# Patient Record
Sex: Female | Born: 1990 | Race: Black or African American | Hispanic: No | Marital: Single | Smoking: Never smoker
Health system: Southern US, Community
[De-identification: ages and names within clinical notes are randomized; demographics above are authoritative.]

## PROBLEM LIST (undated history)

## (undated) DIAGNOSIS — F419 Anxiety disorder, unspecified: Secondary | ICD-10-CM

## (undated) DIAGNOSIS — I890 Lymphedema, not elsewhere classified: Secondary | ICD-10-CM

## (undated) DIAGNOSIS — N83209 Unspecified ovarian cyst, unspecified side: Secondary | ICD-10-CM

## (undated) DIAGNOSIS — Z9889 Other specified postprocedural states: Secondary | ICD-10-CM

## (undated) DIAGNOSIS — Z9089 Acquired absence of other organs: Secondary | ICD-10-CM

## (undated) DIAGNOSIS — F32A Depression, unspecified: Secondary | ICD-10-CM

## (undated) HISTORY — DX: Anxiety disorder, unspecified: F41.9

## (undated) HISTORY — DX: Depression, unspecified: F32.A

## (undated) HISTORY — PX: TONSILLECTOMY: SUR1361

## (undated) HISTORY — PX: OVARIAN CYST REMOVAL: SHX89

## (undated) HISTORY — PX: REDUCTION MAMMAPLASTY: SUR839

## (undated) HISTORY — PX: BREAST REDUCTION SURGERY: SHX8

---
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
## 2011-12-31 ENCOUNTER — Ambulatory Visit: Payer: Self-pay

## 2012-09-15 ENCOUNTER — Ambulatory Visit: Payer: Self-pay | Admitting: Family Medicine

## 2012-09-15 LAB — URINALYSIS, COMPLETE
Bilirubin,UR: NEGATIVE
Glucose,UR: NEGATIVE mg/dL (ref 0–75)
Leukocyte Esterase: NEGATIVE
Nitrite: NEGATIVE
Protein: NEGATIVE
Specific Gravity: 1.01 (ref 1.003–1.030)

## 2012-09-15 LAB — WET PREP, GENITAL

## 2013-02-28 ENCOUNTER — Emergency Department: Payer: Self-pay | Admitting: Emergency Medicine

## 2013-02-28 LAB — COMPREHENSIVE METABOLIC PANEL
Alkaline Phosphatase: 90 U/L (ref 50–136)
BUN: 10 mg/dL (ref 7–18)
Bilirubin,Total: 0.3 mg/dL (ref 0.2–1.0)
Calcium, Total: 8.8 mg/dL (ref 8.5–10.1)
EGFR (African American): >60
Glucose: 92 mg/dL (ref 65–99)
Osmolality: 276 (ref 275–301)
SGOT(AST): 20 U/L (ref 15–37)
SGPT (ALT): 28 U/L (ref 12–78)

## 2013-02-28 LAB — URINALYSIS, COMPLETE
Blood: NEGATIVE
Glucose,UR: NEGATIVE mg/dL (ref 0–75)
Leukocyte Esterase: NEGATIVE
Nitrite: NEGATIVE
Ph: 8 (ref 4.5–8.0)
RBC,UR: <1 /HPF (ref 0–5)
Specific Gravity: 1.018 (ref 1.003–1.030)
Squamous Epithelial: 5
WBC UR: 6 /HPF (ref 0–5)

## 2013-02-28 LAB — CBC
HCT: 39.5 % (ref 35.0–47.0)
MCH: 30.1 pg (ref 26.0–34.0)
MCV: 92 fL (ref 80–100)
Platelet: 250 10*3/uL (ref 150–440)
RBC: 4.31 10*6/uL (ref 3.80–5.20)
RDW: 13.3 % (ref 11.5–14.5)
WBC: 10.9 10*3/uL (ref 3.6–11.0)

## 2013-02-28 LAB — LIPASE, BLOOD: Lipase: 135 U/L (ref 73–393)

## 2013-03-04 ENCOUNTER — Emergency Department: Payer: Self-pay | Admitting: Emergency Medicine

## 2013-03-04 LAB — URINALYSIS, COMPLETE
Bacteria: NONE SEEN
Glucose,UR: NEGATIVE mg/dL (ref 0–75)
RBC,UR: 277 /HPF (ref 0–5)
Specific Gravity: 1.008 (ref 1.003–1.030)
Squamous Epithelial: 2
WBC UR: 1 /HPF (ref 0–5)

## 2013-03-04 LAB — COMPREHENSIVE METABOLIC PANEL
Albumin: 3.9 g/dL (ref 3.4–5.0)
Alkaline Phosphatase: 107 U/L (ref 50–136)
Anion Gap: 4 — ABNORMAL LOW (ref 7–16)
BUN: 10 mg/dL (ref 7–18)
Bilirubin,Total: 0.3 mg/dL (ref 0.2–1.0)
Calcium, Total: 8.3 mg/dL — ABNORMAL LOW (ref 8.5–10.1)
Chloride: 105 mmol/L (ref 98–107)
Co2: 26 mmol/L (ref 21–32)
Creatinine: 0.71 mg/dL (ref 0.60–1.30)
EGFR (Non-African Amer.): >60
Glucose: 98 mg/dL (ref 65–99)
SGPT (ALT): 97 U/L — ABNORMAL HIGH (ref 12–78)
Total Protein: 8.8 g/dL — ABNORMAL HIGH (ref 6.4–8.2)

## 2013-03-04 LAB — LIPASE, BLOOD: Lipase: 184 U/L (ref 73–393)

## 2013-03-04 LAB — CBC
HCT: 41.6 % (ref 35.0–47.0)
MCH: 29.9 pg (ref 26.0–34.0)
MCV: 92 fL (ref 80–100)
Platelet: 292 10*3/uL (ref 150–440)
RBC: 4.53 10*6/uL (ref 3.80–5.20)
WBC: 13.9 10*3/uL — ABNORMAL HIGH (ref 3.6–11.0)

## 2013-03-05 LAB — PREGNANCY, URINE: Pregnancy Test, Urine: NEGATIVE m[IU]/mL

## 2014-05-07 ENCOUNTER — Ambulatory Visit: Payer: Self-pay | Admitting: Physician Assistant

## 2014-05-07 LAB — CBC WITH DIFFERENTIAL/PLATELET
Basophil #: 0.1 10*3/uL (ref 0.0–0.1)
Basophil %: 0.8 %
EOS ABS: 0.5 10*3/uL (ref 0.0–0.7)
EOS PCT: 4.4 %
HCT: 39.2 % (ref 35.0–47.0)
HGB: 12.8 g/dL (ref 12.0–16.0)
Lymphocyte #: 2.3 10*3/uL (ref 1.0–3.6)
Lymphocyte %: 21.9 %
MCH: 29.7 pg (ref 26.0–34.0)
MCHC: 32.7 g/dL (ref 32.0–36.0)
MCV: 91 fL (ref 80–100)
MONOS PCT: 7.1 %
Monocyte #: 0.8 x10 3/mm (ref 0.2–0.9)
Neutrophil #: 6.9 10*3/uL — ABNORMAL HIGH (ref 1.4–6.5)
Neutrophil %: 65.8 %
Platelet: 250 10*3/uL (ref 150–440)
RBC: 4.32 10*6/uL (ref 3.80–5.20)
RDW: 13.8 % (ref 11.5–14.5)
WBC: 10.5 10*3/uL (ref 3.6–11.0)

## 2014-05-17 ENCOUNTER — Ambulatory Visit: Payer: Self-pay | Admitting: Podiatry

## 2014-05-19 ENCOUNTER — Emergency Department: Payer: Self-pay | Admitting: Emergency Medicine

## 2014-05-19 LAB — CBC
HCT: 41.7 % (ref 35.0–47.0)
HGB: 13.3 g/dL (ref 12.0–16.0)
MCH: 29.3 pg (ref 26.0–34.0)
MCHC: 31.9 g/dL — ABNORMAL LOW (ref 32.0–36.0)
MCV: 92 fL (ref 80–100)
Platelet: 277 10*3/uL (ref 150–440)
RBC: 4.53 10*6/uL (ref 3.80–5.20)
RDW: 13.7 % (ref 11.5–14.5)
WBC: 13 10*3/uL — ABNORMAL HIGH (ref 3.6–11.0)

## 2014-05-19 LAB — BASIC METABOLIC PANEL
Anion Gap: 6 — ABNORMAL LOW (ref 7–16)
BUN: 10 mg/dL (ref 7–18)
Calcium, Total: 9.2 mg/dL (ref 8.5–10.1)
Chloride: 103 mmol/L (ref 98–107)
Co2: 28 mmol/L (ref 21–32)
Creatinine: 0.88 mg/dL (ref 0.60–1.30)
Glucose: 93 mg/dL (ref 65–99)
OSMOLALITY: 273 (ref 275–301)
POTASSIUM: 4 mmol/L (ref 3.5–5.1)
Sodium: 137 mmol/L (ref 136–145)

## 2014-05-19 LAB — D-DIMER(ARMC): D-Dimer: 185 ng/ml

## 2014-05-19 LAB — TROPONIN I: Troponin-I: <0.02 ng/mL

## 2015-02-01 IMAGING — CT CT ANGIO CHEST
2 of 6 series · 18 of 36 positions shown · IV contrast (APPLIED)
Comparison: DG CHEST 2V dated 05/19/2014

CLINICAL DATA: Chest pain, worst on the left side

EXAM:
CT ANGIOGRAPHY CHEST WITH CONTRAST
TECHNIQUE: Multidetector CT imaging of the chest was performed using the
standard protocol during bolus administration of intravenous
contrast. Multiplanar CT image reconstructions and MIPs were
obtained to evaluate the vascular anatomy.
CONTRAST:  100 cc Isovue 370 IV contrast

[Series 5: pe 1.0 thins · axial · 0.72mm/px · z∈[+796,+1020]mm · 17 of 254 slices shown]
[im 15/254  lung]
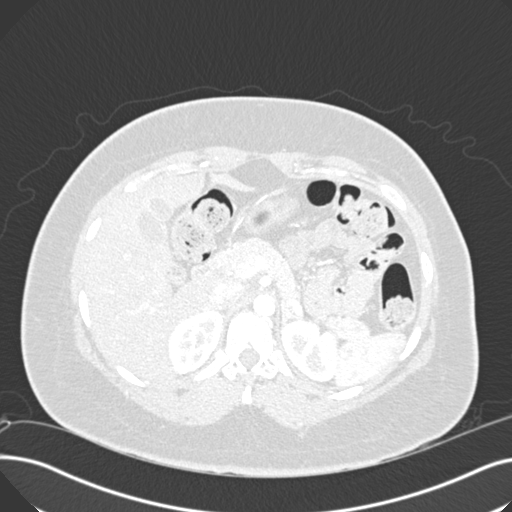
[im 29/254  mediastinal]
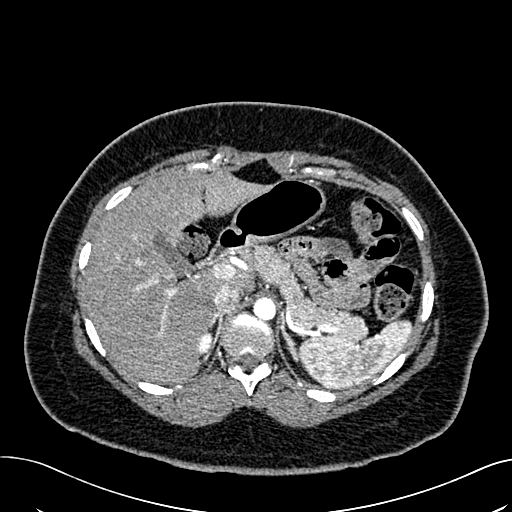
[im 43/254  lung]
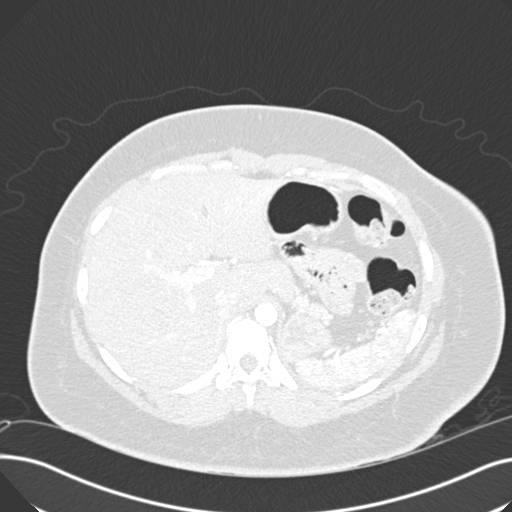
[im 57/254  mediastinal]
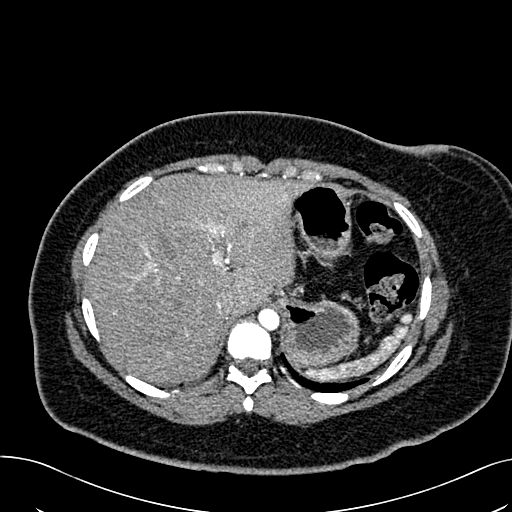
[im 71/254  lung]
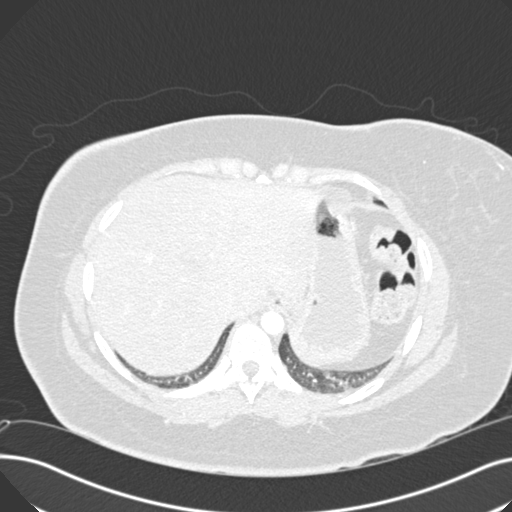
[im 85/254  mediastinal]
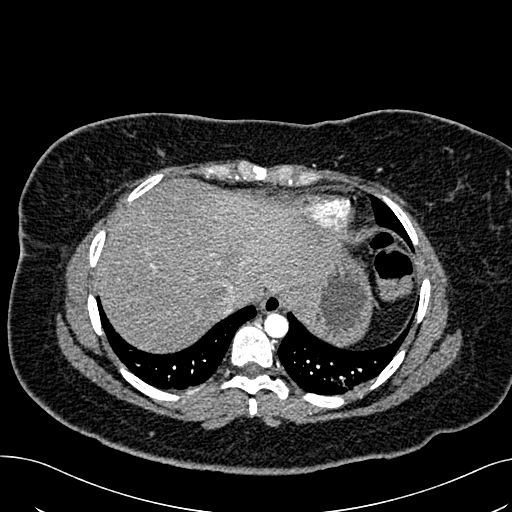
[im 99/254  lung]
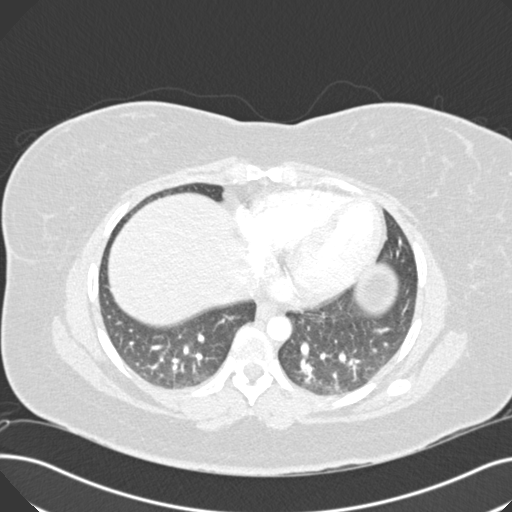
[im 113/254  mediastinal]
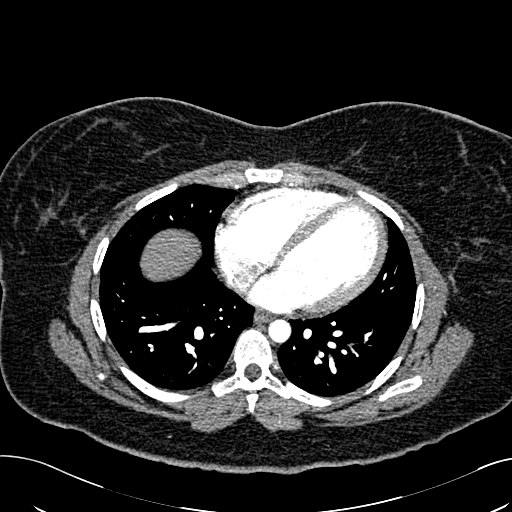
[im 127/254  lung]
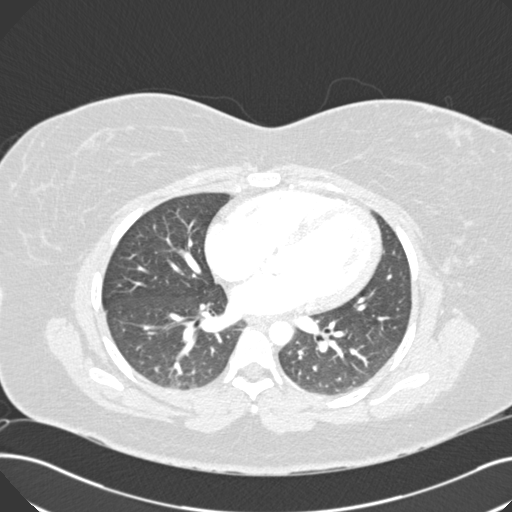
[im 141/254  mediastinal]
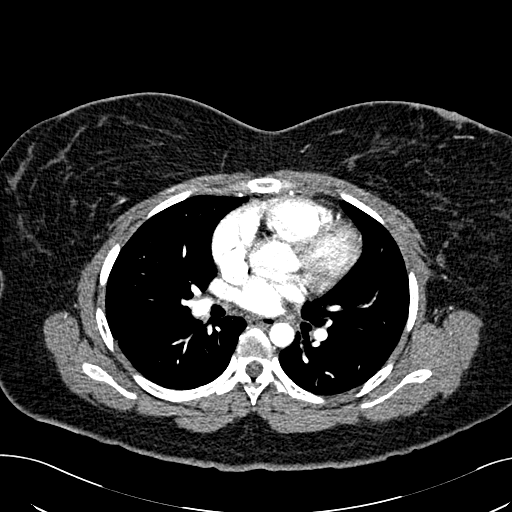
[im 155/254  lung]
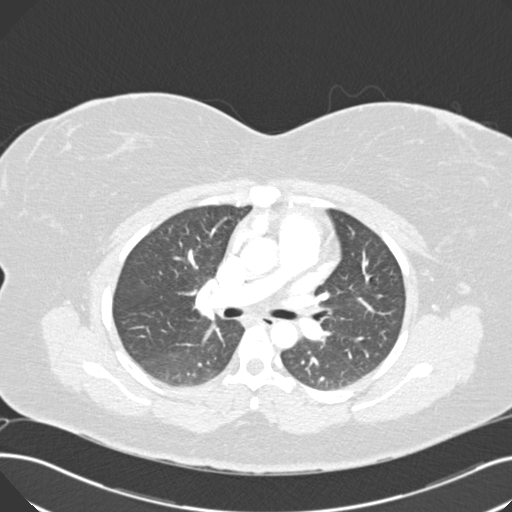
[im 169/254  mediastinal]
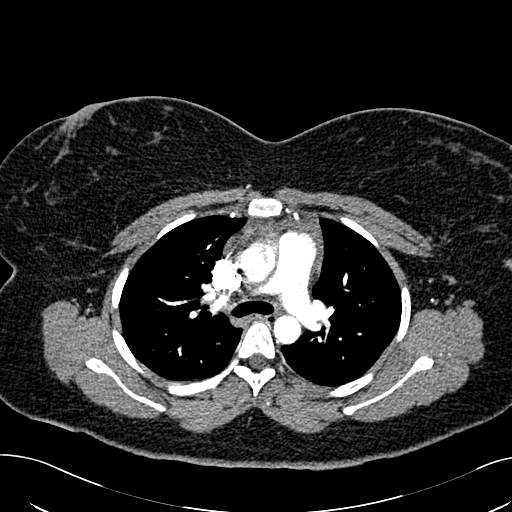
[im 183/254  lung]
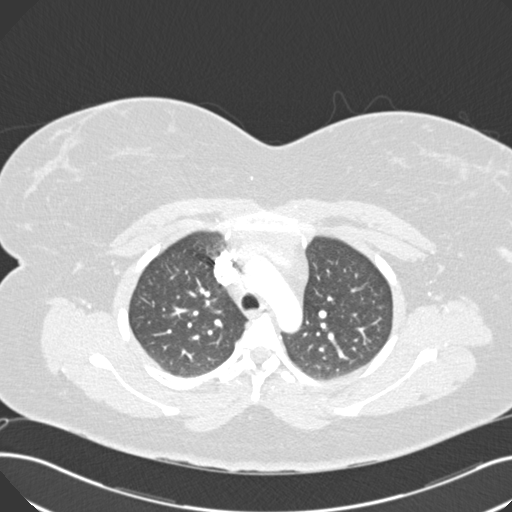
[im 197/254  mediastinal]
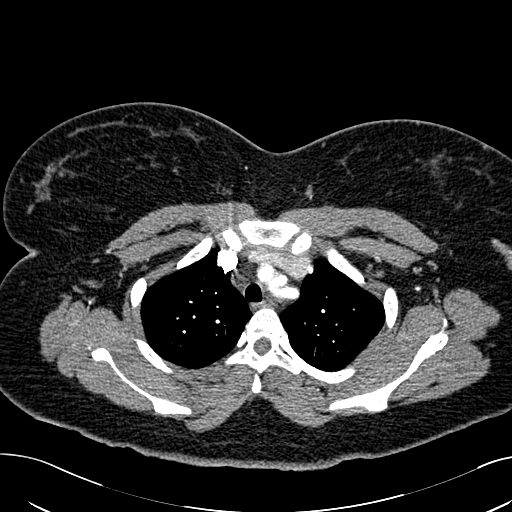
[im 211/254  lung]
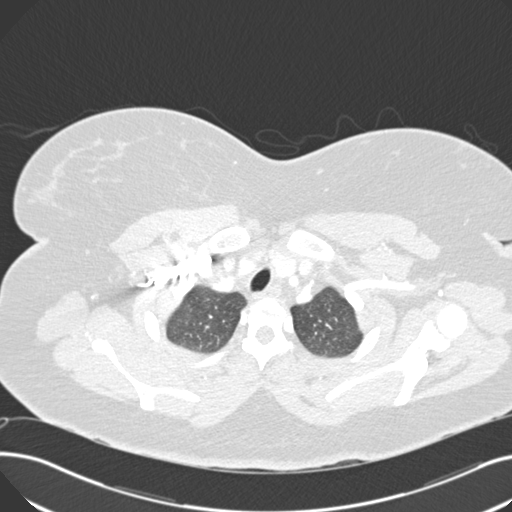
[im 225/254  mediastinal]
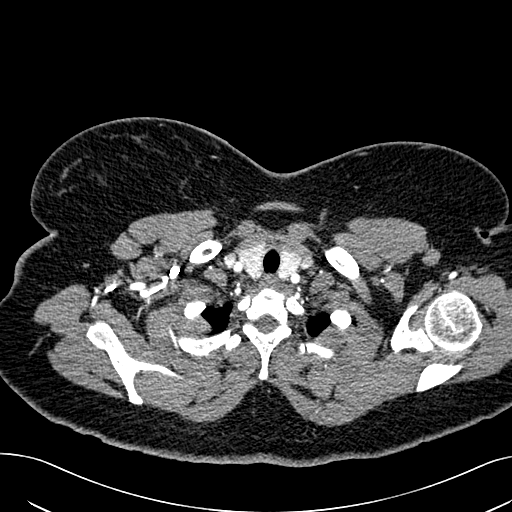
[im 239/254  lung]
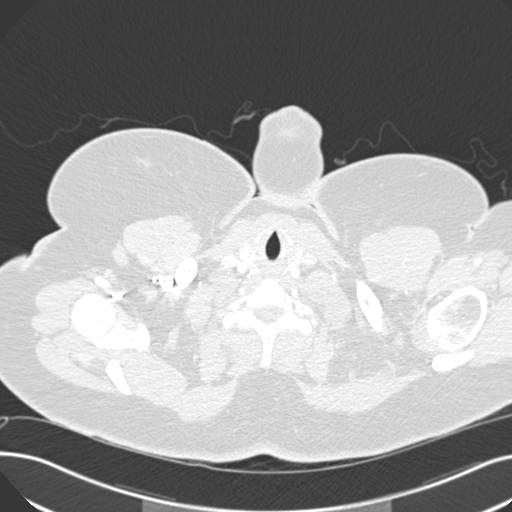

[Series 7: cor pe 2.0 mpr · coronal · 0.48mm/px · 1 of 151 slices shown]
[im 76/151  mediastinal]
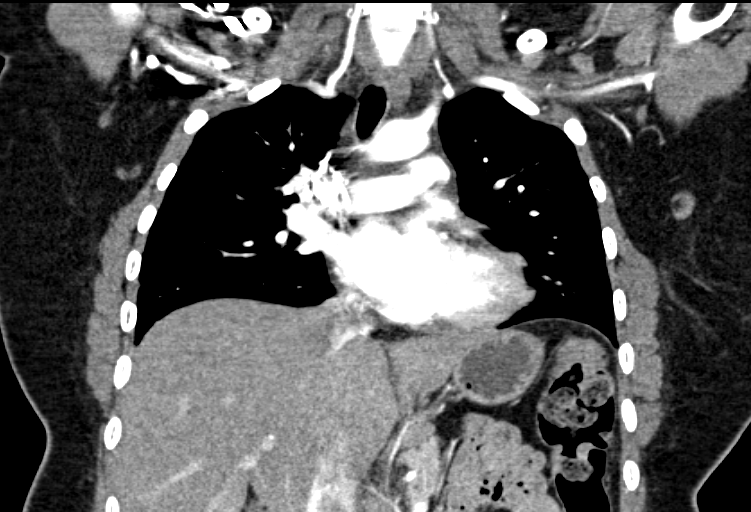

[18 of 36 positions shown; findings below may reference images not displayed]

FINDINGS: Trace fluid in the superior pericardial recess is identified.
Triangular soft tissue density within the anterior mediastinum is
compatible with residual thymus in this young patient. Heart size is
normal. No pleural effusion. No lymphadenopathy. Study is suboptimal
for evaluation for pulmonary embolism due to early bolus timing.
Allowing for this, there is no central focal filling defect
identified in either main pulmonary artery to suggest arterial
embolism.

Minimal dependent bibasilar atelectasis is noted. No consolidation,
mass, or nodule is identified. No acute osseous abnormality.

Review of the MIP images confirms the above findings.
IMPRESSION: No acute cardiopulmonary process. No CT evidence for central
pulmonary arterial filling defect to suggest pulmonary embolism.

## 2015-09-20 ENCOUNTER — Encounter: Payer: Self-pay | Admitting: Emergency Medicine

## 2015-09-20 ENCOUNTER — Ambulatory Visit
Admission: EM | Admit: 2015-09-20 | Discharge: 2015-09-20 | Disposition: A | Discharge status: Home / Self Care | Payer: Self-pay | Attending: Family Medicine | Admitting: Family Medicine

## 2015-09-20 ENCOUNTER — Ambulatory Visit
Admission: RE | Admit: 2015-09-20 | Discharge: 2015-09-20 | Disposition: A | Discharge status: Home / Self Care | Payer: Self-pay | Source: Ambulatory Visit | Attending: Family Medicine | Admitting: Family Medicine

## 2015-09-20 DIAGNOSIS — R1031 Right lower quadrant pain: Secondary | ICD-10-CM | POA: Insufficient documentation

## 2015-09-20 DIAGNOSIS — N83201 Unspecified ovarian cyst, right side: Secondary | ICD-10-CM

## 2015-09-20 DIAGNOSIS — N8320 Unspecified ovarian cysts: Secondary | ICD-10-CM

## 2015-09-20 DIAGNOSIS — E282 Polycystic ovarian syndrome: Secondary | ICD-10-CM | POA: Diagnosis present

## 2015-09-20 HISTORY — DX: Acquired absence of other organs: Z90.89

## 2015-09-20 HISTORY — DX: Unspecified ovarian cyst, unspecified side: N83.209

## 2015-09-20 HISTORY — DX: Lymphedema, not elsewhere classified: I89.0

## 2015-09-20 HISTORY — DX: Other specified postprocedural states: Z98.890

## 2015-09-20 MED ORDER — ACETAMINOPHEN 500 MG PO TABS: 1000.0000 mg | ORAL_TABLET | Freq: Four times a day (QID) | ORAL | Status: DC | PRN

## 2015-09-20 NOTE — ED Notes (Signed)
Pt with right side abd pain x 5  days

## 2015-09-20 NOTE — Discharge Instructions (Signed)
Abdominal Pain, Women  Abdominal (stomach, pelvic, or belly) pain can be caused by many things. It is important to tell your doctor:   The location of the pain.   Does it come and go or is it present all the time?   Are there things that start the pain (eating certain foods, exercise)?   Are there other symptoms associated with the pain (fever, nausea, vomiting, diarrhea)?  All of this is helpful to know when trying to find the cause of the pain.  CAUSES    Stomach: virus or bacteria infection, or ulcer.   Intestine: appendicitis (inflamed appendix), regional ileitis (Crohn's disease), ulcerative colitis (inflamed colon), irritable bowel syndrome, diverticulitis (inflamed diverticulum of the colon), or cancer of the stomach or intestine.   Gallbladder disease or stones in the gallbladder.   Kidney disease, kidney stones, or infection.   Pancreas infection or cancer.   Fibromyalgia (pain disorder).   Diseases of the female organs:   Uterus: fibroid (non-cancerous) tumors or infection.   Fallopian tubes: infection or tubal pregnancy.   Ovary: cysts or tumors.   Pelvic adhesions (scar tissue).   Endometriosis (uterus lining tissue growing in the pelvis and on the pelvic organs).   Pelvic congestion syndrome (female organs filling up with blood just before the menstrual period).   Pain with the menstrual period.   Pain with ovulation (producing an egg).   Pain with an IUD (intrauterine device, birth control) in the uterus.   Cancer of the female organs.   Functional pain (pain not caused by a disease, may improve without treatment).   Psychological pain.   Depression.  DIAGNOSIS   Your doctor will decide the seriousness of your pain by doing an examination.   Blood tests.   X-rays.   Ultrasound.   CT scan (computed tomography, special type of X-ray).   MRI (magnetic resonance imaging).   Cultures, for infection.   Barium enema (dye inserted in the large intestine, to better view it with  X-rays).   Colonoscopy (looking in intestine with a lighted tube).   Laparoscopy (minor surgery, looking in abdomen with a lighted tube).   Major abdominal exploratory surgery (looking in abdomen with a large incision).  TREATMENT   The treatment will depend on the cause of the pain.    Many cases can be observed and treated at home.   Over-the-counter medicines recommended by your caregiver.   Prescription medicine.   Antibiotics, for infection.   Birth control pills, for painful periods or for ovulation pain.   Hormone treatment, for endometriosis.   Nerve blocking injections.   Physical therapy.   Antidepressants.   Counseling with a psychologist or psychiatrist.   Minor or major surgery.  HOME CARE INSTRUCTIONS    Do not take laxatives, unless directed by your caregiver.   Take over-the-counter pain medicine only if ordered by your caregiver. Do not take aspirin because it can cause an upset stomach or bleeding.   Try a clear liquid diet (broth or water) as ordered by your caregiver. Slowly move to a bland diet, as tolerated, if the pain is related to the stomach or intestine.   Have a thermometer and take your temperature several times a day, and record it.   Bed rest and sleep, if it helps the pain.   Avoid sexual intercourse, if it causes pain.   Avoid stressful situations.   Keep your follow-up appointments and tests, as your caregiver orders.   If the pain does   not go away with medicine or surgery, you may try:   Acupuncture.   Relaxation exercises (yoga, meditation).   Group therapy.   Counseling.  SEEK MEDICAL CARE IF:    You notice certain foods cause stomach pain.   Your home care treatment is not helping your pain.   You need stronger pain medicine.   You want your IUD removed.   You feel faint or lightheaded.   You develop nausea and vomiting.   You develop a rash.   You are having side effects or an allergy to your medicine.  SEEK IMMEDIATE MEDICAL CARE IF:    Your  pain does not go away or gets worse.   You have a fever.   Your pain is felt only in portions of the abdomen. The right side could possibly be appendicitis. The left lower portion of the abdomen could be colitis or diverticulitis.   You are passing blood in your stools (bright red or black tarry stools, with or without vomiting).   You have blood in your urine.   You develop chills, with or without a fever.   You pass out.  MAKE SURE YOU:    Understand these instructions.   Will watch your condition.   Will get help right away if you are not doing well or get worse.  Document Released: 10/12/2007 Document Revised: 05/01/2014 Document Reviewed: 11/01/2009  [employer] Patient Information 2015 [employer]. This information is not intended to replace advice given to you by your health care provider. Make sure you discuss any questions you have with your health care provider.          Ovarian Cyst  An ovarian cyst is a fluid-filled sac that forms on an ovary. The ovaries are small organs that produce eggs in women. Various types of cysts can form on the ovaries. Most are not cancerous. Many do not cause problems, and they often go away on their own. Some may cause symptoms and require treatment. Common types of ovarian cysts include:   Functional cysts--These cysts may occur every month during the menstrual cycle. This is normal. The cysts usually go away with the next menstrual cycle if the woman does not get pregnant. Usually, there are no symptoms with a functional cyst.   Endometrioma cysts--These cysts form from the tissue that lines the uterus. They are also called "chocolate cysts" because they become filled with blood that turns brown. This type of cyst can cause pain in the lower abdomen during intercourse and with your menstrual period.   Cystadenoma cysts--This type develops from the cells on the outside of the ovary. These cysts can get very big and cause lower abdomen pain and pain with  intercourse. This type of cyst can twist on itself, cut off its blood supply, and cause severe pain. It can also easily rupture and cause a lot of pain.   Dermoid cysts--This type of cyst is sometimes found in both ovaries. These cysts may contain different kinds of body tissue, such as skin, teeth, hair, or cartilage. They usually do not cause symptoms unless they get very big.   Theca lutein cysts--These cysts occur when too much of a certain hormone (human chorionic gonadotropin) is produced and overstimulates the ovaries to produce an egg. This is most common after procedures used to assist with the conception of a baby (in vitro fertilization).  CAUSES    Fertility drugs can cause a condition in which multiple large cysts are formed on the   ovaries. This is called ovarian hyperstimulation syndrome.   A condition called polycystic ovary syndrome can cause hormonal imbalances that can lead to nonfunctional ovarian cysts.  SIGNS AND SYMPTOMS   Many ovarian cysts do not cause symptoms. If symptoms are present, they may include:   Pelvic pain or pressure.   Pain in the lower abdomen.   Pain during sexual intercourse.   Increasing girth (swelling) of the abdomen.   Abnormal menstrual periods.   Increasing pain with menstrual periods.   Stopping having menstrual periods without being pregnant.  DIAGNOSIS   These cysts are commonly found during a routine or annual pelvic exam. Tests may be ordered to find out more about the cyst. These tests may include:   Ultrasound.   X-ray of the pelvis.   CT scan.   MRI.   Blood tests.  TREATMENT   Many ovarian cysts go away on their own without treatment. Your health care provider may want to check your cyst regularly for 2-3 months to see if it changes. For women in menopause, it is particularly important to monitor a cyst closely because of the higher rate of ovarian cancer in menopausal women. When treatment is needed, it may include any of the following:   A  procedure to drain the cyst (aspiration). This may be done using a long needle and ultrasound. It can also be done through a laparoscopic procedure. This involves using a thin, lighted tube with a tiny camera on the end (laparoscope) inserted through a small incision.   Surgery to remove the whole cyst. This may be done using laparoscopic surgery or an open surgery involving a larger incision in the lower abdomen.   Hormone treatment or birth control pills. These methods are sometimes used to help dissolve a cyst.  HOME CARE INSTRUCTIONS    Only take over-the-counter or prescription medicines as directed by your health care provider.   Follow up with your health care provider as directed.   Get regular pelvic exams and Pap tests.  SEEK MEDICAL CARE IF:    Your periods are late, irregular, or painful, or they stop.   Your pelvic pain or abdominal pain does not go away.   Your abdomen becomes larger or swollen.   You have pressure on your bladder or trouble emptying your bladder completely.   You have pain during sexual intercourse.   You have feelings of fullness, pressure, or discomfort in your stomach.   You lose weight for no apparent reason.   You feel generally ill.   You become constipated.   You lose your appetite.   You develop acne.   You have an increase in body and facial hair.   You are gaining weight, without changing your exercise and eating habits.   You think you are pregnant.  SEEK IMMEDIATE MEDICAL CARE IF:    You have increasing abdominal pain.   You feel sick to your stomach (nauseous), and you throw up (vomit).   You develop a fever that comes on suddenly.   You have abdominal pain during a bowel movement.   Your menstrual periods become heavier than usual.  MAKE SURE YOU:   Understand these instructions.   Will watch your condition.   Will get help right away if you are not doing well or get worse.  Document Released: 12/15/2005 Document Revised: 12/20/2013 Document  Reviewed: 08/22/2013  [employer] Patient Information 2015 [employer]. This information is not intended to replace advice given to you by   your health care provider. Make sure you discuss any questions you have with your health care provider.

## 2015-09-20 NOTE — ED Provider Notes (Signed)
CSN: [id number]     Arrival date & time 09/20/15  1002 History   First MD Initiated Contact with Patient 09/20/15 1250     Chief Complaint  Patient presents with  . Abdominal Pain   (Consider location/radiation/quality/duration/timing/severity/associated sxs/prior Treatment) HPI Comments: African [place] female single here for evaluation of right lower quadrant pain started 5 days ago.  Denied changes with solid intake.  Has noticed with soda intake slight worsening pain that worsens and then relieves.  Tried ice pack, tylenol and motrin 2 doses without relief.  First episode occurred a couple months ago resolved.  Was told had ovarian cyst size of golf ball.  This weekend had diarrhea x 2 occurrences Sunday and Monday (18/19 Sep) took immodium now having constipation.  Vaginal discharge clear light Sunday 16 Sep 2015.  Left leg lymphedema since removal of left ovarian cyst age 64.  Patient reported history of PCOS.    Patient is a 24 y.o. female presenting with abdominal pain. The history is provided by the patient.  Abdominal Pain Pain location:  RLQ Pain quality: aching   Pain quality: not bloating, not burning, not cramping, not dull, no fullness, not gnawing, not heavy, no pressure, not sharp, not shooting, not squeezing, no stiffness, not tearing, not throbbing and not tugging   Pain radiates to:  Does not radiate Pain severity:  Moderate Onset quality:  Sudden Duration:  5 days Timing:  Constant Progression:  Unchanged Chronicity:  Recurrent Context: previous surgery   Context: not alcohol use, not awakening from sleep, not diet changes, not eating, not laxative use, not medication withdrawal, not recent illness, not recent sexual activity, not recent travel, not retching, not sick contacts, not suspicious food intake and not trauma   Relieved by:  Nothing Worsened by:  Palpation and movement Ineffective treatments:  Bowel activity, acetaminophen, NSAIDs, not moving, position  changes, urination, palpation, movement, lying down, eating and liquids Associated symptoms: constipation, diarrhea and vaginal discharge   Associated symptoms: no anorexia, no belching, no chest pain, no chills, no cough, no dysuria, no fatigue, no fever, no flatus, no hematemesis, no hematochezia, no hematuria, no melena, no nausea, no shortness of breath, no sore throat, no vaginal bleeding and no vomiting   Diarrhea:    Quality:  Watery   Number of occurrences:  2   Severity:  Moderate   Duration:  2 days   Timing:  Intermittent   Progression:  Resolved Vaginal discharge:    Quality:  Watery and clear   Severity:  Mild   Onset quality:  Sudden   Duration:  2 days   Timing:  Intermittent   Progression:  Resolved   Chronicity:  New Risk factors: NSAID use and obesity   Risk factors: no alcohol abuse, no aspirin use, not elderly, has not had multiple surgeries, not pregnant and no recent hospitalization     Past Medical History  Diagnosis Date  . S/P tonsillectomy   . Lymphedema of left leg   . H/O bilateral breast reduction surgery   . Ovarian cyst    Past Surgical History  Procedure Laterality Date  . Tonsillectomy    . Breast surgery    . Ovarian cyst removal     History reviewed. No pertinent family history. Social History  Substance Use Topics  . Smoking status: Never Smoker   . Smokeless tobacco: None  . Alcohol Use: No   OB History    No data available     Review of  Systems  Constitutional: Negative for fever, chills, diaphoresis, activity change, appetite change, fatigue and unexpected weight change.  HENT: Negative for congestion, dental problem, drooling, ear discharge, ear pain, facial swelling, hearing loss, sore throat, trouble swallowing and voice change.   Eyes: Negative for photophobia, pain, discharge, redness, itching and visual disturbance.  Respiratory: Negative for apnea, cough, choking, chest tightness, shortness of breath, wheezing and  stridor.   Cardiovascular: Positive for leg swelling. Negative for chest pain and palpitations.  Gastrointestinal: Positive for abdominal pain, diarrhea and constipation. Negative for nausea, vomiting, blood in stool, melena, hematochezia, abdominal distention, anal bleeding, rectal pain, anorexia, flatus and hematemesis.  Endocrine: Negative for cold intolerance and heat intolerance.  Genitourinary: Positive for vaginal discharge and pelvic pain. Negative for dysuria, urgency, frequency, hematuria, flank pain, decreased urine volume, vaginal bleeding, enuresis, difficulty urinating, vaginal pain and menstrual problem.  Musculoskeletal: Negative for myalgias, back pain, joint swelling, arthralgias, gait problem, neck pain and neck stiffness.  Skin: Negative for color change, pallor, rash and wound.  Allergic/Immunologic: Negative for environmental allergies and food allergies.  Neurological: Negative for dizziness, tremors, seizures, syncope, facial asymmetry, speech difficulty, weakness, light-headedness and headaches.  Hematological: Negative for adenopathy. Does not bruise/bleed easily.  Psychiatric/Behavioral: Negative for behavioral problems, confusion, sleep disturbance and agitation.    Allergies  Amoxicillin and Percocet  Home Medications   Prior to Admission medications   Medication Sig Start Date End Date Taking? Authorizing Provider  acetaminophen (TYLENOL) 500 MG tablet Take 2 tablets (1,000 mg total) by mouth every 6 (six) hours as needed for mild pain or moderate pain. 09/20/15   Barbaraann Barthel, NP   Meds Ordered and Administered this Visit  Medications - No data to display  BP 116/80 mmHg  Pulse 70  Temp(Src) 98.6 F (37 C) (Tympanic)  Resp 18  Ht  (1.6 m)  Wt 179 lb (81.194 kg)  BMI 31.72 kg/m2  SpO2 99%  LMP 09/06/2015 No data found.   Physical Exam  Constitutional: She is oriented to person, place, and time. Vital signs are normal. She appears  well-developed and well-nourished. No distress.  HENT:  Head: Normocephalic and atraumatic.  Right Ear: External ear normal.  Left Ear: External ear normal.  Nose: Nose normal.  Mouth/Throat: Oropharynx is clear and moist. No oropharyngeal exudate.  Eyes: Conjunctivae, EOM and lids are normal. Pupils are equal, round, and reactive to light. Right eye exhibits no discharge. Left eye exhibits no discharge. No scleral icterus.  Neck: Trachea normal and normal range of motion. Neck supple. No tracheal deviation present.  Cardiovascular: Normal rate, regular rhythm, normal heart sounds and intact distal pulses.  Exam reveals no gallop and no friction rub.   No murmur heard. Pulmonary/Chest: Effort normal and breath sounds normal. No stridor. No respiratory distress. She has no wheezes. She has no rales.  Abdominal: Soft. Bowel sounds are normal. She exhibits no shifting dullness, no distension, no pulsatile liver, no fluid wave, no abdominal bruit, no ascites, no pulsatile midline mass and no mass. There is no hepatosplenomegaly. There is tenderness in the right upper quadrant and right lower quadrant. There is no rigidity, no rebound, no guarding, no CVA tenderness, no tenderness at McBurney's point and negative Murphy's sign. Hernia confirmed negative in the ventral area, confirmed negative in the right inguinal area and confirmed negative in the left inguinal area.  Dull to percussion x 4 quads; greater tenderness RLQ than RUQ not exquisite  Musculoskeletal: Normal range of motion. She  exhibits edema. She exhibits no tenderness.       Right ankle: Normal.       Left ankle: Normal.       Right lower leg: She exhibits no tenderness, no bony tenderness, no swelling, no edema, no deformity and no laceration.       Left lower leg: She exhibits swelling. She exhibits no tenderness, no bony tenderness, no edema, no deformity and no laceration.  0-1 nonpitting edema LLE  Lymphadenopathy:    She has no  cervical adenopathy.       Right: No inguinal adenopathy present.       Left: No inguinal adenopathy present.  Neurological: She is alert and oriented to person, place, and time. She exhibits normal muscle tone. Coordination normal.  Skin: Skin is warm, dry and intact. No rash noted. She is not diaphoretic. No erythema. No pallor.  Psychiatric: She has a normal mood and affect. Her speech is normal and behavior is normal. Judgment and thought content normal. Cognition and memory are normal.  Nursing note and vitals reviewed. Patient sitting on exam table reading phone.  Position changes easily sitting, standing to lying on back for exams.   ED Course  Procedures (including critical care time)  Labs Review Labs Reviewed - No data to display  Imaging Review US Transvaginal Non-ob  09/20/2015   CLINICAL DATA:  Patient with right lower quadrant pain for 6 days. Prior oophorectomy.  EXAM: TRANSABDOMINAL AND TRANSVAGINAL ULTRASOUND OF PELVIS  TECHNIQUE: Both transabdominal and transvaginal ultrasound examinations of the pelvis were performed. Transabdominal technique was performed for global imaging of the pelvis including uterus, ovaries, adnexal regions, and pelvic cul-de-sac. It was necessary to proceed with endovaginal exam following the transabdominal exam to visualize the adnexal structures.  COMPARISON:  CT abdomen pelvis 03/05/2013  FINDINGS: Uterus  Measurements: 9.2 x 4.2 x 5.3 cm. No fibroids or other mass visualized.  Endometrium  Thickness: 14 mm.  No focal abnormality visualized.  Right ovary  Measurements: 4.8 x 2.1 x 3.0 cm. Probable corpus luteum measuring 1.4 cm within the right ovary.  Left ovary  Surgically absent.  Other findings  Trace fluid within the right adnexa.  IMPRESSION: Probable corpus luteum within the right ovary. Small amount of right adnexal free fluid.   Electronically Signed   By: Annia Belt M.D.   On: 09/20/2015 15:48   US Pelvis Complete  09/20/2015   CLINICAL  DATA:  Patient with right lower quadrant pain for 6 days. Prior oophorectomy.  EXAM: TRANSABDOMINAL AND TRANSVAGINAL ULTRASOUND OF PELVIS  TECHNIQUE: Both transabdominal and transvaginal ultrasound examinations of the pelvis were performed. Transabdominal technique was performed for global imaging of the pelvis including uterus, ovaries, adnexal regions, and pelvic cul-de-sac. It was necessary to proceed with endovaginal exam following the transabdominal exam to visualize the adnexal structures.  COMPARISON:  CT abdomen pelvis 03/05/2013  FINDINGS: Uterus  Measurements: 9.2 x 4.2 x 5.3 cm. No fibroids or other mass visualized.  Endometrium  Thickness: 14 mm.  No focal abnormality visualized.  Right ovary  Measurements: 4.8 x 2.1 x 3.0 cm. Probable corpus luteum measuring 1.4 cm within the right ovary.  Left ovary  Surgically absent.  Other findings  Trace fluid within the right adnexa.  IMPRESSION: Probable corpus luteum within the right ovary. Small amount of right adnexal free fluid.   Electronically Signed   By: Annia Belt M.D.   On: 09/20/2015 15:48   1610 Contacted by radiology [place] tech with  results for patient scan.  Discussed results with patient via telephone.  Continue plan of care as previously discussed.  Tylenol  po QID prn pain.  Heat/ice pack prn.  Patient to follow up with [facility] or Gyn for routine care.  Consider birth control pills/hormonal birth control to help with PCOS as recurrent ovarian cysts per patient verbal medical history starting age 89.  ER if vaginal bleeding, hematemesis, worsening abdomen pain, hematochezia, hematuria, fever greater than 100.5  Discussed ovulation typically two weeks prior to menses.  Patient verbalized understanding of information/instructions, agreed with plan of care and had no further questions at this time.   MDM   1. Right lower quadrant abdominal pain   2. RLQ abdominal pain   3. Cyst of right ovary    Patient refused pregnancy test, CBC today.   Requested [place].  Scheduled for 1430 today at [facility] as no [place] tech available [place] facility today/within 24 hours.  Denied sexual activity >30 days.  LMP 06 Sep 2015.  Negative for fever.  Work excuse note for yesterday, today and tomorrow given to patient.  Avoid heavy lifting or strenuous activity.  Discussed with patient differential diagnosis infection, ectopic pregnancy, cancer, ovarian cyst, appendicitis, cystitis.  Exitcare handout on abdomen pain and ovarian cyst given to patient.  Tylenol  po QID prn pain.  Ice/hot packs prn comfort.  Stop immodium use. Increase fluid/fiber intake to help with constipation.  ER if vaginal bleeding, worsening abdomen pain, hematemesis, hematochezia black or red, hematuria, fever greater than 100.5.  Patient verbalized understanding of information/instructions, agreed with plan of care and had no further questions at this time.    Barbaraann Barthel, NP 09/20/15 1949

## 2015-11-10 ENCOUNTER — Encounter: Payer: Self-pay | Admitting: Internal Medicine

## 2015-11-10 DIAGNOSIS — D6800 Von Willebrand disease, unspecified: Secondary | ICD-10-CM | POA: Insufficient documentation

## 2016-01-10 ENCOUNTER — Encounter: Payer: Self-pay | Admitting: Obstetrics and Gynecology

## 2016-10-17 ENCOUNTER — Encounter: Payer: Self-pay | Admitting: Internal Medicine

## 2016-10-17 DIAGNOSIS — K769 Liver disease, unspecified: Secondary | ICD-10-CM | POA: Insufficient documentation

## 2016-10-20 ENCOUNTER — Encounter: Payer: Self-pay | Admitting: Internal Medicine

## 2016-10-20 ENCOUNTER — Ambulatory Visit (INDEPENDENT_AMBULATORY_CARE_PROVIDER_SITE_OTHER): Payer: Self-pay | Admitting: Internal Medicine

## 2016-10-20 VITALS — BP 122/82 | HR 85 | Resp 16 | Ht 63.0 in | Wt 182.0 lb

## 2016-10-20 DIAGNOSIS — R102 Pelvic and perineal pain: Secondary | ICD-10-CM

## 2016-10-20 DIAGNOSIS — K769 Liver disease, unspecified: Secondary | ICD-10-CM

## 2016-10-20 DIAGNOSIS — K7689 Other specified diseases of liver: Secondary | ICD-10-CM

## 2016-10-20 MED ORDER — MELOXICAM 15 MG PO TABS: 15.0000 mg | ORAL_TABLET | Freq: Every day | ORAL | 0 refills | Status: DC

## 2016-10-20 NOTE — Progress Notes (Signed)
Date:  10/20/2016   Name:  Mallory Johnston   DOB:  1991/12/14   MRN:  [id number]   Chief Complaint: Abdominal Pain (Morphine amd Zofran and Dilaudid in ER now Using moms essential oils instead of meds ER gave. Can not eat causes nausea.Follow up ER 10/15/2016 lesion on liver found on CT while looking for cyst on ovary. )  Abdominal Pain  This is a new problem. The current episode started in the past 7 days. The onset quality is sudden. The problem occurs constantly. The problem has been gradually improving. The pain is located in the suprapubic region. The pain is at a severity of 4/10. The pain is mild. The quality of the pain is colicky and aching. Associated symptoms include nausea. Pertinent negatives include no diarrhea, fever, frequency, headaches, hematuria or vomiting. The pain is aggravated by bowel movement and certain positions. PCOS  She is using essential oils rather than reglan and bendryl ordered by ER. She is not taking any nsaids due to being told years ago that she had Von Willibrand's disease and could not take these.  She denies any nose bleeds or other abnormal bleeding. ER CT showed a right lobe liver lesion 1.2 cm which needs follow up.  Review of Systems  Constitutional: Negative for chills, fatigue and fever.  HENT: Negative for nosebleeds.   Respiratory: Negative for cough, chest tightness, shortness of breath and wheezing.   Cardiovascular: Negative for chest pain, palpitations and leg swelling.  Gastrointestinal: Positive for abdominal pain and nausea. Negative for diarrhea and vomiting.  Genitourinary: Negative for frequency and hematuria.  Skin: Negative for rash.  Neurological: Negative for headaches.    Patient Active Problem List   Diagnosis Date Noted  . Liver lesion, right lobe 10/17/2016  . Von Willebrand disease (HCC) 11/10/2015  . PCOS (polycystic ovarian syndrome) 09/20/2015    Prior to Admission medications   Not on File    Allergies    Allergen Reactions  . Amoxicillin Rash  . Percocet [Oxycodone-Acetaminophen] Rash    Past Surgical History:  Procedure Laterality Date  . BREAST REDUCTION SURGERY    . OVARIAN CYST REMOVAL    . TONSILLECTOMY      Social History  Substance Use Topics  . Smoking status: Never Smoker  . Smokeless tobacco: Never Used  . Alcohol use No     Medication list has been reviewed and updated.   Physical Exam  Constitutional: She is oriented to person, place, and time. She appears well-developed. No distress.  HENT:  Head: Normocephalic and atraumatic.  Cardiovascular: Normal rate, regular rhythm and normal heart sounds.   Pulmonary/Chest: Effort normal and breath sounds normal. No respiratory distress.  Abdominal: Soft. Normal appearance. Bowel sounds are decreased. There is tenderness in the suprapubic area. There is no rigidity, no guarding and no CVA tenderness.  Musculoskeletal: Normal range of motion.  Neurological: She is alert and oriented to person, place, and time.  Skin: Skin is warm and dry. No rash noted.  Psychiatric: She has a normal mood and affect. Her behavior is normal. Thought content normal.  Nursing note and vitals reviewed.   BP 122/82   Pulse 85   Resp 16   Ht 5\' 3"  (1.6 m)   Wt 182 lb (82.6 kg)   LMP 10/06/2016 (Exact Date)   SpO2 99%   BMI 32.24 kg/m   Assessment and Plan: 1. Pelvic pain Probably due to ruptured ovarian cyst (free fluid seen on CT but  US negative) Begin nsaids briefly with food (5-7 days) Follow up with GYN of choice - meloxicam (MOBIC) 15 MG tablet; Take 1 tablet (15 mg total) by mouth daily. For pelvic pain  Dispense: 30 tablet; Refill: 0  2. Liver lesion, right lobe - MR Abdomen W Wo Contrast; Future   Bari EdwardLaura Cheralyn Oliver, MD [facility]  10/20/2016

## 2016-10-22 ENCOUNTER — Ambulatory Visit: Payer: Self-pay

## 2016-11-03 ENCOUNTER — Ambulatory Visit
Admission: RE | Admit: 2016-11-03 | Discharge: 2016-11-03 | Disposition: A | Discharge status: Home / Self Care | Payer: Self-pay | Source: Ambulatory Visit | Attending: Internal Medicine | Admitting: Internal Medicine

## 2016-11-03 DIAGNOSIS — K7689 Other specified diseases of liver: Secondary | ICD-10-CM | POA: Insufficient documentation

## 2016-11-03 DIAGNOSIS — K769 Liver disease, unspecified: Secondary | ICD-10-CM

## 2016-11-03 MED ORDER — GADOBENATE DIMEGLUMINE 529 MG/ML IV SOLN
18.0000 mL | Freq: Once | INTRAVENOUS | Status: AC | PRN
  Administered 2016-11-03: 18 mL via INTRAVENOUS

## 2017-02-11 ENCOUNTER — Encounter: Payer: Self-pay | Admitting: Obstetrics and Gynecology

## 2017-02-11 ENCOUNTER — Ambulatory Visit (INDEPENDENT_AMBULATORY_CARE_PROVIDER_SITE_OTHER): Payer: BLUE CROSS/BLUE SHIELD | Admitting: Obstetrics and Gynecology

## 2017-02-11 VITALS — BP 113/81 | HR 77 | Ht 63.0 in | Wt 181.3 lb

## 2017-02-11 DIAGNOSIS — Z6830 Body mass index (BMI) 30.0-30.9, adult: Secondary | ICD-10-CM | POA: Insufficient documentation

## 2017-02-11 DIAGNOSIS — D6800 Von Willebrand disease, unspecified: Secondary | ICD-10-CM

## 2017-02-11 DIAGNOSIS — Z3041 Encounter for surveillance of contraceptive pills: Secondary | ICD-10-CM | POA: Diagnosis not present

## 2017-02-11 DIAGNOSIS — Z202 Contact with and (suspected) exposure to infections with a predominantly sexual mode of transmission: Secondary | ICD-10-CM | POA: Diagnosis not present

## 2017-02-11 DIAGNOSIS — N83201 Unspecified ovarian cyst, right side: Secondary | ICD-10-CM | POA: Diagnosis not present

## 2017-02-11 DIAGNOSIS — R102 Pelvic and perineal pain: Secondary | ICD-10-CM | POA: Diagnosis not present

## 2017-02-11 DIAGNOSIS — N949 Unspecified condition associated with female genital organs and menstrual cycle: Secondary | ICD-10-CM

## 2017-02-11 DIAGNOSIS — E6609 Other obesity due to excess calories: Secondary | ICD-10-CM

## 2017-02-11 DIAGNOSIS — Z87410 Personal history of cervical dysplasia: Secondary | ICD-10-CM | POA: Diagnosis not present

## 2017-02-11 MED ORDER — NORETHIN ACE-ETH ESTRAD-FE 1.5-30 MG-MCG PO TABS: 1.0000 | ORAL_TABLET | Freq: Every day | ORAL | 11 refills | Status: DC

## 2017-02-11 NOTE — Progress Notes (Signed)
GYN ENCOUNTER NOTE  Subjective:       Mallory Johnston is a 26 y.o. G25P0010 female is here for gynecologic evaluation of the following issues:  1. Right ovarian cyst  26 year old African-American female, using nothing at this time for contraception, previously on Junel 1/20 0.5/30 for contraception, cycle regulation to control menorrhagia, and suppression of ovarian cysts, presents for evaluation of recurrent right lower quadrant pain. Workup in October 2017 at [facility] demonstrated an MRI which showed a 3.1 cm ovarian cyst on the right area; initial CT scan in the emergency room did not reveal the cyst, as it was thought to have been ruptured. The later MRI was performed in order to evaluate a liver lesion, and the incidental finding of the cyst was noted   Over the past week the patient has noted increased right lower quadrant discomfort with an achiness as well as sharp stabbing pains. She has not experienced any significant abnormal uterine bleeding. She has not been sexually active within the past 6 months. The patient denies vaginal discharge.  Significant health issues include history of von Willebrand's disease. Prior to 2016 she has been worked up for complex ovarian cysts for which she was placed on oral contraceptives to help control the recurrent lesions. . Gynecologic History No LMP recorded. 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 H/O bilateral breast reduction surgery   . Lymphedema of left leg   . Ovarian  cyst   . S/P tonsillectomy     Past Surgical History:  Procedure Laterality Date  . BREAST REDUCTION SURGERY    . OVARIAN CYST REMOVAL    . TONSILLECTOMY      Current Outpatient Prescriptions on File Prior to Visit  Medication Sig Dispense Refill  . meloxicam (MOBIC) 15 MG tablet Take 1 tablet (15 mg total) by mouth daily. For pelvic pain 30 tablet 0   No current facility-administered medications on file prior to visit.     Allergies  Allergen Reactions  . Amoxicillin Rash  . Percocet [Oxycodone-Acetaminophen] Rash    Social History   Social History  . Marital status: Single    Spouse name: N/A  . Number of children: N/A  . Years of education: N/A   Occupational History  . Not on file.   Social History Main Topics  . Smoking status: Never Smoker  . Smokeless tobacco: Never Used  . Alcohol use No  . Drug use: No  . Sexual activity: Not Currently   Other Topics Concern  . Not on file   Social History Narrative  . No narrative on file    Family History  Problem Relation Age of Onset  . Hypertension Father   . Diabetes Mother   . Breast cancer Neg Hx   . Ovarian cancer Neg Hx   . Colon cancer Neg Hx     The following portions of  the patient's history were reviewed and updated as appropriate: allergies, current medications, past family history, past medical history, past social history, past surgical history and problem list.  Review of Systems Review of Systems - General ROS: negative for - chills, fatigue, fever, hot flashes, malaise or night sweats Hematological and Lymphatic ROS: negative for - bleeding problems or swollen lymph nodes Gastrointestinal ROS: negative for - abdominal pain, blood in stools, change in bowel habits and nausea/vomiting Musculoskeletal ROS: negative for - joint pain, muscle pain or muscular weakness Genito-Urinary ROS: negative for - change in menstrual cycle; POSITIVE-right lower quadrant and central cramping pelvic pain; no  UTI symptoms; no vaginal discharge  Objective:   BP 113/81   Pulse 77   Ht 5\' 3"  (1.6 m)   Wt 181 lb 4.8 oz (82.2 kg)   BMI 32.12 kg/m  CONSTITUTIONAL: Well-developed, well-nourished female in no acute distress.  HENT:  Normocephalic, atraumatic.  NECK: Normal range of motion, supple, no masses.  Normal thyroid.  SKIN: Skin is warm and dry. No rash noted. Not diaphoretic. No erythema. No pallor. NEUROLGIC: Alert and oriented to person, place, and time. PSYCHIATRIC: Normal mood and affect. Normal behavior. Normal judgment and thought content. CARDIOVASCULAR:Not Examined RESPIRATORY: Not Examined BREASTS: Not Examined BACK: No CVA tenderness ABDOMEN: Soft, non distended; Non tender.  No Organomegaly. PELVIC:  External Genitalia: Normal  BUS: Normal  Vagina: Normal  Cervix: Normal; minimal cervical motion tenderness  Uterus: Mid plane, normal size and shape, mobile, tender 1/4  Adnexa: Nonpalpable bilaterally; 1/4 tenderness right greater than left  RV: Normal external exam  Bladder: Nontender MUSCULOSKELETAL: Normal range of motion. No tenderness.  No cyanosis, clubbing, or edema.     Assessment:   1. Pelvic pain - US Pelvis Complete; Future - US Transvaginal Non-OB; Future  2. Encounter for surveillance of contraceptive pills-Discontinue medications because of loss of insurance; desires to go back on oral contraceptives  3. Ovarian cyst, right-recently found on MRI; history of complex right ovarian cyst, previously controlled with oral contraceptive cycling - US Pelvis Complete; Future - US Transvaginal Non-OB; Future  4. Uterine tenderness-cannot rule out STD versus endometriosis/adenomyosis  5. History of cervical dysplasia-no Pap smear screening since 2016  6. Possible exposure to STD-recommend GC/chlamydia NAAT  7. Class 1 obesity due to excess calories without serious comorbidity with body mass index (BMI) of 30.0 to 30.9 in adult  8. Von Willebrand's disease  (HCC)     Plan:   1. GC/CT NAAT 2. Continue taking Tylenol for pain 3. Pelvic ultrasound 4. Restart Junel 1.05/27/29 oral contraceptives at the beginning of next cycle 5. Return in 3 months for annual exam and follow-up 6. Results from ultrasound will be made available  A total of 25 minutes were spent face-to-face with the patient during this encounter and over half of that time involved counseling and coordination of care.  Herold HarmsMartin A Keilany Burnette, MD  Note: This dictation was prepared with Dragon dictation along with smaller phrase technology. Any transcriptional errors that result from this process are unintentional.

## 2017-02-11 NOTE — Patient Instructions (Signed)
1. Take Tylenol for pelvic pain 2. Ultrasound is scheduled to assess pelvic pain 3. Begin Junel 1/20 0.5/30 oral contraceptives for birth control, cycle regulation, and contraception 4. Return in 3 months for follow-up and annual exam

## 2017-02-12 ENCOUNTER — Telehealth: Payer: Self-pay | Admitting: Obstetrics and Gynecology

## 2017-02-12 NOTE — Telephone Encounter (Signed)
Pt called for Urine results .

## 2017-02-12 NOTE — Telephone Encounter (Signed)
Pt aware results take 48-72 hours to come back. Will contact when results are in.

## 2017-02-13 LAB — GC/CHLAMYDIA PROBE AMP
Chlamydia trachomatis, NAA: NEGATIVE
NEISSERIA GONORRHOEAE BY PCR: NEGATIVE

## 2017-02-18 ENCOUNTER — Ambulatory Visit (INDEPENDENT_AMBULATORY_CARE_PROVIDER_SITE_OTHER): Payer: BLUE CROSS/BLUE SHIELD

## 2017-02-18 DIAGNOSIS — N83201 Unspecified ovarian cyst, right side: Secondary | ICD-10-CM | POA: Diagnosis not present

## 2017-02-18 DIAGNOSIS — R102 Pelvic and perineal pain: Secondary | ICD-10-CM | POA: Diagnosis not present

## 2017-02-20 ENCOUNTER — Telehealth: Payer: Self-pay | Admitting: Obstetrics and Gynecology

## 2017-02-20 NOTE — Telephone Encounter (Signed)
PT CALLED AND IS REQ A CALL BACK FROM YOU. °

## 2017-02-20 NOTE — Telephone Encounter (Signed)
lmtrc

## 2017-02-20 NOTE — Telephone Encounter (Signed)
Pt states since starting her periods 2 days ago she has been nauseous and an increase in pelvic pain. Pos bm. No uti sx. Mad reviewed u/s - wnl. Tried tylenol which helps some. Advised ibup 800 tid. May add tyelnol q 4h. If pt develops fever, severe abn pain or heavy vaginal bleeding. She is to contact office or go to the er. Pt will contact me for an appt if no better soon. Also reminded pt to take ocp on Sunday which may help pp.

## 2017-02-23 DIAGNOSIS — N739 Female pelvic inflammatory disease, unspecified: Secondary | ICD-10-CM | POA: Diagnosis not present

## 2017-02-23 DIAGNOSIS — R1030 Lower abdominal pain, unspecified: Secondary | ICD-10-CM | POA: Diagnosis not present

## 2017-02-23 DIAGNOSIS — Z885 Allergy status to narcotic agent status: Secondary | ICD-10-CM | POA: Diagnosis not present

## 2017-02-23 DIAGNOSIS — R1031 Right lower quadrant pain: Secondary | ICD-10-CM | POA: Diagnosis not present

## 2017-02-23 DIAGNOSIS — R1032 Left lower quadrant pain: Secondary | ICD-10-CM | POA: Diagnosis not present

## 2017-02-23 DIAGNOSIS — R6883 Chills (without fever): Secondary | ICD-10-CM | POA: Diagnosis not present

## 2017-02-23 DIAGNOSIS — R112 Nausea with vomiting, unspecified: Secondary | ICD-10-CM | POA: Diagnosis not present

## 2017-05-13 ENCOUNTER — Encounter: Payer: BLUE CROSS/BLUE SHIELD | Admitting: Obstetrics and Gynecology

## 2017-05-21 ENCOUNTER — Encounter: Payer: Self-pay | Admitting: Obstetrics and Gynecology

## 2017-05-21 ENCOUNTER — Ambulatory Visit (INDEPENDENT_AMBULATORY_CARE_PROVIDER_SITE_OTHER): Payer: BLUE CROSS/BLUE SHIELD | Admitting: Obstetrics and Gynecology

## 2017-05-21 VITALS — BP 115/73 | HR 90 | Ht 63.0 in | Wt 189.3 lb

## 2017-05-21 DIAGNOSIS — Z87410 Personal history of cervical dysplasia: Secondary | ICD-10-CM

## 2017-05-21 DIAGNOSIS — D6800 Von Willebrand disease, unspecified: Secondary | ICD-10-CM

## 2017-05-21 DIAGNOSIS — E6609 Other obesity due to excess calories: Secondary | ICD-10-CM | POA: Diagnosis not present

## 2017-05-21 DIAGNOSIS — Z01419 Encounter for gynecological examination (general) (routine) without abnormal findings: Secondary | ICD-10-CM

## 2017-05-21 DIAGNOSIS — Z6830 Body mass index (BMI) 30.0-30.9, adult: Secondary | ICD-10-CM | POA: Diagnosis not present

## 2017-05-21 DIAGNOSIS — E66811 Obesity, class 1: Secondary | ICD-10-CM

## 2017-05-21 MED ORDER — NORETHIN ACE-ETH ESTRAD-FE 1.5-30 MG-MCG PO TABS: 1.0000 | ORAL_TABLET | Freq: Every day | ORAL | 3 refills | Status: DC

## 2017-05-21 NOTE — Patient Instructions (Signed)
1. Pap smear is done 2. Self breast awareness is encouraged 3. Screening labs are to be obtained through primary care 4. Safe sex practices encouraged 5. Birth control pills are refilled for 1 year 6. Return in 1 year for annual exam  Health Maintenance, Female Adopting a healthy lifestyle and getting preventive care can go a long way to promote health and wellness. Talk with your health care provider about what schedule of regular examinations is right for you. This is a good chance for you to check in with your provider about disease prevention and staying healthy. In between checkups, there are plenty of things you can do on your own. Experts have done a lot of research about which lifestyle changes and preventive measures are most likely to keep you healthy. Ask your health care provider for more information. Weight and diet Eat a healthy diet  Be sure to include plenty of vegetables, fruits, low-fat dairy products, and lean protein.  Do not eat a lot of foods high in solid fats, added sugars, or salt.  Get regular exercise. This is one of the most important things you can do for your health.  Most adults should exercise for at least 150 minutes each week. The exercise should increase your heart rate and make you sweat (moderate-intensity exercise).  Most adults should also do strengthening exercises at least twice a week. This is in addition to the moderate-intensity exercise. Maintain a healthy weight  Body mass index (BMI) is a measurement that can be used to identify possible weight problems. It estimates body fat based on height and weight. Your health care provider can help determine your BMI and help you achieve or maintain a healthy weight.  For females 42 years of age and older:  A BMI below 18.5 is considered underweight.  A BMI of 18.5 to 24.9 is normal.  A BMI of 25 to 29.9 is considered overweight.  A BMI of 30 and above is considered obese. Watch levels of  cholesterol and blood lipids  You should start having your blood tested for lipids and cholesterol at 28 years of age, then have this test every 5 years.  You may need to have your cholesterol levels checked more often if:  Your lipid or cholesterol levels are high.  You are older than 28 years of age.  You are at high risk for heart disease. Cancer screening Lung Cancer  Lung cancer screening is recommended for adults 12-32 years old who are at high risk for lung cancer because of a history of smoking.  A yearly low-dose CT scan of the lungs is recommended for people who:  Currently smoke.  Have quit within the past 15 years.  Have at least a 30-pack-year history of smoking. A pack year is smoking an average of one pack of cigarettes a day for 1 year.  Yearly screening should continue until it has been 15 years since you quit.  Yearly screening should stop if you develop a health problem that would prevent you from having lung cancer treatment. Breast Cancer  Practice breast self-awareness. This means understanding how your breasts normally appear and feel.  It also means doing regular breast self-exams. Let your health care provider know about any changes, no matter how small.  If you are in your 20s or 30s, you should have a clinical breast exam (CBE) by a health care provider every 1-3 years as part of a regular health exam.  If you are 40 or older,  have a CBE every year. Also consider having a breast X-ray (mammogram) every year.  If you have a family history of breast cancer, talk to your health care provider about genetic screening.  If you are at high risk for breast cancer, talk to your health care provider about having an MRI and a mammogram every year.  Breast cancer gene (BRCA) assessment is recommended for women who have family members with BRCA-related cancers. BRCA-related cancers include:  Breast.  Ovarian.  Tubal.  Peritoneal cancers.  Results of  the assessment will determine the need for genetic counseling and BRCA1 and BRCA2 testing. Cervical Cancer  Your health care provider may recommend that you be screened regularly for cancer of the pelvic organs (ovaries, uterus, and vagina). This screening involves a pelvic examination, including checking for microscopic changes to the surface of your cervix (Pap test). You may be encouraged to have this screening done every 3 years, beginning at age 74.  For women ages 83-65, health care providers may recommend pelvic exams and Pap testing every 3 years, or they may recommend the Pap and pelvic exam, combined with testing for human papilloma virus (HPV), every 5 years. Some types of HPV increase your risk of cervical cancer. Testing for HPV may also be done on women of any age with unclear Pap test results.  Other health care providers may not recommend any screening for nonpregnant women who are considered low risk for pelvic cancer and who do not have symptoms. Ask your health care provider if a screening pelvic exam is right for you.  If you have had past treatment for cervical cancer or a condition that could lead to cancer, you need Pap tests and screening for cancer for at least 20 years after your treatment. If Pap tests have been discontinued, your risk factors (such as having a new sexual partner) need to be reassessed to determine if screening should resume. Some women have medical problems that increase the chance of getting cervical cancer. In these cases, your health care provider may recommend more frequent screening and Pap tests. Colorectal Cancer  This type of cancer can be detected and often prevented.  Routine colorectal cancer screening usually begins at 28 years of age and continues through 28 years of age.  Your health care provider may recommend screening at an earlier age if you have risk factors for colon cancer.  Your health care provider may also recommend using home test  kits to check for hidden blood in the stool.  A small camera at the end of a tube can be used to examine your colon directly (sigmoidoscopy or colonoscopy). This is done to check for the earliest forms of colorectal cancer.  Routine screening usually begins at age 61.  Direct examination of the colon should be repeated every 5-10 years through 28 years of age. However, you may need to be screened more often if early forms of precancerous polyps or small growths are found. Skin Cancer  Check your skin from head to toe regularly.  Tell your health care provider about any new moles or changes in moles, especially if there is a change in a mole's shape or color.  Also tell your health care provider if you have a mole that is larger than the size of a pencil eraser.  Always use sunscreen. Apply sunscreen liberally and repeatedly throughout the day.  Protect yourself by wearing long sleeves, pants, a wide-brimmed hat, and sunglasses whenever you are outside. Heart disease, diabetes,  and high blood pressure  High blood pressure causes heart disease and increases the risk of stroke. High blood pressure is more likely to develop in:  People who have blood pressure in the high end of the normal range (130-139/85-89 mm Hg).  People who are overweight or obese.  People who are African American.  If you are 64-61 years of age, have your blood pressure checked every 3-5 years. If you are 16 years of age or older, have your blood pressure checked every year. You should have your blood pressure measured twice-once when you are at a hospital or clinic, and once when you are not at a hospital or clinic. Record the average of the two measurements. To check your blood pressure when you are not at a hospital or clinic, you can use:  An automated blood pressure machine at a pharmacy.  A home blood pressure monitor.  If you are between 52 years and 68 years old, ask your health care provider if you should  take aspirin to prevent strokes.  Have regular diabetes screenings. This involves taking a blood sample to check your fasting blood sugar level.  If you are at a normal weight and have a low risk for diabetes, have this test once every three years after 28 years of age.  If you are overweight and have a high risk for diabetes, consider being tested at a younger age or more often. Preventing infection Hepatitis B  If you have a higher risk for hepatitis B, you should be screened for this virus. You are considered at high risk for hepatitis B if:  You were born in a country where hepatitis B is common. Ask your health care provider which countries are considered high risk.  Your parents were born in a high-risk country, and you have not been immunized against hepatitis B (hepatitis B vaccine).  You have HIV or AIDS.  You use needles to inject street drugs.  You live with someone who has hepatitis B.  You have had sex with someone who has hepatitis B.  You get hemodialysis treatment.  You take certain medicines for conditions, including cancer, organ transplantation, and autoimmune conditions. Hepatitis C  Blood testing is recommended for:  Everyone born from [birth year] through 1967.  Anyone with known risk factors for hepatitis C. Sexually transmitted infections (STIs)  You should be screened for sexually transmitted infections (STIs) including gonorrhea and chlamydia if:  You are sexually active and are younger than 28 years of age.  You are older than 28 years of age and your health care provider tells you that you are at risk for this type of infection.  Your sexual activity has changed since you were last screened and you are at an increased risk for chlamydia or gonorrhea. Ask your health care provider if you are at risk.  If you do not have HIV, but are at risk, it may be recommended that you take a prescription medicine daily to prevent HIV infection. This is called  pre-exposure prophylaxis (PrEP). You are considered at risk if:  You are sexually active and do not regularly use condoms or know the HIV status of your partner(s).  You take drugs by injection.  You are sexually active with a partner who has HIV. Talk with your health care provider about whether you are at high risk of being infected with HIV. If you choose to begin PrEP, you should first be tested for HIV. You should then be tested every  3 months for as long as you are taking PrEP. Pregnancy  If you are premenopausal and you may become pregnant, ask your health care provider about preconception counseling.  If you may become pregnant, take 400 to 800 micrograms (mcg) of folic acid every day.  If you want to prevent pregnancy, talk to your health care provider about birth control (contraception). Osteoporosis and menopause  Osteoporosis is a disease in which the bones lose minerals and strength with aging. This can result in serious bone fractures. Your risk for osteoporosis can be identified using a bone density scan.  If you are 32 years of age or older, or if you are at risk for osteoporosis and fractures, ask your health care provider if you should be screened.  Ask your health care provider whether you should take a calcium or vitamin D supplement to lower your risk for osteoporosis.  Menopause may have certain physical symptoms and risks.  Hormone replacement therapy may reduce some of these symptoms and risks. Talk to your health care provider about whether hormone replacement therapy is right for you. Follow these instructions at home:  Schedule regular health, dental, and eye exams.  Stay current with your immunizations.  Do not use any tobacco products including cigarettes, chewing tobacco, or electronic cigarettes.  If you are pregnant, do not drink alcohol.  If you are breastfeeding, limit how much and how often you drink alcohol.  Limit alcohol intake to no more  than 1 drink per day for nonpregnant women. One drink equals 12 ounces of beer, 5 ounces of wine, or 1 ounces of hard liquor.  Do not use street drugs.  Do not share needles.  Ask your health care provider for help if you need support or information about quitting drugs.  Tell your health care provider if you often feel depressed.  Tell your health care provider if you have ever been abused or do not feel safe at home. This information is not intended to replace advice given to you by your health care provider. Make sure you discuss any questions you have with your health care provider. Document Released: 06/30/2011 Document Revised: 05/22/2016 Document Reviewed: 09/18/2015 Elsevier Interactive Patient Education  2017 [employer].

## 2017-05-21 NOTE — Progress Notes (Signed)
ANNUAL PREVENTATIVE CARE GYN  ENCOUNTER NOTE  Subjective:       Mallory Johnston is a 26 y.o. 281P0010 female here for a routine annual gynecologic exam.  Current complaints: 1.  none   Patient continues to have regular cycles with birth control pills. This most recent cycle however was heavy for 7 days. She did not miss any pills.  She denies any major interval health issues. Bowel function is normal. Bladder function is normal.   Gynecologic History Patient's last menstrual period was 05/09/2017 (exact date). Contraception: OCP (estrogen/progesterone) Last Pap: 5/23016 neg/neg/neg. Results were: normal Last mammogram: n/a. Results were: Marland Kitchen.  Obstetric History OB History  Gravida Para Term Preterm AB Living  1       1    SAB TAB Ectopic Multiple Live Births  1            # Outcome Date GA Lbr Len/2nd Weight Sex Delivery Anes PTL Lv  1 SAB 2014              Past Medical History:  Diagnosis Date  . H/O bilateral breast reduction surgery   . Lymphedema of left leg   . Ovarian cyst   . S/P tonsillectomy     Past Surgical History:  Procedure Laterality Date  . BREAST REDUCTION SURGERY    . OVARIAN CYST REMOVAL    . TONSILLECTOMY      Current Outpatient Prescriptions on File Prior to Visit  Medication Sig Dispense Refill  . meloxicam (MOBIC) 15 MG tablet Take 1 tablet (15 mg total) by mouth daily. For pelvic pain 30 tablet 0  . norethindrone-ethinyl estradiol-iron (JUNEL FE 1.5/30) 1.5-30 MG-MCG tablet Take 1 tablet by mouth daily. 1 Package 11   No current facility-administered medications on file prior to visit.     Allergies  Allergen Reactions  . Amoxicillin Rash  . Percocet [Oxycodone-Acetaminophen] Rash    Social History   Social History  . Marital status: Single    Spouse name: N/A  . Number of children: N/A  . Years of education: N/A   Occupational History  . Not on file.   Social History Main Topics  . Smoking status: Never Smoker  . Smokeless  tobacco: Never Used  . Alcohol use No  . Drug use: No  . Sexual activity: Not Currently   Other Topics Concern  . Not on file   Social History Narrative  . No narrative on file    Family History  Problem Relation Age of Onset  . Hypertension Father   . Diabetes Mother   . Breast cancer Neg Hx   . Ovarian cancer Neg Hx   . Colon cancer Neg Hx     The following portions of the patient's history were reviewed and updated as appropriate: allergies, current medications, past family history, past medical history, past social history, past surgical history and problem list.  Review of Systems Review of Systems  Constitutional: Negative.   HENT: Negative.   Eyes: Negative.   Respiratory: Negative.   Cardiovascular: Negative.   Gastrointestinal: Negative.   Genitourinary: Negative.   Musculoskeletal: Negative.   Skin: Negative.   Neurological: Negative.   Endo/Heme/Allergies: Negative.   Psychiatric/Behavioral: Negative.       Objective:   BP 115/73   Pulse 90   Ht 5\' 3"  (1.6 m)   Wt 189 lb 4.8 oz (85.9 kg)   LMP 05/09/2017 (Exact Date)   BMI 33.53 kg/m  CONSTITUTIONAL: Well-developed, well-nourished female  in no acute distress.  PSYCHIATRIC: Normal mood and affect. Normal behavior. Normal judgment and thought content. NEUROLGIC: Alert and oriented to person, place, and time. Normal muscle tone coordination. No cranial nerve deficit noted. HENT:  Normocephalic, atraumatic, External right and left ear normal. Oropharynx is clear and moist EYES: Conjunctivae and EOM are normal. No scleral icterus.  NECK: Normal range of motion, supple, no masses.  Normal thyroid.  SKIN: Skin is warm and dry. No rash noted. Not diaphoretic. No erythema. No pallor. CARDIOVASCULAR: Normal heart rate noted, regular rhythm, no murmur. RESPIRATORY: Clear to auscultation bilaterally. Effort and breath sounds normal, no problems with respiration noted. BREASTS: Symmetric in size. No masses,  skin changes, nipple drainage, or lymphadenopathy. Post breast reduction scarring is healed ABDOMEN: Soft,, no distention noted.  No tenderness, rebound or guarding.  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 No tenderness.  No cyanosis, clubbing, or edema.  2+ distal pulses. LYMPHATIC: No Axillary, Supraclavicular, or Inguinal Adenopathy.    Assessment:   Annual gynecologic examination 26 y.o. Contraception: OCP (estrogen/progesterone) [id number] Problem List Items Addressed This Visit    None    Visit Diagnoses    Well woman exam with routine gynecological exam    -  Primary      Plan:  Pap: Pap, Reflex if ASCUS Mammogram: Not Indicated Stool Guaiac Testing:  Not Indicated Labs: thur pcp Routine preventative health maintenance measures emphasized: Exercise/Diet/Weight control, Tobacco Warnings, Alcohol/Substance use risks and Safe Sex Junel FE 1.530 is prescribed/refilled for 1 year Return to Clinic - 1 Year   Crystal Hampton, CMA  Herold Harms, MD  Note: This dictation was prepared with [employer] dictation along with smaller phrase technology. Any transcriptional errors that result from this process are unintentional.

## 2017-05-26 LAB — PAP IG W/ RFLX HPV ASCU: PAP SMEAR COMMENT: 0

## 2017-10-27 ENCOUNTER — Encounter: Payer: Self-pay | Admitting: Internal Medicine

## 2017-10-27 ENCOUNTER — Ambulatory Visit (INDEPENDENT_AMBULATORY_CARE_PROVIDER_SITE_OTHER): Payer: BLUE CROSS/BLUE SHIELD | Admitting: Internal Medicine

## 2017-10-27 VITALS — BP 104/68 | HR 84 | Ht 63.0 in | Wt 193.0 lb

## 2017-10-27 DIAGNOSIS — G4730 Sleep apnea, unspecified: Secondary | ICD-10-CM

## 2017-10-27 NOTE — Progress Notes (Signed)
Date:  10/27/2017   Name:  Mallory Johnston   DOB:  Mar 01, 1991   MRN:  [id number]   Chief Complaint: Insomnia (Trouble staying alseep- boyfriend noticed when sleeping wakes self up when snoring. "its almost like im waking self up because I cant breathe or something." ) Insomnia  Primary symptoms: sleep disturbance.  The current episode started more than one month. The onset quality is undetermined. The problem occurs nightly.  She does snore some but her boyfriend has noticed that she wakes herself up with snoring.  He has not mentioned pauses in breathing.  She has unrefreshing sleep, frequent morning headaches and daytime somnolence.  Epworth score is 15.    Review of Systems  Constitutional: Positive for fatigue. Negative for chills and fever.  Respiratory: Negative for chest tightness, shortness of breath and wheezing.   Cardiovascular: Negative for chest pain and palpitations.  Neurological: Positive for headaches. Negative for dizziness, seizures, syncope and speech difficulty.  Psychiatric/Behavioral: Positive for sleep disturbance. The patient has insomnia.     Patient Active Problem List   Diagnosis Date Noted  . Class 1 obesity due to excess calories without serious comorbidity with body mass index (BMI) of 30.0 to 30.9 in adult 02/11/2017  . History of cervical dysplasia 02/11/2017  . Liver lesion, right lobe 10/17/2016  . Von Willebrand's disease (HCC) 11/10/2015  . PCOS (polycystic ovarian syndrome) 09/20/2015    Prior to Admission medications   Medication Sig Start Date End Date Taking? Authorizing Provider  meloxicam (MOBIC) 15 MG tablet Take 1 tablet (15 mg total) by mouth daily. For pelvic pain 10/20/16   Reubin MilanBerglund, Iseah Plouff H, MD  norethindrone-ethinyl estradiol-iron (JUNEL FE 1.5/30) 1.5-30 MG-MCG tablet Take 1 tablet by mouth daily. 05/21/17   Defrancesco, Prentice DockerMartin A, MD    Allergies  Allergen Reactions  . Amoxicillin Rash  . Percocet [Oxycodone-Acetaminophen]  Rash    Past Surgical History:  Procedure Laterality Date  . BREAST REDUCTION SURGERY    . OVARIAN CYST REMOVAL    . TONSILLECTOMY      Social History  Substance Use Topics  . Smoking status: Never Smoker  . Smokeless tobacco: Never Used  . Alcohol use Yes     Comment: rare     Medication list has been reviewed and updated.  PHQ 2/9 Scores 10/27/2017  PHQ - 2 Score 0    Physical Exam  Constitutional: She is oriented to person, place, and time. She appears well-developed. No distress.  HENT:  Head: Normocephalic and atraumatic.  Neck: Normal range of motion. Neck supple. No thyromegaly present.  Cardiovascular: Normal rate, regular rhythm and normal heart sounds.   Pulmonary/Chest: Effort normal and breath sounds normal. No respiratory distress. She has no wheezes.  Musculoskeletal: She exhibits no edema.  Neurological: She is alert and oriented to person, place, and time.  Skin: Skin is warm and dry. No rash noted.  Psychiatric: She has a normal mood and affect. Her behavior is normal. Thought content normal.  Nursing note and vitals reviewed.   BP 104/68   Pulse 84   Ht 5\' 3"  (1.6 m)   Wt 193 lb (87.5 kg)   LMP 10/19/2017 (Exact Date)   SpO2 99%   BMI 34.19 kg/m   Assessment and Plan: 1. 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   Partially dictated using Animal nutritionistDragon software. Any errors are unintentional.  Bari EdwardLaura Velecia Ovitt, MD  [facility]  10/27/2017

## 2018-06-30 ENCOUNTER — Ambulatory Visit (INDEPENDENT_AMBULATORY_CARE_PROVIDER_SITE_OTHER): Payer: Self-pay | Admitting: Internal Medicine

## 2018-06-30 ENCOUNTER — Encounter: Payer: Self-pay | Admitting: Internal Medicine

## 2018-06-30 VITALS — BP 100/76 | HR 88 | Temp 98.3°F | Resp 16 | Ht 61.0 in | Wt 193.0 lb

## 2018-06-30 DIAGNOSIS — L729 Follicular cyst of the skin and subcutaneous tissue, unspecified: Secondary | ICD-10-CM

## 2018-06-30 NOTE — Progress Notes (Signed)
    Date:  06/30/2018   Name:  Mallory Johnston   DOB:  Dec 18, 1991   MRN:  [id number]   Chief Complaint: Cyst (1 month left axillary) Noticed it about 2 months ago in the bath.  There has been no pain and no drainage. It seems to be getting smaller but still persistent.   Review of Systems  Constitutional: Negative for fever.  Skin: Negative for color change, rash and wound.    Patient Active Problem List   Diagnosis Date Noted  . Class 1 obesity due to excess calories without serious comorbidity with body mass index (BMI) of 30.0 to 30.9 in adult 02/11/2017  . History of cervical dysplasia 02/11/2017  . Liver lesion, right lobe 10/17/2016  . Von Willebrand's disease (HCC) 11/10/2015  . PCOS (polycystic ovarian syndrome) 09/20/2015    Prior to Admission medications   Medication Sig Start Date End Date Taking? Authorizing Provider  norethindrone-ethinyl estradiol-iron (JUNEL FE 1.5/30) 1.5-30 MG-MCG tablet Take 1 tablet by mouth daily. 05/21/17  Yes Defrancesco, Prentice DockerMartin A, MD    Allergies  Allergen Reactions  . Amoxicillin Rash  . Percocet [Oxycodone-Acetaminophen] Rash    Past Surgical History:  Procedure Laterality Date  . BREAST REDUCTION SURGERY    . OVARIAN CYST REMOVAL    . TONSILLECTOMY      Social History   Tobacco Use  . Smoking status: Never Smoker  . Smokeless tobacco: Never Used  Substance Use Topics  . Alcohol use: Yes    Comment: rare  . Drug use: No     Medication list has been reviewed and updated.  Current Meds  Medication Sig  . norethindrone-ethinyl estradiol-iron (JUNEL FE 1.5/30) 1.5-30 MG-MCG tablet Take 1 tablet by mouth daily.    PHQ 2/9 Scores 10/27/2017  PHQ - 2 Score 0    Physical Exam  Constitutional: She is oriented to person, place, and time. She appears well-developed. No distress.  HENT:  Head: Normocephalic and atraumatic.  Pulmonary/Chest: Effort normal. No respiratory distress.  Musculoskeletal: Normal range of motion.   Neurological: She is alert and oriented to person, place, and time.  Skin: Skin is warm and dry. No rash noted.  In left axilla - tiny < pea sized mobile, non tender cystic mass Overlying skin normal  Psychiatric: She has a normal mood and affect. Her behavior is normal. Thought content normal.  Nursing note and vitals reviewed.   BP 100/76   Pulse 88   Temp 98.3 F (36.8 C) (Oral)   Resp 16   Ht 5\' 1"  (1.549 m)   Wt 193 lb (87.5 kg)   LMP 06/01/2018   SpO2 98%   BMI 36.47 kg/m   Assessment and Plan: 1. Cyst of skin Resolving without evidence of infection or other concern Pt reassured - return if enlarging, draining, painful   No orders of the defined types were placed in this encounter.   Partially dictated using Animal nutritionistDragon software. Any errors are unintentional.  Bari EdwardLaura Heleena Miceli, MD [facility]  06/30/2018   There are no diagnoses linked to this encounter.

## 2019-01-20 ENCOUNTER — Emergency Department
Admission: EM | Admit: 2019-01-20 | Discharge: 2019-01-20 | Disposition: A | Discharge status: Home / Self Care | Payer: BLUE CROSS/BLUE SHIELD | Attending: Emergency Medicine | Admitting: Emergency Medicine

## 2019-01-20 ENCOUNTER — Emergency Department: Payer: BLUE CROSS/BLUE SHIELD

## 2019-01-20 ENCOUNTER — Other Ambulatory Visit: Payer: Self-pay

## 2019-01-20 DIAGNOSIS — R102 Pelvic and perineal pain: Secondary | ICD-10-CM | POA: Insufficient documentation

## 2019-01-20 DIAGNOSIS — E282 Polycystic ovarian syndrome: Secondary | ICD-10-CM | POA: Insufficient documentation

## 2019-01-20 LAB — URINALYSIS, COMPLETE (UACMP) WITH MICROSCOPIC
BACTERIA UA: NONE SEEN
Bilirubin Urine: NEGATIVE
Glucose, UA: NEGATIVE mg/dL
Ketones, ur: NEGATIVE mg/dL
NITRITE: NEGATIVE
Protein, ur: 30 mg/dL — AB
RBC / HPF: >50 RBC/hpf — ABNORMAL HIGH (ref 0–5)
SPECIFIC GRAVITY, URINE: 1.016 (ref 1.005–1.030)
pH: 7 (ref 5.0–8.0)

## 2019-01-20 LAB — WET PREP, GENITAL
CLUE CELLS WET PREP: NONE SEEN
Sperm: NONE SEEN
TRICH WET PREP: NONE SEEN
Yeast Wet Prep HPF POC: NONE SEEN

## 2019-01-20 LAB — CBC WITH DIFFERENTIAL/PLATELET
ABS IMMATURE GRANULOCYTES: 0.04 10*3/uL (ref 0.00–0.07)
BASOS ABS: 0 10*3/uL (ref 0.0–0.1)
BASOS PCT: 0 %
Eosinophils Absolute: 0.1 10*3/uL (ref 0.0–0.5)
Eosinophils Relative: 1 %
HCT: 40.5 % (ref 36.0–46.0)
HEMOGLOBIN: 13 g/dL (ref 12.0–15.0)
IMMATURE GRANULOCYTES: 0 %
LYMPHS PCT: 12 %
Lymphs Abs: 1.3 10*3/uL (ref 0.7–4.0)
MCH: 29 pg (ref 26.0–34.0)
MCHC: 32.1 g/dL (ref 30.0–36.0)
MCV: 90.4 fL (ref 80.0–100.0)
MONO ABS: 0.6 10*3/uL (ref 0.1–1.0)
Monocytes Relative: 6 %
NEUTROS ABS: 9 10*3/uL — AB (ref 1.7–7.7)
NEUTROS PCT: 81 %
NRBC: 0 % (ref 0.0–0.2)
PLATELETS: 273 10*3/uL (ref 150–400)
RBC: 4.48 MIL/uL (ref 3.87–5.11)
RDW: 13.5 % (ref 11.5–15.5)
WBC: 11 10*3/uL — AB (ref 4.0–10.5)

## 2019-01-20 LAB — BASIC METABOLIC PANEL
ANION GAP: 8 (ref 5–15)
BUN: 6 mg/dL (ref 6–20)
CHLORIDE: 107 mmol/L (ref 98–111)
CO2: 23 mmol/L (ref 22–32)
Calcium: 9 mg/dL (ref 8.9–10.3)
Creatinine, Ser: 0.7 mg/dL (ref 0.44–1.00)
Glucose, Bld: 102 mg/dL — ABNORMAL HIGH (ref 70–99)
POTASSIUM: 4 mmol/L (ref 3.5–5.1)
Sodium: 138 mmol/L (ref 135–145)

## 2019-01-20 LAB — RAPID HIV SCREEN (HIV 1/2 AB+AG)
HIV 1/2 ANTIBODIES: NONREACTIVE
HIV-1 P24 ANTIGEN - HIV24: NONREACTIVE

## 2019-01-20 LAB — CHLAMYDIA/NGC RT PCR (ARMC ONLY)
Chlamydia Tr: NOT DETECTED
N gonorrhoeae: NOT DETECTED

## 2019-01-20 LAB — POCT PREGNANCY, URINE: PREG TEST UR: NEGATIVE

## 2019-01-20 LAB — GLUCOSE, CAPILLARY: GLUCOSE-CAPILLARY: 80 mg/dL (ref 70–99)

## 2019-01-20 MED ORDER — KETOROLAC TROMETHAMINE 30 MG/ML IJ SOLN
30.0000 mg | Freq: Once | INTRAMUSCULAR | Status: AC
  Administered 2019-01-20: 30 mg via INTRAVENOUS
  Filled 2019-01-20: qty 1

## 2019-01-20 MED ORDER — LIDOCAINE HCL (PF) 1 % IJ SOLN
INTRAMUSCULAR | Status: AC
  Administered 2019-01-20: 0.9 mL
  Filled 2019-01-20: qty 5

## 2019-01-20 MED ORDER — DOXYCYCLINE HYCLATE 100 MG PO CAPS: 100.0000 mg | ORAL_CAPSULE | Freq: Two times a day (BID) | ORAL | 0 refills | Status: AC

## 2019-01-20 MED ORDER — CEFTRIAXONE SODIUM 250 MG IJ SOLR
250.0000 mg | Freq: Once | INTRAMUSCULAR | Status: AC
  Administered 2019-01-20: 250 mg via INTRAMUSCULAR
  Filled 2019-01-20: qty 250

## 2019-01-20 MED ORDER — FENTANYL CITRATE (PF) 100 MCG/2ML IJ SOLN
50.0000 ug | Freq: Once | INTRAMUSCULAR | Status: AC
  Administered 2019-01-20: 50 ug via INTRAVENOUS
  Filled 2019-01-20: qty 2

## 2019-01-20 NOTE — Discharge Instructions (Addendum)
Please seek medical attention for any high fevers, chest pain, shortness of breath, change in behavior, persistent vomiting, bloody stool or any other new or concerning symptoms.  

## 2019-01-20 NOTE — ED Provider Notes (Signed)
[facility] Emergency Department Provider Note   ____________________________________________   I have reviewed the triage vital signs and the nursing notes.   HISTORY  Chief Complaint Abdominal Pain   History limited by: Not Limited   HPI Mallory Johnston is a 28 y.o. female who presents to the emergency department today because of concern for abdominal pain. The pain is located in the suprapubic region. The pain started roughly 8 hours ago. It has been constant since then. It is severe. The patient states that when she tried going to work she started feeling clammy and that she might pass out. The patient states that she is currently menstruating and it has been heavier than normal. The patient has a history of PCOS and states this feels somewhat similar but more severe than the pain she has had with that in the past.   Per medical record review patient has a history of ovarian cyst  Past Medical History:  Diagnosis Date  . H/O bilateral breast reduction surgery   . Lymphedema of left leg   . Ovarian cyst   . S/P tonsillectomy     Patient Active Problem List   Diagnosis Date Noted  . Class 1 obesity due to excess calories without serious comorbidity with body mass index (BMI) of 30.0 to 30.9 in adult 02/11/2017  . History of cervical dysplasia 02/11/2017  . Liver lesion, right lobe 10/17/2016  . Von Willebrand's disease (HCC) 11/10/2015  . PCOS (polycystic ovarian syndrome) 09/20/2015    Past Surgical History:  Procedure Laterality Date  . BREAST REDUCTION SURGERY    . OVARIAN CYST REMOVAL    . TONSILLECTOMY      Prior to Admission medications   Medication Sig Start Date End Date Taking? Authorizing Provider  norethindrone-ethinyl estradiol-iron (JUNEL FE 1.5/30) 1.5-30 MG-MCG tablet Take 1 tablet by mouth daily. 05/21/17   Defrancesco, Prentice DockerMartin A, MD    Allergies Amoxicillin and Percocet [oxycodone-acetaminophen]  Family History  Problem  Relation Age of Onset  . Hypertension Father   . Diabetes Mother   . Breast cancer Neg Hx   . Ovarian cancer Neg Hx   . Colon cancer Neg Hx     Social History Social History   Tobacco Use  . Smoking status: Never Smoker  . Smokeless tobacco: Never Used  Substance Use Topics  . Alcohol use: Yes    Comment: rare  . Drug use: No    Review of Systems Constitutional: No fever/chills Eyes: No visual changes. ENT: No sore throat. Cardiovascular: Denies chest pain. Respiratory: Denies shortness of breath. Gastrointestinal: Positive for suprapubic pain. Genitourinary: Negative for dysuria. Musculoskeletal: Negative for back pain. Skin: Negative for rash. Neurological: Negative for headaches, focal weakness or numbness.  ____________________________________________   PHYSICAL EXAM:  VITAL SIGNS: ED Triage Vitals [01/20/19 1115]  Enc Vitals Group     BP (!) 126/95     Pulse Rate 77     Resp 13     Temp (!) 97.5 F (36.4 C)     Temp Source Oral     SpO2 100 %     Weight 189 lb (85.7 kg)     Height 5\' 1"  (1.549 m)     Head Circumference      Peak Flow      Pain Score 10   Constitutional: Alert and oriented.  Eyes: Conjunctivae are normal.  ENT      Head: Normocephalic and atraumatic.      Nose: No  congestion/rhinnorhea.      Mouth/Throat: Mucous membranes are moist.      Neck: No stridor. Hematological/Lymphatic/Immunilogical: No cervical lymphadenopathy. Cardiovascular: Normal rate, regular rhythm.  No murmurs, rubs, or gallops.  Respiratory: Normal respiratory effort without tachypnea nor retractions. Breath sounds are clear and equal bilaterally. No wheezes/rales/rhonchi. Gastrointestinal: Soft and tender to palpation in the suprapubic region Genitourinary: No external lesions. Small amount of blood in vaginal vault. Moderate cervical motion tenderness Musculoskeletal: Normal range of motion in all extremities. No lower extremity edema. Neurologic:  Normal  speech and language. No gross focal neurologic deficits are appreciated.  Skin:  Skin is warm, dry and intact. No rash noted. Psychiatric: Mood and affect are normal. Speech and behavior are normal. Patient exhibits appropriate insight and judgment.  ____________________________________________    LABS (pertinent positives/negatives)  Upreg negative BMP wnl except glu 102 UA clear, large hgb dipstick, 30 protein, trace leukocyte, >50 rbc, 11-20 wbc CBC wbc 11.0, hgb 13.0, plt 273  ____________________________________________   EKG  None  ____________________________________________    RADIOLOGY  US pelvis No acute abnormality  ____________________________________________   PROCEDURES  Procedures  ____________________________________________   INITIAL IMPRESSION / ASSESSMENT AND PLAN / ED COURSE  Pertinent labs & imaging results that were available during my care of the patient were reviewed by me and considered in my medical decision making (see chart for details).   Patient presented to the emergency department today because of concerns of suprapubic pain.  Patient is currently menstruating.  Patient does have history of PID as well as ovarian cyst.  Ultrasound was obtained which did not show any concerning ovarian finding or cyst.  Additionally pelvic exam was performed with some mild CMT.  Given this finding to have concerns for possible PID.  Will give patient shot of antibiotics here and will give prescription for further antibiotics.  ____________________________________________   FINAL CLINICAL IMPRESSION(S) / ED DIAGNOSES  Final diagnoses:  Pelvic pain in female     Note: This dictation was prepared with Dragon dictation. Any transcriptional errors that result from this process are unintentional     Phineas Semen, MD 01/20/19 [phone]

## 2019-01-20 NOTE — ED Notes (Signed)
Patient verbalized understanding of discharge instructions, no questions. Patient ambulated out of ED with steady gait in no distress.  

## 2019-01-20 NOTE — ED Triage Notes (Signed)
Patient arrives with c/o bilateral lower abdominal pain that started at 3am. Patient stated she started her period at this time. Patient reports feeling clammy at work and decided to come to ED. Patient took 650mg  tylenol.

## 2019-01-25 ENCOUNTER — Encounter: Payer: Self-pay | Admitting: Obstetrics and Gynecology

## 2019-01-25 ENCOUNTER — Ambulatory Visit (INDEPENDENT_AMBULATORY_CARE_PROVIDER_SITE_OTHER): Payer: BLUE CROSS/BLUE SHIELD | Admitting: Obstetrics and Gynecology

## 2019-01-25 VITALS — BP 117/78 | HR 82 | Ht 61.0 in | Wt 183.5 lb

## 2019-01-25 DIAGNOSIS — R102 Pelvic and perineal pain: Secondary | ICD-10-CM

## 2019-01-25 DIAGNOSIS — N920 Excessive and frequent menstruation with regular cycle: Secondary | ICD-10-CM | POA: Diagnosis not present

## 2019-01-25 MED ORDER — NORETHIN ACE-ETH ESTRAD-FE 1-20 MG-MCG PO TABS: 1.0000 | ORAL_TABLET | Freq: Every day | ORAL | 3 refills | Status: DC

## 2019-01-25 MED ORDER — NORETHIN ACE-ETH ESTRAD-FE 1.5-30 MG-MCG PO TABS: 1.0000 | ORAL_TABLET | Freq: Every day | ORAL | 3 refills | Status: DC

## 2019-01-25 NOTE — Progress Notes (Signed)
Patient comes in today for ED follow up. She was seen for abdominal pain. They put her on Doxycycline for pelvic inflammatory disease. She had US done at ED. Pain scale at a 4 now.

## 2019-01-25 NOTE — Progress Notes (Signed)
HPI:      [name] is a 28 y.o. G1P0010 who LMP was Patient's last menstrual period was 01/20/2019.  Subjective:   She presents today after being seen in the emergency department for pelvic pain.  Despite a negative gonorrhea and chlamydia she was diagnosed with pelvic inflammatory disease and begun on doxycycline.  Patient states that she is taking her doxycycline and her pain has decreased to a 4.  She can function with the pain at this time. She states that her periods have always been heavy and over the last year she has not taken OCPs to regulate them.  She states that she has monthly menses but her periods each last 10 to 14 days. She says she has a history of PCO but has never had the typical issues of PCO, no irregular cycles, no excessive hair growth, no issues with oily skin.  And in fact the patient had an ultrasound in the emergency department which was entirely normal showing no evidence of polycystic ovaries.  1 of her ovaries has been surgically removed. A significant note the patient has von Willebrand's disease.    Hx: The following portions of the patient's history were reviewed and updated as appropriate:             She  has a past medical history of H/O bilateral breast reduction surgery, Lymphedema of left leg, Ovarian cyst, and S/P tonsillectomy. She does not have any pertinent problems on file. She  has a past surgical history that includes Tonsillectomy; Breast reduction surgery; and Ovarian cyst removal. Her family history includes Diabetes in her mother; Hypertension in her father. She  reports that she has never smoked. She has never used smokeless tobacco. She reports current alcohol use. She reports that she does not use drugs. She has a current medication list which includes the following prescription(s): doxycycline and norethindrone-ethinyl estradiol. She is allergic to amoxicillin and percocet [oxycodone-acetaminophen].       Review of Systems:   Review of Systems  Constitutional: Denied constitutional symptoms, night sweats, recent illness, fatigue, fever, insomnia and weight loss.  Eyes: Denied eye symptoms, eye pain, photophobia, vision change and visual disturbance.  Ears/Nose/Throat/Neck: Denied ear, nose, throat or neck symptoms, hearing loss, nasal discharge, sinus congestion and sore throat.  Cardiovascular: Denied cardiovascular symptoms, arrhythmia, chest pain/pressure, edema, exercise intolerance, orthopnea and palpitations.  Respiratory: Denied pulmonary symptoms, asthma, pleuritic pain, productive sputum, cough, dyspnea and wheezing.  Gastrointestinal: Denied, gastro-esophageal reflux, melena, nausea and vomiting.  Genitourinary: See HPI for additional information.  Musculoskeletal: Denied musculoskeletal symptoms, stiffness, swelling, muscle weakness and myalgia.  Dermatologic: Denied dermatology symptoms, rash and scar.  Neurologic: Denied neurology symptoms, dizziness, headache, neck pain and syncope.  Psychiatric: Denied psychiatric symptoms, anxiety and depression.  Endocrine: Denied endocrine symptoms including hot flashes and night sweats.   Meds:   Current Outpatient Medications on File Prior to Visit  Medication Sig Dispense Refill  . doxycycline (VIBRAMYCIN) 100 MG capsule Take 1 capsule (100 mg total) by mouth 2 (two) times daily for 14 days. 28 capsule 0   No current facility-administered medications on file prior to visit.     Objective:     Vitals:   01/25/19 0819  BP: 117/78  Pulse: 82              Physical examination   Pelvic:   Vulva: Normal appearance.  No lesions.  Vagina: No lesions or abnormalities noted.  Support: Normal pelvic support.  Urethra No masses tenderness or scarring.  Meatus Normal size without lesions or prolapse.  Cervix: Normal appearance.  No lesions.  Anus: Normal exam.  No lesions.  Perineum: Normal exam.  No lesions.        Bimanual   Uterus:  Mildly  enlarged.  Moderately tender.  Adnexae: No masses.  Non-tender to palpation.  Cul-de-sac: Negative for abnormality.     Assessment:    G1P0010 Patient Active Problem List   Diagnosis Date Noted  . Class 1 obesity due to excess calories without serious comorbidity with body mass index (BMI) of 30.0 to 30.9 in adult 02/11/2017  . History of cervical dysplasia 02/11/2017  . Liver lesion, right lobe 10/17/2016  . Von Willebrand's disease (HCC) 11/10/2015  . PCOS (polycystic ovarian syndrome) 09/20/2015     1. Pelvic pain in female   2. Menorrhagia with regular cycle     Based on her history and her normal ultrasound after being off OCPs for 1 year I question the diagnosis of PCO. I believe that her significant bleeding with menses may be secondary to her von Willebrand's disease. Possible adenomyosis?    Plan:            1.  Patient would like to start OCPs to regulate her cycle and make it lighter.  It is also been shown to improve bleeding in patients with von Willebrand's. OCPs The risks /benefits of OCPs have been explained to the patient in detail.  Product literature has been given to her.  I have instructed her in the use of OCPs and have given her literature reinforcing this information.  I have explained to the patient that OCPs are not as effective for birth control during the first month of use, and that another form of contraception should be used during this time.  Both first-day start and Sunday start have been explained.  The risks and benefits of each was discussed.  She has been made aware of  the fact that other medications may affect the efficacy of OCPs.  I have answered all of her questions, and I believe that she has an understanding of the effectiveness and use of OCPs.  2.  Patient to return for annual examination and Pap smear (patient says she has a history of dysplasia) as well as OCP follow-up in 3.5 months.   Orders No orders of the defined types were  placed in this encounter.    Meds ordered this encounter  Medications  . DISCONTD: norethindrone-ethinyl estradiol-iron (JUNEL FE 1.5/30) 1.5-30 MG-MCG tablet    Sig: Take 1 tablet by mouth daily.    Dispense:  3 Package    Refill:  3  . norethindrone-ethinyl estradiol (JUNEL FE 1/20) 1-20 MG-MCG tablet    Sig: Take 1 tablet by mouth daily.    Dispense:  1 Package    Refill:  3      F/U  Return in about 4 months (around 05/26/2019) for Annual Physical.  Elonda Husky, M.D. 01/25/2019 11:25 AM

## 2019-03-05 ENCOUNTER — Emergency Department
Admission: EM | Admit: 2019-03-05 | Discharge: 2019-03-07 | Disposition: A | Discharge status: Other Acute Inpatient Hospital | Payer: BLUE CROSS/BLUE SHIELD | Source: Home / Self Care | Attending: Emergency Medicine | Admitting: Emergency Medicine

## 2019-03-05 ENCOUNTER — Other Ambulatory Visit: Payer: Self-pay

## 2019-03-05 DIAGNOSIS — Z833 Family history of diabetes mellitus: Secondary | ICD-10-CM | POA: Diagnosis not present

## 2019-03-05 DIAGNOSIS — F332 Major depressive disorder, recurrent severe without psychotic features: Principal | ICD-10-CM | POA: Diagnosis present

## 2019-03-05 DIAGNOSIS — F331 Major depressive disorder, recurrent, moderate: Secondary | ICD-10-CM

## 2019-03-05 DIAGNOSIS — F314 Bipolar disorder, current episode depressed, severe, without psychotic features: Secondary | ICD-10-CM | POA: Diagnosis present

## 2019-03-05 DIAGNOSIS — Z79899 Other long term (current) drug therapy: Secondary | ICD-10-CM

## 2019-03-05 DIAGNOSIS — E282 Polycystic ovarian syndrome: Secondary | ICD-10-CM | POA: Diagnosis not present

## 2019-03-05 DIAGNOSIS — R45851 Suicidal ideations: Secondary | ICD-10-CM | POA: Diagnosis not present

## 2019-03-05 DIAGNOSIS — Z91199 Patient's noncompliance with other medical treatment and regimen due to unspecified reason: Secondary | ICD-10-CM

## 2019-03-05 DIAGNOSIS — Z8249 Family history of ischemic heart disease and other diseases of the circulatory system: Secondary | ICD-10-CM | POA: Diagnosis not present

## 2019-03-05 DIAGNOSIS — Z9119 Patient's noncompliance with other medical treatment and regimen: Secondary | ICD-10-CM

## 2019-03-05 DIAGNOSIS — G4700 Insomnia, unspecified: Secondary | ICD-10-CM | POA: Diagnosis not present

## 2019-03-05 DIAGNOSIS — D680 Von Willebrand's disease: Secondary | ICD-10-CM | POA: Diagnosis present

## 2019-03-05 LAB — ETHANOL: Alcohol, Ethyl (B): <10 mg/dL (ref <10)

## 2019-03-05 LAB — URINE DRUG SCREEN, QUALITATIVE (ARMC ONLY)
Amphetamines, Ur Screen: NOT DETECTED
Barbiturates, Ur Screen: NOT DETECTED
Benzodiazepine, Ur Scrn: NOT DETECTED
Cannabinoid 50 Ng, Ur ~~LOC~~: NOT DETECTED
Cocaine Metabolite,Ur ~~LOC~~: NOT DETECTED
MDMA (Ecstasy)Ur Screen: NOT DETECTED
Methadone Scn, Ur: NOT DETECTED
Opiate, Ur Screen: NOT DETECTED
PHENCYCLIDINE (PCP) UR S: NOT DETECTED
Tricyclic, Ur Screen: NOT DETECTED

## 2019-03-05 LAB — CBC
HCT: 39.9 % (ref 36.0–46.0)
Hemoglobin: 12.7 g/dL (ref 12.0–15.0)
MCH: 28.9 pg (ref 26.0–34.0)
MCHC: 31.8 g/dL (ref 30.0–36.0)
MCV: 90.7 fL (ref 80.0–100.0)
PLATELETS: 336 10*3/uL (ref 150–400)
RBC: 4.4 MIL/uL (ref 3.87–5.11)
RDW: 13.2 % (ref 11.5–15.5)
WBC: 10.5 10*3/uL (ref 4.0–10.5)
nRBC: 0 % (ref 0.0–0.2)

## 2019-03-05 LAB — COMPREHENSIVE METABOLIC PANEL
ALT: 15 U/L (ref 0–44)
AST: 21 U/L (ref 15–41)
Albumin: 4.5 g/dL (ref 3.5–5.0)
Alkaline Phosphatase: 65 U/L (ref 38–126)
Anion gap: 8 (ref 5–15)
BUN: 8 mg/dL (ref 6–20)
CALCIUM: 9.2 mg/dL (ref 8.9–10.3)
CO2: 25 mmol/L (ref 22–32)
CREATININE: 0.76 mg/dL (ref 0.44–1.00)
Chloride: 106 mmol/L (ref 98–111)
GFR calc Af Amer: >60 mL/min (ref >60)
GFR calc non Af Amer: >60 mL/min (ref >60)
Glucose, Bld: 96 mg/dL (ref 70–99)
Potassium: 3.4 mmol/L — ABNORMAL LOW (ref 3.5–5.1)
Sodium: 139 mmol/L (ref 135–145)
Total Bilirubin: 0.5 mg/dL (ref 0.3–1.2)
Total Protein: 9 g/dL — ABNORMAL HIGH (ref 6.5–8.1)

## 2019-03-05 LAB — SALICYLATE LEVEL

## 2019-03-05 LAB — ACETAMINOPHEN LEVEL: Acetaminophen (Tylenol), Serum: <10 ug/mL — ABNORMAL LOW (ref 10–30)

## 2019-03-05 LAB — POCT PREGNANCY, URINE: Preg Test, Ur: NEGATIVE

## 2019-03-05 NOTE — ED Notes (Signed)
Pt. States she has been depressed for the past week.  Pt. States SI thoughts w/ plan to overdose on pills.  Pt. States she does not take daily medications and does not see a therapist.  Pt. Does contract for safety.  Pt. Is calm and cooperative at this time.

## 2019-03-05 NOTE — ED Notes (Signed)
Patient's clothing and belongings placed in labeled belongings back and placed in secured area in ED.  Brown colored headband, silver colored earrings, black colored shirt, 1 pair blue jeans, 1 pair black colored boots, pink colored jacket, pink colored purse (patient reports has $9 in cash, 2 bank card and placed cell phone in purse), 1 pair black colored socks, 1 beige colored bra

## 2019-03-05 NOTE — ED Notes (Signed)
POCT results were NEGATIVE  

## 2019-03-05 NOTE — ED Triage Notes (Signed)
Patient reports feeling depressed.  Patient does reports suicidal thoughts with vague plan of overdose with over the counter medications.  Patient does contract for safety with this RN.

## 2019-03-05 NOTE — ED Triage Notes (Signed)
FIRST NURSE NOTE-here for depression. Ambulatory. Not tearful. Calm.

## 2019-03-06 ENCOUNTER — Encounter: Payer: Self-pay | Admitting: Psychiatry

## 2019-03-06 DIAGNOSIS — R45851 Suicidal ideations: Secondary | ICD-10-CM

## 2019-03-06 DIAGNOSIS — Z9119 Patient's noncompliance with other medical treatment and regimen: Secondary | ICD-10-CM

## 2019-03-06 DIAGNOSIS — F314 Bipolar disorder, current episode depressed, severe, without psychotic features: Secondary | ICD-10-CM | POA: Diagnosis present

## 2019-03-06 DIAGNOSIS — Z91199 Patient's noncompliance with other medical treatment and regimen due to unspecified reason: Secondary | ICD-10-CM

## 2019-03-06 HISTORY — DX: Suicidal ideations: R45.851

## 2019-03-06 NOTE — ED Notes (Signed)
Breakfast tray given.  Hand hygiene encouraged.   

## 2019-03-06 NOTE — ED Notes (Signed)
Pt was given a meal tray and a beverage. Pt currently lying in bed asleep. Nothing needed from staff at this time

## 2019-03-06 NOTE — ED Provider Notes (Signed)
Patient is to be admitted to [facility] by Dr. Jennet Maduro.  Attending Physician will be Dr. Toni Amend.   Patient has been assigned to room 303AB, by [facility] Charge Nurse Terri.   Intake Paper Work has been signed and placed on patient chart.  ER staff is aware of the admission:  ER Secretary    Dr. ER MD   Jillyn Hidden  Patient's Nurse    Patient Access.

## 2019-03-06 NOTE — ED Notes (Signed)
Report to include Situation, Background, Assessment, and Recommendations received from Rhea RN. Patient alert and oriented, warm and dry, in no acute distress. Patient denies SI, HI, AVH and pain. Patient made aware of Q15 minute rounds and Rover and Officer presence for their safety. Patient instructed to come to me with needs or concerns.   

## 2019-03-06 NOTE — ED Provider Notes (Signed)
[facility] Emergency Department Provider Note   ____________________________________________   First MD Initiated Contact with Patient 03/05/19 2358     (approximate)  I have reviewed the triage vital signs and the nursing notes.   HISTORY  Chief Complaint Depression    HPI Mallory Johnston is a 28 y.o. female who presents to the ED voluntarily with a chief complaint of depression with suicidal thoughts.  States she has been depressed for the last week.  Thoughts of suicide by overdosing on pills.  Did not actually take any pills.  Voices no medical complaints.       Past Medical History:  Diagnosis Date  . H/O bilateral breast reduction surgery   . Lymphedema of left leg   . Ovarian cyst   . S/P tonsillectomy     Patient Active Problem List   Diagnosis Date Noted  . Class 1 obesity due to excess calories without serious comorbidity with body mass index (BMI) of 30.0 to 30.9 in adult 02/11/2017  . History of cervical dysplasia 02/11/2017  . Liver lesion, right lobe 10/17/2016  . Von Willebrand's disease (HCC) 11/10/2015  . PCOS (polycystic ovarian syndrome) 09/20/2015    Past Surgical History:  Procedure Laterality Date  . BREAST REDUCTION SURGERY    . OVARIAN CYST REMOVAL    . TONSILLECTOMY      Prior to Admission medications   Medication Sig Start Date End Date Taking? Authorizing Provider  norethindrone-ethinyl estradiol (JUNEL FE 1/20) 1-20 MG-MCG tablet Take 1 tablet by mouth daily. 01/25/19   Linzie Collin, MD    Allergies Amoxicillin and Percocet [oxycodone-acetaminophen]  Family History  Problem Relation Age of Onset  . Hypertension Father   . Diabetes Mother   . Breast cancer Neg Hx   . Ovarian cancer Neg Hx   . Colon cancer Neg Hx     Social History Social History   Tobacco Use  . Smoking status: Never Smoker  . Smokeless tobacco: Never Used  Substance Use Topics  . Alcohol use: Yes    Comment: rare  .  Drug use: No    Review of Systems  Constitutional: No fever/chills Eyes: No visual changes. ENT: No sore throat. Cardiovascular: Denies chest pain. Respiratory: Denies shortness of breath. Gastrointestinal: No abdominal pain.  No nausea, no vomiting.  No diarrhea.  No constipation. Genitourinary: Negative for dysuria. Musculoskeletal: Negative for back pain. Skin: Negative for rash. Neurological: Negative for headaches, focal weakness or numbness. Psychiatric:  Positive for depression with SI.  ____________________________________________   PHYSICAL EXAM:  VITAL SIGNS: ED Triage Vitals [03/05/19 1926]  Enc Vitals Group     BP 139/89     Pulse Rate 93     Resp 18     Temp 98.3 F (36.8 C)     Temp Source Oral     SpO2 97 %     Weight 185 lb (83.9 kg)     Height 5\' 1"  (1.549 m)     Head Circumference      Peak Flow      Pain Score 0     Pain Loc      Pain Edu?      Excl. in GC?     Constitutional: Alert and oriented. Well appearing and in no acute distress. Eyes: Conjunctivae are normal. PERRL. EOMI. Head: Atraumatic. Nose: No congestion/rhinnorhea. Mouth/Throat: Mucous membranes are moist.  Oropharynx non-erythematous. Neck: No stridor.   Cardiovascular: Normal rate, regular rhythm. Grossly normal  heart sounds.  Good peripheral circulation. Respiratory: Normal respiratory effort.  No retractions. Lungs CTAB. Gastrointestinal: Soft and nontender. No distention. No abdominal bruits. No CVA tenderness. Musculoskeletal: No lower extremity tenderness nor edema.  No joint effusions. Neurologic:  Normal speech and language. No gross focal neurologic deficits are appreciated. No gait instability. Skin:  Skin is warm, dry and intact. No rash noted. Psychiatric: Mood and affect are flat. Speech and behavior are normal.  ____________________________________________   LABS (all labs ordered are listed, but only abnormal results are displayed)  Labs Reviewed    COMPREHENSIVE METABOLIC PANEL - Abnormal; Notable for the following components:      Result Value   Potassium 3.4 (*)    Total Protein 9.0 (*)    All other components within normal limits  ACETAMINOPHEN LEVEL - Abnormal; Notable for the following components:   Acetaminophen (Tylenol), Serum <10 (*)    All other components within normal limits  ETHANOL  SALICYLATE LEVEL  CBC  URINE DRUG SCREEN, QUALITATIVE ([facility] ONLY)  POC URINE PREG, ED  POCT PREGNANCY, URINE   ____________________________________________  EKG  None ____________________________________________  RADIOLOGY  ED MD interpretation: None  Official radiology report(s): No results found.  ____________________________________________   PROCEDURES  Procedure(s) performed (including Critical Care):  Procedures   ____________________________________________   INITIAL IMPRESSION / ASSESSMENT AND PLAN / ED COURSE  As part of my medical decision making, I reviewed the following data within the electronic MEDICAL RECORD NUMBER Nursing notes reviewed and incorporated, Labs reviewed, Old chart reviewed, A consult was requested and obtained from this/these consultant(s) Psychiatry and Notes from prior ED visits     28 year old female who presents with depression with thoughts of overdosing on pills.  Contracts for safety in the emergency department.  She is medically cleared pending psychiatric evaluation and disposition.   Clinical Course as of Mar 05 641  Wynelle Link Mar 06, 2019  0602 Patient was evaluated by [facility] psychiatrist Dr. Jonelle Sidle who recommends inpatient hospitalization.  Patient will remain voluntary.   [JS]    Clinical Course User Index [JS] Irean Hong, MD     ____________________________________________   FINAL CLINICAL IMPRESSION(S) / ED DIAGNOSES  Final diagnoses:  Moderate episode of recurrent major depressive disorder [facility])     ED Discharge Orders    None       Note:  This document  was prepared using Dragon voice recognition software and may include unintentional dictation errors.   Irean Hong, MD 03/06/19 [phone]

## 2019-03-06 NOTE — ED Notes (Signed)
Hourly rounding reveals patient in room. No complaints, stable, in no acute distress. Q15 minute rounds and monitoring via Rover and Officer to continue.   

## 2019-03-06 NOTE — ED Notes (Signed)
Patient is alert, oriented and ambulatory. Patient denies pain. Patient denies SI/HI and A/V hallucinations. Patient states she came to hospital because she has been depressed. Patient states "I can't handle it anymore."  Patient is calm, tearful and cooperative. Patient provided support and encouragement. Q 15 minute checks in progress and patient remains safe on unit. Monitoring continues.

## 2019-03-06 NOTE — Consult Note (Signed)
Chart reviewed and discussed witTTS. Meets criteria for admission. Orders entered.

## 2019-03-06 NOTE — ED Notes (Signed)
Hourly rounding reveals patient sleeping in room. No complaints, stable, in no acute distress. Q15 minute rounds and monitoring via Rover and Officer to continue.  

## 2019-03-06 NOTE — ED Notes (Signed)
Pt lying in bed watching TV. Pt in no acute distress at this time. Pt was given a beverage per request. Pt refused snack at this time/ Nothing needed from staff at this time

## 2019-03-06 NOTE — BH Assessment (Signed)
Assessment Note  Mallory Johnston is an 28 y.o. female. Mallory Johnston arrived to the ED arrived to the ED by her personal transportation.  She reports that she has depression. She reports, "I am having a lot of family problems. Stress at work, and being overwhelmed".  She states, "My mom is basically done with me". She states that her mother has given up on her.  She reports feelings of sadness. She states, "Maybe it would be better if I wasn't here". She shared that she has a decrease in her sleep, she is eating less, she has a loss of interest in things she previously enjoyed. She is isolating herself.  She reports that she has "a lot" of anxiety and she is feeling as though she is not good enough for people.  She denied having auditory or visual hallucinations.  She reports suicidal ideation with a plan to overdose on pills.  She denied homicidal ideation or intent.  She denied the use of alcohol or drugs.  She reports feelings stressed by her fear of rejection.     TTS spoke with Mallory Johnston [phone]) He states that Mallory Johnston has been talking about hurting herself and worrying about her bills.  He states that he believes that she is tired and shared that she is working 3 jobs. He states that she had a history from age 26 of depressive symptoms and that she has received therapy over the years.  He states that she has improved in the past with treatment and medication.  He reports that she was taking medication.  She has been off her medications for an unknown amount a time.  He reports a prior diagnosis of bipolar disorder.  Diagnosis: Depression  Past Medical History:  Past Medical History:  Diagnosis Date  . H/O bilateral breast reduction surgery   . Lymphedema of left leg   . Ovarian cyst   . S/P tonsillectomy     Past Surgical History:  Procedure Laterality Date  . BREAST REDUCTION SURGERY    . OVARIAN CYST REMOVAL    . TONSILLECTOMY      Family History:  Family History   Problem Relation Age of Onset  . Hypertension Father   . Diabetes Mother   . Breast cancer Neg Hx   . Ovarian cancer Neg Hx   . Colon cancer Neg Hx     Social History:  reports that she has never smoked. She has never used smokeless tobacco. She reports current alcohol use. She reports that she does not use drugs.  Additional Social History:  Alcohol / Drug Use History of alcohol / drug use?: No history of alcohol / drug abuse  CIWA: CIWA-Ar BP: 139/89 Pulse Rate: 93 COWS:    Allergies:  Allergies  Allergen Reactions  . Amoxicillin Rash  . Percocet [Oxycodone-Acetaminophen] Nausea And Vomiting and Rash    Home Medications: (Not in a hospital admission)   OB/GYN Status:  Patient's last menstrual period was 02/18/2019 (approximate).  General Assessment Data Location of Assessment: [facility] ED TTS Assessment: In system Is this a Tele or Face-to-Face Assessment?: Face-to-Face Is this an Initial Assessment or a Re-assessment for this encounter?: Initial Assessment Patient Accompanied by:: N/A Language Other than English: No Living Arrangements: Other (Comment)(Private residence) What gender do you identify as?: Female Marital status: Single Pregnancy Status: No Living Arrangements: Alone Can pt return to current living arrangement?: Yes Admission Status: Voluntary Is patient capable of signing voluntary admission?: Yes Referral Source: Self/Family/Friend Insurance  type: BCBS  Medical Screening Exam [facility] Walk-in ONLY) Medical Exam completed: No Reason for MSE not completed: Other:  Crisis Care Plan Living Arrangements: Alone Legal Guardian: Other:(Self) Name of Psychiatrist: None Name of Therapist: None  Education Status Is patient currently in school?: No Is the patient employed, unemployed or receiving disability?: Employed  Risk to self with the past 6 months Suicidal Ideation: Yes-Currently Present Has patient been a risk to self within the past 6 months  prior to admission? : Yes Suicidal Intent: Yes-Currently Present Has patient had any suicidal intent within the past 6 months prior to admission? : Yes Is patient at risk for suicide?: Yes Suicidal Plan?: Yes-Currently Present Has patient had any suicidal plan within the past 6 months prior to admission? : Yes Specify Current Suicidal Plan: Overdose on pills Access to Means: Yes Specify Access to Suicidal Means: access to medications What has been your use of drugs/alcohol within the last 12 months?: denied use of alcohol or drugs Previous Attempts/Gestures: Yes How many times?: 1 Other Self Harm Risks: denied Triggers for Past Attempts: Unknown Intentional Self Injurious Behavior: None Family Suicide History: No Recent stressful life event(s): Other (Comment)(Work, family concerns) Persecutory voices/beliefs?: No Depression: Yes Depression Symptoms: Tearfulness, Despondent, Feeling worthless/self pity Substance abuse history and/or treatment for substance abuse?: No Suicide prevention information given to non-admitted patients: Not applicable  Risk to Others within the past 6 months Homicidal Ideation: No Does patient have any lifetime risk of violence toward others beyond the six months prior to admission? : No Thoughts of Harm to Others: No Current Homicidal Intent: No Current Homicidal Plan: No Access to Homicidal Means: No Identified Victim: None identified History of harm to others?: No Assessment of Violence: On admission Violent Behavior Description: denied Does patient have access to weapons?: No Criminal Charges Pending?: No Does patient have a court date: No Is patient on probation?: No  Psychosis Hallucinations: None noted Delusions: None noted  Mental Status Report Appearance/Hygiene: In scrubs Eye Contact: Poor Motor Activity: Unremarkable Speech: Logical/coherent, Soft Level of Consciousness: Quiet/awake Mood: Depressed Affect: Flat Anxiety Level:  Minimal Thought Processes: Coherent Judgement: Unimpaired Orientation: Appropriate for developmental age Obsessive Compulsive Thoughts/Behaviors: None  Cognitive Functioning Concentration: Poor Memory: Recent Intact Is patient IDD: No Insight: Fair Impulse Control: Fair Appetite: Poor Have you had any weight changes? : No Change Sleep: Decreased Vegetative Symptoms: Staying in bed  ADLScreening [facility] Assessment Services) Patient's cognitive ability adequate to safely complete daily activities?: Yes Patient able to express need for assistance with ADLs?: Yes Independently performs ADLs?: Yes (appropriate for developmental age)  Prior Inpatient Therapy Prior Inpatient Therapy: No  Prior Outpatient Therapy Prior Outpatient Therapy: Yes Prior Therapy Dates: 2003 Prior Therapy Facilty/Provider(s): [place] Reason for Treatment: Depression, ADHD, SI Does patient have an ACCT team?: No Does patient have Intensive In-House Services?  : No Does patient have [facility] services? : No Does patient have P4CC services?: No  ADL Screening (condition at time of admission) Patient's cognitive ability adequate to safely complete daily activities?: Yes Is the patient deaf or have difficulty hearing?: No Does the patient have difficulty seeing, even when wearing glasses/contacts?: No Does the patient have difficulty concentrating, remembering, or making decisions?: No Patient able to express need for assistance with ADLs?: Yes Does the patient have difficulty dressing or bathing?: No Independently performs ADLs?: Yes (appropriate for developmental age) Does the patient have difficulty walking or climbing stairs?: No Weakness of Legs: None Weakness of Arms/Hands: None  Home  Assistive Devices/Equipment Home Assistive Devices/Equipment: None    Abuse/Neglect Assessment (Assessment to be complete while patient is alone) Abuse/Neglect Assessment Can Be Completed: (Denied by patient)      Advance Directives (For Healthcare) Does Patient Have a Medical Advance Directive?: No          Disposition:  Disposition Initial Assessment Completed for this Encounter: Yes  On Site Evaluation by:   Reviewed with Physician:    Justice Deeds 03/06/2019 12:05 AM

## 2019-03-07 ENCOUNTER — Inpatient Hospital Stay
Admission: AD | Admit: 2019-03-07 | Discharge: 2019-03-09 | DRG: 885 | Disposition: A | Discharge status: Home / Self Care | Payer: BLUE CROSS/BLUE SHIELD | Source: Intra-hospital | Attending: Psychiatry | Admitting: Psychiatry

## 2019-03-07 ENCOUNTER — Other Ambulatory Visit: Payer: Self-pay

## 2019-03-07 DIAGNOSIS — Z79899 Other long term (current) drug therapy: Secondary | ICD-10-CM | POA: Diagnosis not present

## 2019-03-07 DIAGNOSIS — Z833 Family history of diabetes mellitus: Secondary | ICD-10-CM

## 2019-03-07 DIAGNOSIS — F332 Major depressive disorder, recurrent severe without psychotic features: Principal | ICD-10-CM

## 2019-03-07 DIAGNOSIS — Z8249 Family history of ischemic heart disease and other diseases of the circulatory system: Secondary | ICD-10-CM | POA: Diagnosis not present

## 2019-03-07 DIAGNOSIS — D6800 Von Willebrand disease, unspecified: Secondary | ICD-10-CM | POA: Diagnosis present

## 2019-03-07 DIAGNOSIS — G4700 Insomnia, unspecified: Secondary | ICD-10-CM | POA: Diagnosis present

## 2019-03-07 DIAGNOSIS — R45851 Suicidal ideations: Secondary | ICD-10-CM | POA: Diagnosis present

## 2019-03-07 DIAGNOSIS — E282 Polycystic ovarian syndrome: Secondary | ICD-10-CM | POA: Diagnosis present

## 2019-03-07 DIAGNOSIS — F314 Bipolar disorder, current episode depressed, severe, without psychotic features: Secondary | ICD-10-CM | POA: Diagnosis present

## 2019-03-07 MED ORDER — NORETHIN ACE-ETH ESTRAD-FE 1-20 MG-MCG PO TABS: 1.0000 | ORAL_TABLET | Freq: Every day | ORAL | Status: DC

## 2019-03-07 MED ORDER — ALUM & MAG HYDROXIDE-SIMETH 200-200-20 MG/5ML PO SUSP: 30.0000 mL | ORAL | Status: DC | PRN

## 2019-03-07 MED ORDER — HYDROXYZINE HCL 50 MG PO TABS: 50.0000 mg | ORAL_TABLET | Freq: Three times a day (TID) | ORAL | Status: DC | PRN

## 2019-03-07 MED ORDER — MAGNESIUM HYDROXIDE 400 MG/5ML PO SUSP: 30.0000 mL | Freq: Every day | ORAL | Status: DC | PRN

## 2019-03-07 MED ORDER — MIRTAZAPINE 15 MG PO TABS
15.0000 mg | ORAL_TABLET | Freq: Every day | ORAL | Status: DC
  Administered 2019-03-07: 15 mg via ORAL
  Filled 2019-03-07: qty 1

## 2019-03-07 MED ORDER — TRAZODONE HCL 100 MG PO TABS: 100.0000 mg | ORAL_TABLET | Freq: Every evening | ORAL | Status: DC | PRN

## 2019-03-07 NOTE — Progress Notes (Addendum)
Recreation Therapy Notes  Date: 03/07/2019  Time: 9:30 am  Location: Craft Room  Behavioral response: Appropriate  Intervention Topic: Strengths  Discussion/Intervention:  Group content today was focused on strengths. The group identified some of the strengths they have. Individuals stated reason why they do not use their strengths. Patients expressed what strengths others see in them. The group identified important reason to use their strengths. The group participated in the intervention "Picking strengths", where they had a chance to identify some of their strengths. Clinical Observations/Feedback:  Patient came to group and identified kindness and creativity as her strengths. She explained that some of the strengths she could work on is her communication and self-love. Individual was social with peers and staff while participating in group.  Mallory Johnston LRT/CTRS         Jerianne Anselmo 03/07/2019 11:16 AM

## 2019-03-07 NOTE — BHH Suicide Risk Assessment (Signed)
[facility] Admission Suicide Risk Assessment   Nursing information obtained from:  Patient Demographic factors:  Living alone, Low socioeconomic status Current Mental Status:  Suicidal ideation indicated by patient Loss Factors:  Loss of significant relationship Historical Factors:  Prior suicide attempts Risk Reduction Factors:  Employed  Total Time spent with patient: 1 hour Principal Problem: <principal problem not specified> Diagnosis:  Active Problems:   Bipolar I disorder, most recent episode depressed, severe without psychotic features (HCC)  Subjective Data: Patient reports depression which has been gradually getting worse for months.  Recent suicidal thoughts with specific thought of overdosing.  Patient denies any actual wish to die right now.  Not psychotic.  Open to appropriate treatment and cooperative.  Continued Clinical Symptoms:  Alcohol Use Disorder Identification Test Final Score (AUDIT): 0 The "Alcohol Use Disorders Identification Test", Guidelines for Use in Primary Care, Second Edition.  World Science writer [facility]). Score between 0-7:  no or low risk or alcohol related problems. Score between 8-15:  moderate risk of alcohol related problems. Score between 16-19:  high risk of alcohol related problems. Score 20 or above:  warrants further diagnostic evaluation for alcohol dependence and treatment.   CLINICAL FACTORS:   Depression:   Anhedonia   Musculoskeletal: Strength & Muscle Tone: within normal limits Gait & Station: normal Patient leans: Right  Psychiatric Specialty Exam: Physical Exam  Nursing note and vitals reviewed. Constitutional: She appears well-developed and well-nourished.  HENT:  Head: Normocephalic and atraumatic.  Eyes: Pupils are equal, round, and reactive to light. Conjunctivae are normal.  Neck: Normal range of motion.  Cardiovascular: Regular rhythm and normal heart sounds.  Respiratory: Effort normal. No respiratory distress.  GI:  Soft.  Musculoskeletal: Normal range of motion.  Neurological: She is alert.  Skin: Skin is warm and dry.  Psychiatric: Judgment normal. Her affect is blunt. Her speech is delayed. She is slowed and withdrawn. Cognition and memory are normal. She exhibits a depressed mood. She expresses suicidal ideation.    Review of Systems  Constitutional: Negative.   HENT: Negative.   Eyes: Negative.   Respiratory: Negative.   Cardiovascular: Negative.   Gastrointestinal: Negative.   Musculoskeletal: Negative.   Skin: Negative.   Neurological: Negative.   Psychiatric/Behavioral: Positive for depression and suicidal ideas. Negative for hallucinations and substance abuse. The patient is nervous/anxious and has insomnia.     Blood pressure 118/83, pulse 91, temperature 98.6 F (37 C), temperature source Oral, resp. rate 18, height 5\' 1"  (1.549 m), weight 83.9 kg, last menstrual period 02/18/2019, SpO2 100 %.Body mass index is 34.96 kg/m.  General Appearance: Casual  Eye Contact:  Good  Speech:  Clear and Coherent  Volume:  Normal  Mood:  Euthymic  Affect:  Congruent  Thought Process:  Goal Directed  Orientation:  Full (Time, Place, and Person)  Thought Content:  Logical  Suicidal Thoughts:  No  Homicidal Thoughts:  No  Memory:  Immediate;   Fair Recent;   Fair Remote;   Fair  Judgement:  Fair  Insight:  Fair  Psychomotor Activity:  Decreased  Concentration:  Concentration: Fair  Recall:  [employer] of Knowledge:  Fair  Language:  Fair  Akathisia:  No  Handed:  Right  AIMS (if indicated):     Assets:  Desire for Improvement Housing Physical Health Resilience  ADL's:  Intact  Cognition:  WNL  Sleep:  Number of Hours: 3.15      COGNITIVE FEATURES THAT CONTRIBUTE TO RISK:  Polarized  thinking    SUICIDE RISK:   Mild:  Suicidal ideation of limited frequency, intensity, duration, and specificity.  There are no identifiable plans, no associated intent, mild dysphoria and related  symptoms, good self-control (both objective and subjective assessment), few other risk factors, and identifiable protective factors, including available and accessible social support.  PLAN OF CARE: Patient has suicidal thoughts with some planning but is not currently obsessing on it.  Mood is still depressed but she is open to treatment.  Patient will be involved in appropriate groups and activities on the unit.  Medication management will be pursued.  Full evaluation with evaluation of suicidality again prior to discharge.  I certify that inpatient services furnished can reasonably be expected to improve the patient's condition.   Mordecai Rasmussen, MD 03/07/2019, 4:17 PM

## 2019-03-07 NOTE — Plan of Care (Signed)
Patient is newly admitted to the unit. No goals met at this time. Patient observed in bed majority of the morning and early afternoon but she did get up for breakfast/ lunch. Continues to endorse depression but denies current SI/HI/AVH and pain. Goal for today is to try to think more positively and be more active. Encouragement and support provided.  Milieu remains safe with q 15 minute safety checks. Will continue to monitor. 

## 2019-03-07 NOTE — Progress Notes (Signed)
Mallory Johnston is a 28 y.o. female Voluntary admitted for depression and suicide ideation. Pt alert and oriented x 4, calm and cooperative with the admission process. Pt stated she has been depressed and feel like her family will be better without her. Pt has been having SI with intent to OD on medications. Pt stated she is stressed out with family issues, work and financial difficulties.  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 Pt stated she her goal is to get help with abandonment from her mom and rejection from guys. Consents signed, skin/belongings search completed and pt oriented to unit. Pt stable at this time. Pt given the opportunity to express concerns and ask questions. Pt went to bed and safe at this time, will continue to monitor.

## 2019-03-07 NOTE — BHH Group Notes (Signed)
LCSW Group Therapy Note   03/07/2019 1:00 PM  Type of Therapy and Topic:  Group Therapy:  Overcoming Obstacles   Participation Level:  Did Not Attend   Description of Group:    In this group patients will be encouraged to explore what they see as obstacles to their own wellness and recovery. They will be guided to discuss their thoughts, feelings, and behaviors related to these obstacles. The group will process together ways to cope with barriers, with attention given to specific choices patients can make. Each patient will be challenged to identify changes they are motivated to make in order to overcome their obstacles. This group will be process-oriented, with patients participating in exploration of their own experiences as well as giving and receiving support and challenge from other group members.   Therapeutic Goals: 1. Patient will identify personal and current obstacles as they relate to admission. 2. Patient will identify barriers that currently interfere with their wellness or overcoming obstacles.  3. Patient will identify feelings, thought process and behaviors related to these barriers. 4. Patient will identify two changes they are willing to make to overcome these obstacles:      Summary of Patient Progress   X    Therapeutic Modalities:   Cognitive Behavioral Therapy Solution Focused Therapy Motivational Interviewing Relapse Prevention Therapy  Penni Homans, MSW, LCSW 03/07/2019 2:23 PM
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
## 2019-03-07 NOTE — Tx Team (Signed)
Initial Treatment Plan 03/07/2019 1:59 AM Mallory Johnston FXT:[id number]    PATIENT STRESSORS: Financial difficulties Marital or family conflict   PATIENT STRENGTHS: Average or above average intelligence Capable of independent living Physical Health Supportive family/friends   PATIENT IDENTIFIED PROBLEMS: Depression  Anxiety  "Abondomnent by my mom"  "Rejection by guys"               DISCHARGE CRITERIA:  Ability to meet basic life and health needs Improved stabilization in mood, thinking, and/or behavior Medical problems require only outpatient monitoring Motivation to continue treatment in a less acute level of care Verbal commitment to aftercare and medication compliance  PRELIMINARY DISCHARGE PLAN: Attend aftercare/continuing care group Attend PHP/IOP Return to previous living arrangement Return to previous work or school arrangements  PATIENT/FAMILY INVOLVEMENT: This treatment plan has been presented to and reviewed with the patient, Mallory Johnston, and/or family member.  The patient and family have been given the opportunity to ask questions and make suggestions.  Bethann Punches, RN 03/07/2019, 1:59 AM

## 2019-03-07 NOTE — Tx Team (Signed)
Interdisciplinary Treatment and Diagnostic Plan Update  03/07/2019 Time of Session: 230pm Mallory Johnston MRN: [id number]  Principal Diagnosis: <principal problem not specified>  Secondary Diagnoses: Active Problems:   Bipolar I disorder, most recent episode depressed, severe without psychotic features (HCC)   Current Medications:  Current Facility-Administered Medications  Medication Dose Route Frequency Provider Last Rate Last Dose  . alum & mag hydroxide-simeth (MAALOX/MYLANTA) 200-200-20 MG/5ML suspension 30 mL  30 mL Oral Q4H PRN Pucilowska, Jolanta B, MD      . hydrOXYzine (ATARAX/VISTARIL) tablet 50 mg  50 mg Oral TID PRN Pucilowska, Jolanta B, MD      . magnesium hydroxide (MILK OF MAGNESIA) suspension 30 mL  30 mL Oral Daily PRN Pucilowska, Jolanta B, MD      . norethindrone-ethinyl estradiol (JUNEL FE,GILDESS FE,LOESTRIN FE) 1-20 MG-MCG per tablet 1 tablet  1 tablet Oral Daily Pucilowska, Jolanta B, MD      . traZODone (DESYREL) tablet 100 mg  100 mg Oral QHS PRN Pucilowska, Jolanta B, MD       PTA Medications: Medications Prior to Admission  Medication Sig Dispense Refill Last Dose  . norethindrone-ethinyl estradiol (JUNEL FE 1/20) 1-20 MG-MCG tablet Take 1 tablet by mouth daily. 1 Package 3     Patient Stressors: Financial difficulties Marital or family conflict  Patient Strengths: Average or above average intelligence Capable of independent living Physical Health Supportive family/friends  Treatment Modalities: Medication Management, Group therapy, Case management,  1 to 1 session with clinician, Psychoeducation, Recreational therapy.   Physician Treatment Plan for Primary Diagnosis: <principal problem not specified> Long Term Goal(s):     Short Term Goals:    Medication Management: Evaluate patient's response, side effects, and tolerance of medication regimen.  Therapeutic Interventions: 1 to 1 sessions, Unit Group sessions and Medication  administration.  Evaluation of Outcomes: Progressing  Physician Treatment Plan for Secondary Diagnosis: Active Problems:   Bipolar I disorder, most recent episode depressed, severe without psychotic features (HCC)  Long Term Goal(s):     Short Term Goals:       Medication Management: Evaluate patient's response, side effects, and tolerance of medication regimen.  Therapeutic Interventions: 1 to 1 sessions, Unit Group sessions and Medication administration.  Evaluation of Outcomes: Progressing   RN Treatment Plan for Primary Diagnosis: <principal problem not specified> Long Term Goal(s): Knowledge of disease and therapeutic regimen to maintain health will improve  Short Term Goals: Ability to participate in decision making will improve, Ability to verbalize feelings will improve, Ability to disclose and discuss suicidal ideas, Ability to identify and develop effective coping behaviors will improve and Compliance with prescribed medications will improve  Medication Management: RN will administer medications as ordered by provider, will assess and evaluate patient's response and provide education to patient for prescribed medication. RN will report any adverse and/or side effects to prescribing provider.  Therapeutic Interventions: 1 on 1 counseling sessions, Psychoeducation, Medication administration, Evaluate responses to treatment, Monitor vital signs and CBGs as ordered, Perform/monitor CIWA, COWS, AIMS and Fall Risk screenings as ordered, Perform wound care treatments as ordered.  Evaluation of Outcomes: Progressing   LCSW Treatment Plan for Primary Diagnosis: <principal problem not specified> Long Term Goal(s): Safe transition to appropriate next level of care at discharge, Engage patient in therapeutic group addressing interpersonal concerns.  Short Term Goals: Engage patient in aftercare planning with referrals and resources  Therapeutic Interventions: Assess for all discharge  needs, 1 to 1 time with Social worker, Explore available resources and  support systems, Assess for adequacy in community support network, Educate family and significant other(s) on suicide prevention, Complete Psychosocial Assessment, Interpersonal group therapy.  Evaluation of Outcomes: Progressing   Progress in Treatment: Attending groups: Yes. Participating in groups: Yes. Taking medication as prescribed: Yes. Toleration medication: Yes. Family/Significant other contact made: Yes, individual(s) contacted:  Theador Hawthorne, father Patient understands diagnosis: Yes. Discussing patient identified problems/goals with staff: Yes. Medical problems stabilized or resolved: Yes. Denies suicidal/homicidal ideation: Yes. Issues/concerns per patient self-inventory: No. Other: NA  New problem(s) identified: No, Describe:  none reported  New Short Term/Long Term Goal(s):  "To learn to be happy with myself and learn how to deal with stress"  Patient Goals:  "To learn to be happy with myself and learn how to deal with stress"  Discharge Plan or Barriers:  Pt will return home and follow up with outpatient treatment  Reason for Continuation of Hospitalization: Medication stabilization  Estimated Length of Stay: 3-5 days, pt scheduled for d/c on 03/09/19  Attendees: Patient: Mallory Johnston 03/07/2019 3:36 PM  Physician: Mordecai Rasmussen 03/07/2019 3:36 PM  Nursing:  03/07/2019 3:36 PM  RN Care Manager: 03/07/2019 3:36 PM  Social Worker: Lowella Dandy 03/07/2019 3:36 PM  Recreational Therapist: Danella Deis Outlaw 03/07/2019 3:36 PM  Other:  03/07/2019 3:36 PM  Other:  03/07/2019 3:36 PM  Other: 03/07/2019 3:36 PM    Scribe for Treatment Team: Suzan Slick, LCSW 03/07/2019 3:36 PM

## 2019-03-07 NOTE — ED Notes (Signed)
Hourly rounding reveals patient sleeping in room. No complaints, stable, in no acute distress. Q15 minute rounds and monitoring via Rover and Officer to continue.  

## 2019-03-07 NOTE — [facility] Suicide Risk Assessment (Signed)
[facility] INPATIENT:  Family/Significant Other Suicide Prevention Education  Suicide Prevention Education:  Education Completed; Theador Hawthorne, father [phone] has been identified by the patient as the family member/significant other with whom the patient will be residing, and identified as the person(s) who will aid the patient in the event of a mental health crisis (suicidal ideations/suicide attempt).  With written consent from the patient, the family member/significant other has been provided the following suicide prevention education, prior to the and/or following the discharge of the patient.  The suicide prevention education provided includes the following:  Suicide risk factors  Suicide prevention and interventions  National Suicide Hotline telephone number  [facility] assessment telephone number  [facility] Emergency Assistance 911  [facility] and/or Residential Mobile Crisis Unit telephone number  Request made of family/significant other to:  Remove weapons (e.g., guns, rifles, knives), all items previously/currently identified as safety concern.    Remove drugs/medications (over-the-counter, prescriptions, illicit drugs), all items previously/currently identified as a safety concern.  The family member/significant other verbalizes understanding of the suicide prevention education information provided.  The family member/significant other agrees to remove the items of safety concern listed above.  Pts father reported that pt is over-tired and needs her medication for being bipolar. Per father, Pt was diagnosed with bipolar when she was around 32-61 years old and was seeing a psychiatrist and put on medication.Pts father stated that pt expressed SI about 10 years ago, but when she started taking her medications she was fine. Pts father denied knowledge of pt having and guns/weapons in her home.     Charlann Lange Hawk Mones MSW LCSW 03/07/2019, 12:18 PM

## 2019-03-08 LAB — LIPID PANEL
CHOLESTEROL: 148 mg/dL (ref 0–200)
HDL: 52 mg/dL (ref >40)
LDL Cholesterol: 73 mg/dL (ref 0–99)
Total CHOL/HDL Ratio: 2.8 RATIO
Triglycerides: 115 mg/dL (ref <150)
VLDL: 23 mg/dL (ref 0–40)

## 2019-03-08 LAB — TSH: TSH: 1.201 u[IU]/mL (ref 0.350–4.500)

## 2019-03-08 LAB — HEMOGLOBIN A1C
Hgb A1c MFr Bld: 5.3 % (ref 4.8–5.6)
Mean Plasma Glucose: 105.41 mg/dL

## 2019-03-08 MED ORDER — TRAZODONE HCL 100 MG PO TABS: 100.0000 mg | ORAL_TABLET | Freq: Every evening | ORAL | 1 refills | Status: DC | PRN

## 2019-03-08 MED ORDER — MIRTAZAPINE 15 MG PO TABS
30.0000 mg | ORAL_TABLET | Freq: Every day | ORAL | Status: DC
  Administered 2019-03-08: 30 mg via ORAL
  Filled 2019-03-08: qty 2

## 2019-03-08 MED ORDER — MIRTAZAPINE 30 MG PO TABS: 30.0000 mg | ORAL_TABLET | Freq: Every day | ORAL | 1 refills | Status: DC

## 2019-03-08 NOTE — Progress Notes (Signed)
Recreation Therapy Notes    Date: 03/08/2019  Time: 9:30 am   Location: Craft room   Behavioral response: N/A   Intervention Topic: Happiness  Discussion/Intervention: Patient did not attend group.   Clinical Observations/Feedback:  Patient did not attend group.   Nathalee Smarr LRT/CTRS        Heidee Audi 03/08/2019 10:53 AM 

## 2019-03-08 NOTE — Plan of Care (Signed)
D: Pt denies SI/HI/AV hallucinations. Pt is pleasant and cooperative. Pt has been in her room most of shift.  A: Pt was offered support and encouragement. Pt was given scheduled medications. Pt was encourage to attend groups. Q 15 minute checks were done for safety.  R:Pt attends groups and interacts well with peers and staff. Pt is taking medication. Pt has no complaints.Pt receptive to treatment and safety maintained on unit.   Problem: Medication: Goal: Compliance with prescribed medication regimen will improve Outcome: Progressing   Problem: Activity: Goal: Imbalance in normal sleep/wake cycle will improve Outcome: Progressing   Problem: Coping: Goal: Will verbalize feelings Outcome: Progressing

## 2019-03-08 NOTE — Plan of Care (Signed)
Patient aware  unit programing , verbalize no  concerns . Emotional and mental status improving . Voice of no safety concerns . Attending unit programing  able to verbalize  feelings. Working on coping and Psychologist, forensic  Able to identify anxiety concerns .  Compliant  with medication .Voice no concerns around sleep   Problem: Coping: Goal: Ability to identify and develop effective coping behavior will improve Outcome: Progressing   Problem: Self-Concept: Goal: Ability to identify factors that promote anxiety will improve Outcome: Progressing Goal: Level of anxiety will decrease Outcome: Progressing Goal: Ability to modify response to factors that promote anxiety will improve Outcome: Progressing   Problem: Education: Goal: Knowledge of [facility] Education information/materials will improve Outcome: Progressing Goal: Emotional status will improve Outcome: Progressing Goal: Mental status will improve Outcome: Progressing Goal: Verbalization of understanding the information provided will improve Outcome: Progressing   Problem: Education: Goal: Ability to make informed decisions regarding treatment will improve Outcome: Progressing   Problem: Coping: Goal: Coping ability will improve Outcome: Progressing   Problem: Medication: Goal: Compliance with prescribed medication regimen will improve Outcome: Progressing   Problem: Activity: Goal: Interest or engagement in leisure activities will improve Outcome: Progressing Goal: Imbalance in normal sleep/wake cycle will improve Outcome: Progressing   Problem: Coping: Goal: Coping ability will improve Outcome: Progressing Goal: Will verbalize feelings Outcome: Progressing

## 2019-03-08 NOTE — B[facility] Group Notes (Signed)
Feelings Around Diagnosis 03/08/2019 1PM  Type of Therapy/Topic:  Group Therapy:  Feelings about Diagnosis  Participation Level:  Active   Description of Group:   This group will allow patients to explore their thoughts and feelings about diagnoses they have received. Patients will be guided to explore their level of understanding and acceptance of these diagnoses. Facilitator will encourage patients to process their thoughts and feelings about the reactions of others to their diagnosis and will guide patients in identifying ways to discuss their diagnosis with significant others in their lives. This group will be process-oriented, with patients participating in exploration of their own experiences, giving and receiving support, and processing challenge from other group members.   Therapeutic Goals: 1. Patient will demonstrate understanding of diagnosis as evidenced by identifying two or more symptoms of the disorder 2. Patient will be able to express two feelings regarding the diagnosis 3. Patient will demonstrate their ability to communicate their needs through discussion and/or role play  Summary of Patient Progress: Actively and appropriately engaged in the group. Patient was able to provide support and validation to other group members.Patient practiced active listening when interacting with the facilitator and other group members.      Therapeutic Modalities:   Cognitive Behavioral Therapy Brief Therapy Feelings Identification    Suzan Slick, LCSW 03/08/2019 4:39 PM

## 2019-03-08 NOTE — Progress Notes (Signed)
D: Patient stated slept good last night .Stated appetite fair and energy level  low. Stated concentration poor . Stated on Depression scale 8 , hopeless 8 and anxiety 7 .( low 0-10 high) Denies suicidal  homicidal ideations  .  No auditory hallucinations  No pain concerns . Appropriate ADL'S. Interacting with peers and staff. Patient aware unit programing , verbalize no concerns . Emotional and mental status improving . Voice of no safety concerns . Attending unit programing able to verbalize feelings. Working on coping and Psychologist, forensic Able to identify anxiety concerns .  Compliant  with medication .Voice no concerns around sleep voice of Goal today " To feel better, I want to love myself so that I won't be  disappointed and feel rejected"  A: Encourage patient participation with unit programming . Instruction  Given on  Medication , verbalize understanding.  R: Voice no other concerns. Staff continue to monitor

## 2019-03-08 NOTE — Progress Notes (Signed)
[facility] MD Progress Note  03/08/2019 7:43 PM Mallory Johnston  MRN:  [id number] Subjective: Follow-up patient with depression.  Patient reports feeling slightly better.  Still depressed but no active suicidal thoughts.  No homicidal ideation.  Lucid.  Participates some on the unit.  Sleeping better. Principal Problem: Severe recurrent major depression without psychotic features (HCC) Diagnosis: Principal Problem:   Severe recurrent major depression without psychotic features (HCC) Active Problems:   PCOS (polycystic ovarian syndrome)   Von Willebrand's disease (HCC)  Total Time spent with patient: 30 minutes  Past Psychiatric History: History of depression but no significant past treatment that I can see.  Past Medical History:  Past Medical History:  Diagnosis Date  . H/O bilateral breast reduction surgery   . Lymphedema of left leg   . Ovarian cyst   . S/P tonsillectomy     Past Surgical History:  Procedure Laterality Date  . BREAST REDUCTION SURGERY    . OVARIAN CYST REMOVAL    . TONSILLECTOMY     Family History:  Family History  Problem Relation Age of Onset  . Hypertension Father   . Diabetes Mother   . Breast cancer Neg Hx   . Ovarian cancer Neg Hx   . Colon cancer Neg Hx    Family Psychiatric  History: 2 previous Social History:  Social History   Substance and Sexual Activity  Alcohol Use Yes   Comment: rare     Social History   Substance and Sexual Activity  Drug Use No    Social History   Socioeconomic History  . Marital status: Single    Spouse name: Not on file  . Number of children: Not on file  . Years of education: Not on file  . Highest education level: Not on file  Occupational History  . Not on file  Social Needs  . Financial resource strain: Not on file  . Food insecurity:    Worry: Not on file    Inability: Not on file  . Transportation needs:    Medical: Not on file    Non-medical: Not on file  Tobacco Use  . Smoking status: Never  Smoker  . Smokeless tobacco: Never Used  Substance and Sexual Activity  . Alcohol use: Yes    Comment: rare  . Drug use: No  . Sexual activity: Yes    Birth control/protection: Pill, Condom  Lifestyle  . Physical activity:    Days per week: Not on file    Minutes per session: Not on file  . Stress: Not on file  Relationships  . Social connections:    Talks on phone: Not on file    Gets together: Not on file    Attends religious service: Not on file    Active member of club or organization: Not on file    Attends meetings of clubs or organizations: Not on file    Relationship status: Not on file  Other Topics Concern  . Not on file  Social History Narrative  . Not on file   Additional Social History:    Pain Medications: MAR Prescriptions: MAR Over the Counter: MAR History of alcohol / drug use?: No history of alcohol / drug abuse Withdrawal Symptoms: Other (Comment)(does not smoke)                    Sleep: Fair  Appetite:  Fair  Current Medications: Current Facility-Administered Medications  Medication Dose Route Frequency Provider Last Rate Last Dose  .  alum & mag hydroxide-simeth (MAALOX/MYLANTA) 200-200-20 MG/5ML suspension 30 mL  30 mL Oral Q4H PRN Pucilowska, Jolanta B, MD      . hydrOXYzine (ATARAX/VISTARIL) tablet 50 mg  50 mg Oral TID PRN Pucilowska, Jolanta B, MD      . magnesium hydroxide (MILK OF MAGNESIA) suspension 30 mL  30 mL Oral Daily PRN Pucilowska, Jolanta B, MD      . mirtazapine (REMERON) tablet 30 mg  30 mg Oral QHS Clapacs, John T, MD      . norethindrone-ethinyl estradiol (JUNEL FE,GILDESS FE,LOESTRIN FE) 1-20 MG-MCG per tablet 1 tablet  1 tablet Oral Daily Pucilowska, Jolanta B, MD      . traZODone (DESYREL) tablet 100 mg  100 mg Oral QHS PRN Pucilowska, Jolanta B, MD        Lab Results:  Results for orders placed or performed during the hospital encounter of 03/07/19 (from the past 48 hour(s))  Hemoglobin A1c     Status: None    Collection Time: 03/08/19  6:48 AM  Result Value Ref Range   Hgb A1c MFr Bld 5.3 4.8 - 5.6 %    Comment: (NOTE) Pre diabetes:          5.7%-6.4% Diabetes:              >6.4% Glycemic control for   <7.0% adults with diabetes    Mean Plasma Glucose 105.41 mg/dL    Comment: Performed at [facility], [address], [place], [place] [address]  Lipid panel     Status: None   Collection Time: 03/08/19  6:48 AM  Result Value Ref Range   Cholesterol 148 0 - 200 mg/dL   Triglycerides 295 <621 mg/dL   HDL 52 >30 mg/dL   Total CHOL/HDL Ratio 2.8 RATIO   VLDL 23 0 - 40 mg/dL   LDL Cholesterol 73 0 - 99 mg/dL    Comment:        Total Cholesterol/HDL:CHD Risk Coronary Heart Disease Risk Table                     Men   Women  1/2 Average Risk   3.4   3.3  Average Risk       5.0   4.4  2 X Average Risk   9.6   7.1  3 X Average Risk  23.4   11.0        Use the calculated Patient Ratio above and the CHD Risk Table to determine the patient's CHD Risk.        ATP III CLASSIFICATION (LDL):  <100     mg/dL   Optimal  865-784  mg/dL   Near or Above                    Optimal  130-159  mg/dL   Borderline  696-295  mg/dL   High  >284     mg/dL   Very High Performed at [facility], [address] [place], [place] [address]   TSH     Status: None   Collection Time: 03/08/19  6:48 AM  Result Value Ref Range   TSH 1.201 0.350 - 4.500 uIU/mL    Comment: Performed by a 3rd Generation assay with a functional sensitivity of <=0.01 uIU/mL. Performed at [facility], [address], [place], [place] [address]     Blood Alcohol level:  Lab Results  Component Value Date   ETH <  10 03/05/2019    Metabolic Disorder Labs: Lab Results  Component Value Date   HGBA1C 5.3 03/08/2019   MPG 105.41 03/08/2019   No results found for: PROLACTIN Lab Results  Component Value Date   CHOL 148 03/08/2019   TRIG 115 03/08/2019   HDL 52 03/08/2019   CHOLHDL 2.8 03/08/2019    VLDL 23 03/08/2019   LDLCALC 73 03/08/2019    Physical Findings: AIMS: Facial and Oral Movements Muscles of Facial Expression: None, normal Lips and Perioral Area: None, normal Jaw: None, normal Tongue: None, normal,Extremity Movements Upper (arms, wrists, hands, fingers): None, normal Lower (legs, knees, ankles, toes): None, normal, Trunk Movements Neck, shoulders, hips: None, normal, Overall Severity Severity of abnormal movements (highest score from questions above): None, normal Incapacitation due to abnormal movements: None, normal Patient's awareness of abnormal movements (rate only patient's report): No Awareness, Dental Status Current problems with teeth and/or dentures?: No Does patient usually wear dentures?: No  CIWA:  CIWA-Ar Total: 2 COWS:  COWS Total Score: 3  Musculoskeletal: Strength & Muscle Tone: within normal limits Gait & Station: normal Patient leans: N/A  Psychiatric Specialty Exam: Physical Exam  Nursing note and vitals reviewed. Constitutional: She appears well-developed and well-nourished.  HENT:  Head: Normocephalic and atraumatic.  Eyes: Pupils are equal, round, and reactive to light. Conjunctivae are normal.  Neck: Normal range of motion.  Cardiovascular: Regular rhythm and normal heart sounds.  Respiratory: Effort normal. No respiratory distress.  GI: Soft.  Musculoskeletal: Normal range of motion.  Neurological: She is alert.  Skin: Skin is warm and dry.  Psychiatric: Judgment normal. Her affect is blunt. Her speech is delayed. She is slowed. Cognition and memory are normal. She expresses no homicidal and no suicidal ideation.    Review of Systems  Constitutional: Negative.   HENT: Negative.   Eyes: Negative.   Respiratory: Negative.   Cardiovascular: Negative.   Gastrointestinal: Negative.   Musculoskeletal: Negative.   Skin: Negative.   Neurological: Negative.   Psychiatric/Behavioral: Positive for depression. Negative for  hallucinations, memory loss, substance abuse and suicidal ideas. The patient is nervous/anxious and has insomnia.     Blood pressure 110/78, pulse 84, temperature 98 F (36.7 C), temperature source Oral, resp. rate 18, height 5\' 1"  (1.549 m), weight 83.9 kg, last menstrual period 02/18/2019, SpO2 100 %.Body mass index is 34.96 kg/m.  General Appearance: Casual  Eye Contact:  Good  Speech:  Clear and Coherent  Volume:  Decreased  Mood:  Dysphoric  Affect:  Congruent  Thought Process:  Goal Directed  Orientation:  Full (Time, Place, and Person)  Thought Content:  Logical  Suicidal Thoughts:  No  Homicidal Thoughts:  No  Memory:  Immediate;   Fair Recent;   Fair Remote;   Fair  Judgement:  Fair  Insight:  Fair  Psychomotor Activity:  Decreased  Concentration:  Concentration: Fair  Recall:  [employer] of Knowledge:  Fair  Language:  Fair  Akathisia:  No  Handed:  Right  AIMS (if indicated):     Assets:  Desire for Improvement  ADL's:  Intact  Cognition:  WNL  Sleep:  Number of Hours: 7.45     Treatment Plan Summary: Daily contact with patient to assess and evaluate symptoms and progress in treatment, Medication management and Plan Increase mirtazapine to 30 mg tonight.  Supportive counseling and review of plan.  Likely discharge tomorrow we will make preparations for a referral to outpatient treatment  Mordecai Rasmussen, MD 03/08/2019,  7:43 PM

## 2019-03-08 NOTE — BHH Suicide Risk Assessment (Signed)
[facility] Discharge Suicide Risk Assessment   Principal Problem: Severe recurrent major depression without psychotic features [facility]) Discharge Diagnoses: Principal Problem:   Severe recurrent major depression without psychotic features (HCC) Active Problems:   PCOS (polycystic ovarian syndrome)   Von Willebrand's disease (HCC)   Total Time spent with patient: 45 minutes  Musculoskeletal: Strength & Muscle Tone: within normal limits Gait & Station: normal Patient leans: N/A  Psychiatric Specialty Exam: Review of Systems  Constitutional: Negative.   HENT: Negative.   Eyes: Negative.   Respiratory: Negative.   Cardiovascular: Negative.   Gastrointestinal: Negative.   Musculoskeletal: Negative.   Skin: Negative.   Neurological: Negative.   Psychiatric/Behavioral: Positive for depression. Negative for hallucinations, memory loss, substance abuse and suicidal ideas. The patient is not nervous/anxious and does not have insomnia.     Blood pressure 110/78, pulse 84, temperature 98 F (36.7 C), temperature source Oral, resp. rate 18, height 5\' 1"  (1.549 m), weight 83.9 kg, last menstrual period 02/18/2019, SpO2 100 %.Body mass index is 34.96 kg/m.  General Appearance: Casual  Eye Contact::  Good  Speech:  Clear and Coherent409  Volume:  Normal  Mood:  Dysphoric  Affect:  Constricted  Thought Process:  Goal Directed  Orientation:  Full (Time, Place, and Person)  Thought Content:  Logical  Suicidal Thoughts:  No  Homicidal Thoughts:  No  Memory:  Immediate;   Fair Recent;   Fair Remote;   Fair  Judgement:  Fair  Insight:  Fair  Psychomotor Activity:  Decreased  Concentration:  Fair  Recall:  [employer] of Knowledge:Fair  Language: Fair  Akathisia:  No  Handed:  Right  AIMS (if indicated):     Assets:  Desire for Improvement Housing Resilience  Sleep:  Number of Hours: 7.45  Cognition: WNL  ADL's:  Intact   Mental Status Per Nursing Assessment::   On Admission:   Suicidal ideation indicated by patient  Demographic Factors:  Living alone  Loss Factors: Financial problems/change in socioeconomic status  Historical Factors: Anniversary of important loss and Impulsivity  Risk Reduction Factors:   Responsible for children under 57 years of age, Sense of responsibility to family and Religious beliefs about death  Continued Clinical Symptoms:  Depression:   Anhedonia  Cognitive Features That Contribute To Risk:  Polarized thinking    Suicide Risk:  Minimal: No identifiable suicidal ideation.  Patients presenting with no risk factors but with morbid ruminations; may be classified as minimal risk based on the severity of the depressive symptoms  Follow-up Information    [facility] Go on 03/16/2019.   Why:  Please follow up with [facility] on 03/16/19 at 2:30pm. Please bring your hospital discharge paperwork with you to your appointment. Thank you Contact information: [address] [place] [address] [phone]           Plan Of Care/Follow-up recommendations:  Activity:  Activity as tolerated Diet:  Regular diet Other:  Outpatient referral to [facility].  Mordecai Rasmussen, MD 03/08/2019, 7:47 PM

## 2019-03-09 NOTE — Progress Notes (Signed)
  [facility] Adult Case Management Discharge Plan :  Will you be returning to the same living situation after discharge:  Yes,  home At discharge, do you have transportation home?: Yes,  Car is in the parking lot Do you have the ability to pay for your medications: Yes,  [employer] insurance  Release of information consent forms completed and in the chart;    Patient to Follow up at: Follow-up Information    [facility] Go on 03/16/2019.   Why:  Please follow up with [facility] on 03/16/19 at 2:30pm. Please bring your hospital discharge paperwork with you to your appointment. Thank you Contact information: [address] [place] [address] [phone]           Next level of care provider has access to [facility] Link:no  Safety Planning and Suicide Prevention discussed: Yes,  SPE completed with pt and her father  Have you used any form of tobacco in the last 30 days? (Cigarettes, Smokeless Tobacco, Cigars, and/or Pipes): No  Has patient been referred to the Quitline?: N/A patient is not a smoker  Patient has been referred for addiction treatment: N/A  Mechele Dawley, LCSW 03/09/2019, 9:26 AM

## 2019-03-09 NOTE — Progress Notes (Signed)
Recreation Therapy Notes    Date: 03/09/2019  Time: 9:30 am   Location: Craft room   Behavioral response: N/A   Intervention Topic: Self-esteem  Discussion/Intervention: Patient did not attend group.   Clinical Observations/Feedback:  Patient did not attend group.   Dorr Perrot LRT/CTRS         Jenaro Souder 03/09/2019 10:54 AM

## 2019-03-09 NOTE — Progress Notes (Signed)
D: Patient is aware of  Discharge this shift .Patient denies suicidal /homicidal ideations. Patient received all belongings brought in  A: No Storage medications. Writer reviewed Discharge Summary, Suicide Risk Assessment, and Transitional Record. Patient also received Prescriptions   from  MD.  Aware  Of follow up appointment . R: Patient left unit with no questions  Or concerns  Drove her car away from campus.

## 2019-03-09 NOTE — Progress Notes (Signed)
Patient is in the milieu with peers socializing and playing check board games with out issues, well rested stable and doing well in the unit ,patient states "I doing fine" voice no concerns , mood is bright medication is taken with out any side effects, safety is maintained and patient denies any SI/HI/AVH, states appetite is good and sleep is continuous with out any interruptions . Patient states that her depression is less 3/10 scale.and her medication is working right for her, patient is reminded of 15 minutes safety rounding in progress noted.

## 2019-03-09 NOTE — Discharge Summary (Signed)
Physician Discharge Summary Note  Patient:  Mallory Johnston is an 28 y.o., female MRN:  [id number] DOB:  01-19-1991 Patient phone:  [phone] (home)  Patient address:   [address] [place] [address],  Total Time spent with patient: 45 minutes  Date of Admission:  03/07/2019 Date of Discharge: March 09, 2019  Reason for Admission: Patient admitted because of reports of depressed mood sadness suicidal ideation.  Principal Problem: Severe recurrent major depression without psychotic features [facility]) Discharge Diagnoses: Principal Problem:   Severe recurrent major depression without psychotic features (HCC) Active Problems:   PCOS (polycystic ovarian syndrome)   Von Willebrand's disease (HCC)   Past Psychiatric History: Patient has some history of depression.  Past Medical History:  Past Medical History:  Diagnosis Date  . H/O bilateral breast reduction surgery   . Lymphedema of left leg   . Ovarian cyst   . S/P tonsillectomy     Past Surgical History:  Procedure Laterality Date  . BREAST REDUCTION SURGERY    . OVARIAN CYST REMOVAL    . TONSILLECTOMY     Family History:  Family History  Problem Relation Age of Onset  . Hypertension Father   . Diabetes Mother   . Breast cancer Neg Hx   . Ovarian cancer Neg Hx   . Colon cancer Neg Hx    Family Psychiatric  History: See previous Social History:  Social History   Substance and Sexual Activity  Alcohol Use Yes   Comment: rare     Social History   Substance and Sexual Activity  Drug Use No    Social History   Socioeconomic History  . Marital status: Single    Spouse name: Not on file  . Number of children: Not on file  . Years of education: Not on file  . Highest education level: Not on file  Occupational History  . Not on file  Social Needs  . Financial resource strain: Not on file  . Food insecurity:    Worry: Not on file    Inability: Not on file  . Transportation needs:    Medical:  Not on file    Non-medical: Not on file  Tobacco Use  . Smoking status: Never Smoker  . Smokeless tobacco: Never Used  Substance and Sexual Activity  . Alcohol use: Yes    Comment: rare  . Drug use: No  . Sexual activity: Yes    Birth control/protection: Pill, Condom  Lifestyle  . Physical activity:    Days per week: Not on file    Minutes per session: Not on file  . Stress: Not on file  Relationships  . Social connections:    Talks on phone: Not on file    Gets together: Not on file    Attends religious service: Not on file    Active member of club or organization: Not on file    Attends meetings of clubs or organizations: Not on file    Relationship status: Not on file  Other Topics Concern  . Not on file  Social History Narrative  . Not on file    Hospital Course: Patient was admitted to the psychiatric ward.  15-minute checks continued.  Engaged in individual and group therapy.  Medications were provided starting her on antidepressants.  Medicines well-tolerated without any side effects.  Patient did not show any dangerous or aggressive behavior on the unit.  She was feeling much better without any current suicidal ideation and with  good optimism at discharge.  She is discharged with plan to follow-up with local provider and continue current medicine  Physical Findings: AIMS: Facial and Oral Movements Muscles of Facial Expression: None, normal Lips and Perioral Area: None, normal Jaw: None, normal Tongue: None, normal,Extremity Movements Upper (arms, wrists, hands, fingers): None, normal Lower (legs, knees, ankles, toes): None, normal, Trunk Movements Neck, shoulders, hips: None, normal, Overall Severity Severity of abnormal movements (highest score from questions above): None, normal Incapacitation due to abnormal movements: None, normal Patient's awareness of abnormal movements (rate only patient's report): No Awareness, Dental Status Current problems with teeth  and/or dentures?: No Does patient usually wear dentures?: No  CIWA:  CIWA-Ar Total: 2 COWS:  COWS Total Score: 3  Musculoskeletal: Strength & Muscle Tone: within normal limits Gait & Station: normal Patient leans: N/A  Psychiatric Specialty Exam: Physical Exam  Nursing note and vitals reviewed. Constitutional: She appears well-developed and well-nourished.  HENT:  Head: Normocephalic and atraumatic.  Eyes: Pupils are equal, round, and reactive to light. Conjunctivae are normal.  Neck: Normal range of motion.  Cardiovascular: Regular rhythm and normal heart sounds.  Respiratory: Effort normal. No respiratory distress.  GI: Soft.  Musculoskeletal: Normal range of motion.  Neurological: She is alert.  Skin: Skin is warm and dry.  Psychiatric: She has a normal mood and affect. Her behavior is normal. Judgment and thought content normal.    Review of Systems  Constitutional: Negative.   HENT: Negative.   Eyes: Negative.   Respiratory: Negative.   Cardiovascular: Negative.   Gastrointestinal: Negative.   Musculoskeletal: Negative.   Skin: Negative.   Neurological: Negative.   Psychiatric/Behavioral: Negative.     Blood pressure (!) 122/92, pulse 88, temperature 97.9 F (36.6 C), temperature source Oral, resp. rate 18, height  (1.549 m), weight 83.9 kg, last menstrual period 02/18/2019, SpO2 100 %.Body mass index is 34.96 kg/m.  General Appearance: Casual  Eye Contact:  Good  Speech:  Clear and Coherent  Volume:  Normal  Mood:  Euthymic  Affect:  Constricted  Thought Process:  Goal Directed  Orientation:  Full (Time, Place, and Person)  Thought Content:  Logical  Suicidal Thoughts:  No  Homicidal Thoughts:  No  Memory:  Immediate;   Fair Recent;   Fair Remote;   Fair  Judgement:  Fair  Insight:  Fair  Psychomotor Activity:  Normal  Concentration:  Concentration: Fair  Recall:  Fair  Fund of Knowledge:  Fair  Language:  Fair  Akathisia:  No  Handed:  Right   AIMS (if indicated):     Assets:  Desire for Improvement Housing Physical Health Resilience  ADL's:  Intact  Cognition:  WNL  Sleep:  Number of Hours: 7     Have you used any form of tobacco in the last 30 days? (Cigarettes, Smokeless Tobacco, Cigars, and/or Pipes): No  Has this patient used any form of tobacco in the last 30 days? (Cigarettes, Smokeless Tobacco, Cigars, and/or Pipes) Yes, No  Blood Alcohol level:  Lab Results  Component Value Date   ETH <10 03/05/2019    Metabolic Disorder Labs:  Lab Results  Component Value Date   HGBA1C 5.3 03/08/2019   MPG 105.41 03/08/2019   No results found for: PROLACTIN Lab Results  Component Value Date   CHOL 148 03/08/2019   TRIG 115 03/08/2019   HDL 52 03/08/2019   CHOLHDL 2.8 03/08/2019   VLDL 23 03/08/2019   LDLCALC 73 03/08/2019  See Psychiatric Specialty Exam and Suicide Risk Assessment completed by Attending Physician prior to discharge.  Discharge destination:  Home  Is patient on multiple antipsychotic therapies at discharge:  No   Has Patient had three or more failed trials of antipsychotic monotherapy by history:  No  Recommended Plan for Multiple Antipsychotic Therapies: NA  Discharge Instructions    Diet - low sodium heart healthy   Complete by:  As directed    Increase activity slowly   Complete by:  As directed      Allergies as of 03/09/2019      Reactions   Amoxicillin Rash   Percocet [oxycodone-acetaminophen] Nausea And Vomiting, Rash      Medication List    STOP taking these medications   norethindrone-ethinyl estradiol 1-20 MG-MCG tablet Commonly known as:  Junel FE 1/20     TAKE these medications     Indication  mirtazapine 30 MG tablet Commonly known as:  REMERON Take 1 tablet (30 mg total) by mouth at bedtime.  Indication:  Major Depressive Disorder   traZODone 100 MG tablet Commonly known as:  DESYREL Take 1 tablet (100 mg total) by mouth at bedtime as needed for sleep.   Indication:  Trouble Sleeping      Follow-up Information    [facility] [facility] Go on 03/16/2019.   Why:  Please follow up with [facility] on 03/16/19 at 2:30pm. Please bring your hospital discharge paperwork with you to your appointment. Thank you Contact information: [address] [place] [address] [phone]           Follow-up recommendations:  Activity:  Activity as tolerated Diet:  Regular diet Other:  Continue current antidepressant medicine.  Follow-up with therapy and medication management at [facility].  Comments: Prescriptions provided at discharge  Signed: Mordecai Rasmussen, MD 03/09/2019, 5:38 PM

## 2019-03-16 DIAGNOSIS — F331 Major depressive disorder, recurrent, moderate: Secondary | ICD-10-CM | POA: Diagnosis not present

## 2019-03-18 DIAGNOSIS — F331 Major depressive disorder, recurrent, moderate: Secondary | ICD-10-CM | POA: Diagnosis not present

## 2019-03-25 DIAGNOSIS — F331 Major depressive disorder, recurrent, moderate: Secondary | ICD-10-CM | POA: Diagnosis not present

## 2019-04-06 ENCOUNTER — Telehealth: Payer: Self-pay

## 2019-04-06 NOTE — Telephone Encounter (Signed)
pt is trying to get a hold of you she states that she need a refill on her medication because she got an appt for the end of may for her doctor.

## 2019-04-08 ENCOUNTER — Encounter: Payer: Self-pay | Admitting: Internal Medicine

## 2019-04-08 ENCOUNTER — Ambulatory Visit (INDEPENDENT_AMBULATORY_CARE_PROVIDER_SITE_OTHER): Payer: BLUE CROSS/BLUE SHIELD | Admitting: Internal Medicine

## 2019-04-08 ENCOUNTER — Other Ambulatory Visit: Payer: Self-pay | Admitting: Psychiatry

## 2019-04-08 ENCOUNTER — Telehealth: Payer: Self-pay

## 2019-04-08 ENCOUNTER — Other Ambulatory Visit: Payer: Self-pay

## 2019-04-08 VITALS — BP 136/84 | HR 88 | Resp 16 | Ht 62.0 in | Wt 195.0 lb

## 2019-04-08 DIAGNOSIS — F332 Major depressive disorder, recurrent severe without psychotic features: Secondary | ICD-10-CM | POA: Diagnosis not present

## 2019-04-08 DIAGNOSIS — E282 Polycystic ovarian syndrome: Secondary | ICD-10-CM

## 2019-04-08 DIAGNOSIS — D6800 Von Willebrand disease, unspecified: Secondary | ICD-10-CM

## 2019-04-08 DIAGNOSIS — F331 Major depressive disorder, recurrent, moderate: Secondary | ICD-10-CM | POA: Diagnosis not present

## 2019-04-08 MED ORDER — MIRTAZAPINE 30 MG PO TABS: 30.0000 mg | ORAL_TABLET | Freq: Every day | ORAL | 0 refills | Status: DC

## 2019-04-08 MED ORDER — TRAZODONE HCL 100 MG PO TABS: 100.0000 mg | ORAL_TABLET | Freq: Every evening | ORAL | 0 refills | Status: DC | PRN

## 2019-04-08 NOTE — Progress Notes (Signed)
Date:  04/08/2019   Name:  Mallory Johnston   DOB:  1991-09-14   MRN:  [id number]   Chief Complaint: Depression (ED visit Suicide thoughts 03/07/2019); Anxiety; and Insomnia This encounter was conducted via video encounter due to the need for social distancing in light of the Covid-19 pandemic.  The patient was correctly identified.  I advised that I am conducting the visit from a secure room in my office at [facility] clinic.   The limitations of this form of encounter were discussed with the patient and he/she agreed to proceed.  Depression         This is a new problem.  The current episode started more than 1 month ago.   The problem has been gradually improving since onset.  Associated symptoms include appetite change.  Associated symptoms include no headaches. Pt was hospitalized for suicidal thoughts, depression, anxiety and insomnia in early March.  She is slowly improving, discharged on Remeron and Trazodone and has follow up with Psych in 2 weeks.  However, she is running out of medications and needs refill until she is seen. She is no longer having suicidal thoughts but still has negative thoughts about herself.  Her sleep is improved, she has occasional brief waking spells. Her appointment is at [facility] 04/18/19.  PCOS - she has been on OCPs for ovarian cysts and pelvic pain and to control heaving menstrual bleeding.  When she was admitted to [facility], they stopped her OCPs.  Since then she has had minimal pelvic pain but wants to resume the medication.  She also has VWD and has heavier menses but no spontaneous nose bleeds or other bleeding.  Review of Systems  Constitutional: Positive for appetite change. Negative for diaphoresis and fever.  HENT:       Mild dry mouth  Eyes: Negative for visual disturbance.  Respiratory: Negative for shortness of breath and wheezing.   Cardiovascular: Negative for chest pain, palpitations and leg swelling.  Gastrointestinal: Negative for abdominal  pain, constipation and diarrhea.  Genitourinary: Negative for hematuria and pelvic pain.  Skin: Negative for rash.  Neurological: Negative for dizziness, light-headedness and headaches.  Psychiatric/Behavioral: Positive for depression and dysphoric mood. Negative for sleep disturbance. The patient is nervous/anxious.     Patient Active Problem List   Diagnosis Date Noted  . Severe recurrent major depression without psychotic features (HCC) 03/07/2019  . Suicidal ideation 03/06/2019  . Noncompliance 03/06/2019  . Class 1 obesity due to excess calories without serious comorbidity with body mass index (BMI) of 30.0 to 30.9 in adult 02/11/2017  . History of cervical dysplasia 02/11/2017  . Liver lesion, right lobe 10/17/2016  . Von Willebrand's disease (HCC) 11/10/2015  . PCOS (polycystic ovarian syndrome) 09/20/2015    Allergies  Allergen Reactions  . Amoxicillin Rash  . Percocet [Oxycodone-Acetaminophen] Nausea And Vomiting and Rash    Past Surgical History:  Procedure Laterality Date  . BREAST REDUCTION SURGERY    . OVARIAN CYST REMOVAL    . TONSILLECTOMY      Social History   Tobacco Use  . Smoking status: Never Smoker  . Smokeless tobacco: Never Used  Substance Use Topics  . Alcohol use: Yes    Comment: rare  . Drug use: No     Medication list has been reviewed and updated.  Current Meds  Medication Sig  . mirtazapine (REMERON) 30 MG tablet Take 1 tablet (30 mg total) by mouth at bedtime.  . traZODone (DESYREL) 100  MG tablet Take 1 tablet (100 mg total) by mouth at bedtime as needed for sleep.    PHQ 2/9 Scores 04/08/2019 10/27/2017  PHQ - 2 Score 2 0  PHQ- 9 Score 13 -   GAD 7 : Generalized Anxiety Score 04/08/2019  Nervous, Anxious, on Edge 3  Control/stop worrying 3  Worry too much - different things 0  Trouble relaxing 1  Restless 3  Easily annoyed or irritable 1  Afraid - awful might happen 2  Total GAD 7 Score 13     BP Readings from Last 3  Encounters:  04/08/19 136/84  03/09/19 (!) 122/92  03/07/19 113/77    Physical Exam Vitals signs and nursing note reviewed.  Constitutional:      General: She is not in acute distress.    Appearance: Normal appearance. She is well-developed and well-groomed.  HENT:     Head: Normocephalic and atraumatic.  Pulmonary:     Effort: Pulmonary effort is normal. No respiratory distress.  Neurological:     Mental Status: She is alert and oriented to person, place, and time.  Psychiatric:        Behavior: Behavior normal.        Thought Content: Thought content normal.     Wt Readings from Last 3 Encounters:  04/08/19 195 lb (88.5 kg)  03/07/19 185 lb (83.9 kg)  03/05/19 185 lb (83.9 kg)    BP 136/84   Pulse 88   Resp 16   Ht 5\' 2"  (1.575 m)   Wt 195 lb (88.5 kg) Comment: pt reports  BMI 35.67 kg/m   Assessment and Plan: 1. Severe recurrent major depression without psychotic features (HCC) Continue current treatment and keep follow up appt with Psych - mirtazapine (REMERON) 30 MG tablet; Take 1 tablet (30 mg total) by mouth at bedtime.  Dispense: 30 tablet; Refill: 0 - traZODone (DESYREL) 100 MG tablet; Take 1 tablet (100 mg total) by mouth at bedtime as needed for sleep.  Dispense: 30 tablet; Refill: 0  2. PCOS (polycystic ovarian syndrome) Should be able to resume OCPs but she should ask Psych at her appointment  3. Von Willebrand's disease (HCC) No spontaneous bleeding issues Pt has never been treated with DDAVP  I spent 13 minutes on this encounter.  Partially dictated using Animal nutritionistDragon software. Any errors are unintentional.  Bari EdwardLaura Fynley Chrystal, MD [facility]  04/08/2019

## 2019-04-08 NOTE — Telephone Encounter (Signed)
LM that she needs a zoom meeting to discuss Anxiety and Depression. Called back and advised we can do Video Visit at 2 pm

## 2019-04-12 ENCOUNTER — Telehealth: Payer: Self-pay

## 2019-04-12 NOTE — Telephone Encounter (Signed)
Rcvd papers from [employer]. Requested by [employer]. Appear to be Medical Leave forms. I called to tell patient PCP refusing to fill out forms and she will need to allow Psych to fill out fomrs as they wil be treating patient moving forward. Hospital began treating and we did short term refills and tele visit just to help until she gets seen in late April for Major Depression. I left her message regarding this and to call us to let us know if she needs these forms back. Will await call back and See Asher Muir for form.

## 2019-04-15 DIAGNOSIS — F331 Major depressive disorder, recurrent, moderate: Secondary | ICD-10-CM | POA: Diagnosis not present

## 2019-04-29 DIAGNOSIS — F331 Major depressive disorder, recurrent, moderate: Secondary | ICD-10-CM | POA: Diagnosis not present

## 2019-05-02 DIAGNOSIS — F331 Major depressive disorder, recurrent, moderate: Secondary | ICD-10-CM | POA: Diagnosis not present

## 2019-05-06 DIAGNOSIS — F331 Major depressive disorder, recurrent, moderate: Secondary | ICD-10-CM | POA: Diagnosis not present

## 2019-05-18 ENCOUNTER — Telehealth: Payer: Self-pay

## 2019-05-18 NOTE — Telephone Encounter (Signed)
Received STD forms for patient from [employer]. Called patient and informed we do not do STD forms. And her psychiatrist would be the one to fill these out since they are following her for her depression.  She verbalized understanding and asked that we mail her the forms to her home.   Mailed forms after verifying patient current address is the same.

## 2019-05-20 ENCOUNTER — Telehealth: Payer: Self-pay

## 2019-05-20 NOTE — Telephone Encounter (Signed)
Patient asking that we fill out accommodation forms for her job.Advised that those are not much different than STD and since we did not treat her for her Mental Health or take her out of work we can not fill those out. She said Psychiatrist refused to sign off and her job is on line. She is not needing any special accomodation and needs that form to say that but no one will sign. I advised I will send note to PCP and have her and Chassidy discuss best options and call her back Tue or Wed.

## 2019-05-24 ENCOUNTER — Encounter: Payer: BLUE CROSS/BLUE SHIELD | Admitting: Obstetrics and Gynecology

## 2019-05-24 NOTE — Telephone Encounter (Signed)
I do not understand this message.  If she does not need any accomodation, then her job can not be at risk if she doesn't have a form completed.  If it is more to do with some work that she missed, I did not take her out and therefore will not clear her to return.

## 2019-05-25 NOTE — Telephone Encounter (Signed)
Called and left VM. Awaiting cb. Told patient we cannot put her back into work because we did not take her out. Told her whoever to take her out of work needs to put her back in. We do not know why she stopped working.

## 2019-06-09 ENCOUNTER — Encounter: Payer: Self-pay | Admitting: Obstetrics and Gynecology

## 2019-06-09 ENCOUNTER — Other Ambulatory Visit: Payer: Self-pay

## 2019-06-09 ENCOUNTER — Ambulatory Visit (INDEPENDENT_AMBULATORY_CARE_PROVIDER_SITE_OTHER): Payer: BC Managed Care – PPO | Admitting: Obstetrics and Gynecology

## 2019-06-09 VITALS — BP 112/66 | HR 89 | Ht 61.0 in | Wt 206.8 lb

## 2019-06-09 DIAGNOSIS — Z30011 Encounter for initial prescription of contraceptive pills: Secondary | ICD-10-CM

## 2019-06-09 DIAGNOSIS — Z0000 Encounter for general adult medical examination without abnormal findings: Secondary | ICD-10-CM | POA: Diagnosis not present

## 2019-06-09 MED ORDER — NORETHIN ACE-ETH ESTRAD-FE 1-20 MG-MCG PO TABS: 1.0000 | ORAL_TABLET | Freq: Every day | ORAL | 3 refills | Status: DC

## 2019-06-09 NOTE — Progress Notes (Signed)
HPI:      [name] is a 28 y.o. G1P0010 who LMP was Mallory Johnston's last menstrual period was 05/21/2019.  Subjective:   She presents today for her annual examination.  She recently had a depressive/anxiety episode was admitted to the hospital because of suicidal ideation.  She was begun on antidepressant medication and she says she is doing very well right now.  She would like to restart OCPs both for her PCO and von Willebrand's disease.  She is not currently sexually active.    Hx: The following portions of the Mallory Johnston's history were reviewed and updated as appropriate:             She  has a past medical history of H/O bilateral breast reduction surgery, Lymphedema of left leg, Ovarian cyst, and S/P tonsillectomy. She does not have any pertinent problems on file. She  has a past surgical history that includes Tonsillectomy; Breast reduction surgery; and Ovarian cyst removal. Her family history includes Diabetes in her mother; Hypertension in her father. She  reports that she has never smoked. She has never used smokeless tobacco. She reports current alcohol use. She reports that she does not use drugs. She has a current medication list which includes the following prescription(s): mirtazapine, trazodone, and norethindrone-ethinyl estradiol. She is allergic to amoxicillin and percocet [oxycodone-acetaminophen].       Review of Systems:  Review of Systems  Constitutional: Denied constitutional symptoms, night sweats, recent illness, fatigue, fever, insomnia and weight loss.  Eyes: Denied eye symptoms, eye pain, photophobia, vision change and visual disturbance.  Ears/Nose/Throat/Neck: Denied ear, nose, throat or neck symptoms, hearing loss, nasal discharge, sinus congestion and sore throat.  Cardiovascular: Denied cardiovascular symptoms, arrhythmia, chest pain/pressure, edema, exercise intolerance, orthopnea and palpitations.  Respiratory: Denied pulmonary symptoms, asthma, pleuritic  pain, productive sputum, cough, dyspnea and wheezing.  Gastrointestinal: Denied, gastro-esophageal reflux, melena, nausea and vomiting.  Genitourinary: Denied genitourinary symptoms including symptomatic vaginal discharge, pelvic relaxation issues, and urinary complaints.  Musculoskeletal: Denied musculoskeletal symptoms, stiffness, swelling, muscle weakness and myalgia.  Dermatologic: Denied dermatology symptoms, rash and scar.  Neurologic: Denied neurology symptoms, dizziness, headache, neck pain and syncope.  Psychiatric: Denied psychiatric symptoms, anxiety and depression.  Endocrine: Denied endocrine symptoms including hot flashes and night sweats.   Meds:   Current Outpatient Medications on File Prior to Visit  Medication Sig Dispense Refill  . mirtazapine (REMERON) 30 MG tablet Take 1 tablet (30 mg total) by mouth at bedtime. 30 tablet 0  . traZODone (DESYREL) 100 MG tablet Take 1 tablet (100 mg total) by mouth at bedtime as needed for sleep. 30 tablet 0   No current facility-administered medications on file prior to visit.     Objective:     Vitals:   06/09/19 0832  BP: 112/66  Pulse: 89              Physical examination General NAD, Conversant  HEENT Atraumatic; Op clear with mmm.  Normo-cephalic. Pupils reactive. Anicteric sclerae  Thyroid/Neck Smooth without nodularity or enlargement. Normal ROM.  Neck Supple.  Skin No rashes, lesions or ulceration. Normal palpated skin turgor. No nodularity.  Breasts: No masses or discharge.  Symmetric.  No axillary adenopathy.  Status post breast reduction surgery  Lungs: Clear to auscultation.No rales or wheezes. Normal Respiratory effort, no retractions.  Heart: NSR.  No murmurs or rubs appreciated. No periferal edema  Abdomen: Soft.  Non-tender.  No masses.  No HSM. No hernia  Extremities: Moves all  appropriately.  Normal ROM for age. No lymphadenopathy.  Neuro: Oriented to PPT.  Normal mood. Normal affect.     Pelvic:    Vulva: Normal appearance.  No lesions.  Vagina: No lesions or abnormalities noted.  Support: Normal pelvic support.  Urethra No masses tenderness or scarring.  Meatus Normal size without lesions or prolapse.  Cervix: Normal appearance.  No lesions.  Anus: Normal exam.  No lesions.  Perineum: Normal exam.  No lesions.        Bimanual   Uterus: Normal size.  Non-tender.  Mobile.  AV.  Adnexae: No masses.  Non-tender to palpation.  Cul-de-sac: Negative for abnormality.   Entire exam limited by Mallory Johnston body habitus   Assessment:    G1P0010 Mallory Johnston Active Problem List   Diagnosis Date Noted  . Severe recurrent major depression without psychotic features (HCC) 03/07/2019  . Suicidal ideation 03/06/2019  . Noncompliance 03/06/2019  . Class 1 obesity due to excess calories without serious comorbidity with body mass index (BMI) of 30.0 to 30.9 in adult 02/11/2017  . History of cervical dysplasia 02/11/2017  . Liver lesion, right lobe 10/17/2016  . Von Willebrand's disease (HCC) 11/10/2015  . PCOS (polycystic ovarian syndrome) 09/20/2015     1. Encounter for annual physical exam   2. Initiation of OCP (BCP)        Plan:            1.  Basic Screening Recommendations The basic screening recommendations for asymptomatic women were discussed with the Mallory Johnston during her visit.  The age-appropriate recommendations were discussed with her and the rational for the tests reviewed.  When I am informed by the Mallory Johnston that another primary care physician has previously obtained the age-appropriate tests and they are up-to-date, only outstanding tests are ordered and referrals given as necessary.  Abnormal results of tests will be discussed with her when all of her results are completed. 2.  Restart OCPs Orders No orders of the defined types were placed in this encounter.    Meds ordered this encounter  Medications  . norethindrone-ethinyl estradiol (JUNEL FE 1/20) 1-20 MG-MCG tablet     Sig: Take 1 tablet by mouth daily.    Dispense:  3 Package    Refill:  3        F/U  Return in about 1 year (around 06/08/2020) for Annual Physical.  Elonda Huskyavid J. Danial Hlavac, M.D. 06/09/2019 8:52 AM

## 2019-06-09 NOTE — Progress Notes (Signed)
Patient comes in today for her annual exam. She is not due for labs or pap.

## 2019-06-29 ENCOUNTER — Other Ambulatory Visit: Payer: Self-pay | Admitting: Psychiatry

## 2019-06-29 DIAGNOSIS — F332 Major depressive disorder, recurrent severe without psychotic features: Secondary | ICD-10-CM

## 2019-08-06 ENCOUNTER — Other Ambulatory Visit: Payer: Self-pay | Admitting: Psychiatry

## 2019-08-06 DIAGNOSIS — F332 Major depressive disorder, recurrent severe without psychotic features: Secondary | ICD-10-CM

## 2019-11-11 ENCOUNTER — Encounter: Payer: Self-pay | Admitting: *Deleted

## 2019-11-11 ENCOUNTER — Telehealth: Payer: Self-pay

## 2019-11-11 ENCOUNTER — Encounter: Payer: Self-pay | Admitting: Internal Medicine

## 2019-11-11 ENCOUNTER — Ambulatory Visit
Admission: EM | Admit: 2019-11-11 | Discharge: 2019-11-11 | Disposition: A | Discharge status: Home / Self Care | Payer: 59

## 2019-11-11 DIAGNOSIS — R2243 Localized swelling, mass and lump, lower limb, bilateral: Secondary | ICD-10-CM | POA: Diagnosis not present

## 2019-11-11 DIAGNOSIS — I890 Lymphedema, not elsewhere classified: Secondary | ICD-10-CM | POA: Diagnosis not present

## 2019-11-11 NOTE — Discharge Instructions (Addendum)
Put your compression stockings on first thing in the morning before you get out of bed. Use the sequential compression pump twice a day as directed by your Vein and Vascular specialist. Rest and elevate your feet as you are able.  Follow a low-sodium diet. See the information attached.    Call the Vein and Vascular specialist to schedule an appointment for follow-up.

## 2019-11-11 NOTE — ED Triage Notes (Signed)
Patient has hx of lymphedema to left lower extremity, states over the last week. Patient has picture of lower extremity showing large amount of swelling to left foot after working. Patient has been using brace and compression socks.

## 2019-11-11 NOTE — ED Provider Notes (Signed)
Renaldo Fiddler    CSN: [id number] Arrival date & time: 11/11/19  1123      History   Chief Complaint Chief Complaint  Patient presents with  . Leg Swelling    HPI Mallory Johnston is a 28 y.o. female.  Patient presents with swelling in bilateral lower legs, worse in the left. She reports this is a chronic issue but has been worse over the past week. She states she has seen [facility] for this in the past and was diagnosed with lymphedema; she is supposed to wear compression stockings and use a sequential compression pump; she is not currently wearing the stockings. She denies numbness, tingling, weakness in her LE. No other treatments attempted at home.  The history is provided by the patient.    Past Medical History:  Diagnosis Date  . H/O bilateral breast reduction surgery   . Lymphedema of left leg   . Ovarian cyst   . S/P tonsillectomy     Patient Active Problem List   Diagnosis Date Noted  . Severe recurrent major depression without psychotic features (HCC) 03/07/2019  . Suicidal ideation 03/06/2019  . Noncompliance 03/06/2019  . Class 1 obesity due to excess calories without serious comorbidity with body mass index (BMI) of 30.0 to 30.9 in adult 02/11/2017  . History of cervical dysplasia 02/11/2017  . Liver lesion, right lobe 10/17/2016  . Von Willebrand's disease (HCC) 11/10/2015  . PCOS (polycystic ovarian syndrome) 09/20/2015    Past Surgical History:  Procedure Laterality Date  . BREAST REDUCTION SURGERY    . OVARIAN CYST REMOVAL    . TONSILLECTOMY      OB History    Gravida  1   Para      Term      Preterm      AB  1   Living        SAB  1   TAB      Ectopic      Multiple      Live Births               Home Medications    Prior to Admission medications   Medication Sig Start Date End Date Taking? Authorizing Provider  mirtazapine (REMERON) 30 MG tablet Take 1 tablet (30 mg total) by mouth at bedtime.  04/08/19   Clapacs, Jackquline Denmark, MD  norethindrone-ethinyl estradiol (JUNEL FE 1/20) 1-20 MG-MCG tablet Take 1 tablet by mouth daily. 06/09/19   Linzie Collin, MD  traZODone (DESYREL) 100 MG tablet TAKE 1 TABLET (100 MG TOTAL) BY MOUTH AT BEDTIME AS NEEDED FOR SLEEP. 07/08/19   Clapacs, Jackquline Denmark, MD    Family History Family History  Problem Relation Age of Onset  . Hypertension Father   . Diabetes Mother   . Breast cancer Neg Hx   . Ovarian cancer Neg Hx   . Colon cancer Neg Hx     Social History Social History   Tobacco Use  . Smoking status: Never Smoker  . Smokeless tobacco: Never Used  Substance Use Topics  . Alcohol use: Yes    Comment: rare  . Drug use: No     Allergies   Amoxicillin and Percocet [oxycodone-acetaminophen]   Review of Systems Review of Systems  Constitutional: Negative for chills and fever.  HENT: Negative for ear pain and sore throat.   Eyes: Negative for pain and visual disturbance.  Respiratory: Negative for cough and shortness of breath.  Cardiovascular: Negative for chest pain and palpitations.  Gastrointestinal: Negative for abdominal pain and vomiting.  Genitourinary: Negative for dysuria and hematuria.  Musculoskeletal: Negative for arthralgias and back pain.  Skin: Negative for color change and rash.  Neurological: Negative for seizures, syncope, weakness and numbness.  All other systems reviewed and are negative.    Physical Exam Triage Vital Signs ED Triage Vitals [11/11/19 1124]  Enc Vitals Group     BP      Pulse      Resp      Temp      Temp src      SpO2      Weight      Height      Head Circumference      Peak Flow      Pain Score 7     Pain Loc      Pain Edu?      Excl. in [place]?    No data found.  Updated Vital Signs BP 115/84 (BP Location: Left Arm)   Pulse 80   Temp 98.1 F (36.7 C) (Oral)   Resp 16   LMP 10/02/2019   SpO2 99%   Visual Acuity Right Eye Distance:   Left Eye Distance:   Bilateral  Distance:    Right Eye Near:   Left Eye Near:    Bilateral Near:     Physical Exam Vitals signs and nursing note reviewed.  Constitutional:      General: She is not in acute distress.    Appearance: She is well-developed.  HENT:     Head: Normocephalic and atraumatic.     Mouth/Throat:     Mouth: Mucous membranes are moist.     Pharynx: Oropharynx is clear.  Eyes:     Conjunctiva/sclera: Conjunctivae normal.  Neck:     Musculoskeletal: Neck supple.  Cardiovascular:     Rate and Rhythm: Normal rate and regular rhythm.     Heart sounds: No murmur.  Pulmonary:     Effort: Pulmonary effort is normal. No respiratory distress.     Breath sounds: Normal breath sounds.  Abdominal:     Palpations: Abdomen is soft.     Tenderness: There is no abdominal tenderness. There is no guarding or rebound.  Musculoskeletal: Normal range of motion.        General: No tenderness, deformity or signs of injury.     Right lower leg: Edema present.     Left lower leg: Edema present.     Comments: 1+ edema in bilateral LE, L>R.   Skin:    General: Skin is warm and dry.     Capillary Refill: Capillary refill takes less than 2 seconds.     Findings: No bruising, erythema, lesion or rash.  Neurological:     General: No focal deficit present.     Mental Status: She is alert and oriented to person, place, and time.     Sensory: No sensory deficit.     Motor: No weakness.     Gait: Gait normal.      UC Treatments / Results  Labs (all labs ordered are listed, but only abnormal results are displayed) Labs Reviewed - No data to display  EKG   Radiology No results found.  Procedures Procedures (including critical care time)  Medications Ordered in UC Medications - No data to display  Initial Impression / Assessment and Plan / UC Course  I have reviewed the triage vital signs and the  nursing notes.  Pertinent labs & imaging results that were available during my care of the patient were  reviewed by me and considered in my medical decision making (see chart for details).    Lymphedema. Instructed patient to use her compression stockings as directed and to put them on before getting out of bed in the morning. Instructed her to use her sequential compression pump twice a day as directed. Instructed her to rest and elevate her feet as she is able. Instructed her to follow a low-sodium diet; resource provided. Instructed her to call [facility] to schedule an appointment for follow-up. Patient agrees to plan of care.     Final Clinical Impressions(s) / UC Diagnoses   Final diagnoses:  Lymphedema     Discharge Instructions     Put your compression stockings on first thing in the morning before you get out of bed. Use the sequential compression pump twice a day as directed by your Vein and Vascular specialist. Rest and elevate your feet as you are able.  Follow a low-sodium diet. See the information attached.    Call the Vein and Vascular specialist to schedule an appointment for follow-up.        ED Prescriptions    None     PDMP not reviewed this encounter.   Mickie Bailate, Jael Waldorf H, NP 11/11/19 1153

## 2019-11-14 ENCOUNTER — Telehealth: Payer: Self-pay

## 2019-11-14 ENCOUNTER — Other Ambulatory Visit: Payer: Self-pay

## 2019-11-14 DIAGNOSIS — I890 Lymphedema, not elsewhere classified: Secondary | ICD-10-CM

## 2019-11-14 NOTE — Telephone Encounter (Signed)
Called to advise we have placed referral orders and if she can not wait she may need to see Urgent care. Reached out to [facility] who will try to get her in soon but she did inform that it takes a bit to get in with [facility]. Patient reports some swelling in L and R leg with L being worse. She is elevating and using compression stockings. Will go to [facility] if can not wait.

## 2019-11-29 ENCOUNTER — Other Ambulatory Visit: Payer: Self-pay

## 2019-11-29 ENCOUNTER — Ambulatory Visit (INDEPENDENT_AMBULATORY_CARE_PROVIDER_SITE_OTHER): Payer: 59 | Admitting: Vascular Surgery

## 2019-11-29 ENCOUNTER — Encounter (INDEPENDENT_AMBULATORY_CARE_PROVIDER_SITE_OTHER): Payer: Self-pay | Admitting: Vascular Surgery

## 2019-11-29 VITALS — BP 120/83 | HR 80 | Resp 16 | Wt 213.0 lb

## 2019-11-29 DIAGNOSIS — E6609 Other obesity due to excess calories: Secondary | ICD-10-CM | POA: Diagnosis not present

## 2019-11-29 DIAGNOSIS — I890 Lymphedema, not elsewhere classified: Secondary | ICD-10-CM | POA: Diagnosis not present

## 2019-11-29 DIAGNOSIS — Z6830 Body mass index (BMI) 30.0-30.9, adult: Secondary | ICD-10-CM | POA: Diagnosis not present

## 2019-11-29 DIAGNOSIS — M7989 Other specified soft tissue disorders: Secondary | ICD-10-CM | POA: Insufficient documentation

## 2019-11-29 NOTE — Assessment & Plan Note (Signed)
She has been previously diagnosed with lymphedema and has received appropriate therapy for this over several years.  Her symptoms are progressing despite appropriate therapy.  At this point, I am going to reassess her venous disease and we also raised the specter of potential may Thurner syndrome as a contributing factor to her left lower extremity swelling.  If her venous reflux study is unrevealing, we may want to consider a venogram for further evaluation. 

## 2019-11-29 NOTE — Assessment & Plan Note (Signed)
She has been previously diagnosed with lymphedema and has received appropriate therapy for this over several years.  Her symptoms are progressing despite appropriate therapy.  At this point, I am going to reassess her venous disease and we also raised the specter of potential may Thurner syndrome as a contributing factor to her left lower extremity swelling.  If her venous reflux study is unrevealing, we may want to consider a venogram for further evaluation.

## 2019-11-29 NOTE — Progress Notes (Signed)
Patient ID: Mallory Johnston, female   DOB: 1991-05-11, 28 y.o.   MRN: [id number]  Chief Complaint  Patient presents with  . New Patient (Initial Visit)    ref Army Melia for lymphedema    HPI Mallory Johnston is a 28 y.o. female.  I am asked to see the patient by Dr. Army Melia for evaluation of leg swelling and lymphedema predominately of the left leg.  Over the past couple of years, she is also started to notice some swelling in the right leg.  She was seen 5 to 6 years ago and actually has a lymphedema pump that she uses regularly.  She wears compression stockings daily.  Despite these maneuvers, her leg swelling seems to be slowly progressing.  She says that the swelling does not bother her nearly as much as the pain.  Her legs are very tired and heavy particularly after she works.  This has been a steadily progressive problem without a clear inciting event or causative factor.  No ulceration or infection.  No chest pain or shortness of breath.  No previous history of DVT or superficial thrombophlebitis to her knowledge.     Past Medical History:  Diagnosis Date  . H/O bilateral breast reduction surgery   . Lymphedema of left leg   . Ovarian cyst   . S/P tonsillectomy     Past Surgical History:  Procedure Laterality Date  . BREAST REDUCTION SURGERY    . OVARIAN CYST REMOVAL    . TONSILLECTOMY       Family History  Problem Relation Age of Onset  . Hypertension Father   . Diabetes Mother   . Breast cancer Neg Hx   . Ovarian cancer Neg Hx   . Colon cancer Neg Hx   No bleeding or clotting disorders  Social History   Tobacco Use  . Smoking status: Never Smoker  . Smokeless tobacco: Never Used  Substance Use Topics  . Alcohol use: Yes    Comment: rare  . Drug use: No    Allergies  Allergen Reactions  . Amoxicillin Rash  . Percocet [Oxycodone-Acetaminophen] Nausea And Vomiting and Rash    Current Outpatient Medications  Medication Sig Dispense Refill  . mirtazapine  (REMERON) 30 MG tablet Take 1 tablet (30 mg total) by mouth at bedtime. 30 tablet 0  . norethindrone-ethinyl estradiol (JUNEL FE 1/20) 1-20 MG-MCG tablet Take 1 tablet by mouth daily. 3 Package 3  . traZODone (DESYREL) 100 MG tablet TAKE 1 TABLET (100 MG TOTAL) BY MOUTH AT BEDTIME AS NEEDED FOR SLEEP. 30 tablet 0   No current facility-administered medications for this visit.       REVIEW OF SYSTEMS (Negative unless checked)  Constitutional: [] Weight loss  [] Fever  [] Chills Cardiac: [] Chest pain   [] Chest pressure   [] Palpitations   [] Shortness of breath when laying flat   [] Shortness of breath at rest   [] Shortness of breath with exertion. Vascular:  [x] Pain in legs with walking   [x] Pain in legs at rest   [] Pain in legs when laying flat   [] Claudication   [] Pain in feet when walking  [] Pain in feet at rest  [] Pain in feet when laying flat   [] History of DVT   [] Phlebitis   [x] Swelling in legs   [] Varicose veins   [] Non-healing ulcers Pulmonary:   [] Uses home oxygen   [] Productive cough   [] Hemoptysis   [] Wheeze  [] COPD   [] Asthma Neurologic:  [] Dizziness  [] Blackouts   [] Seizures   []   History of stroke   [] History of TIA  [] Aphasia   [] Temporary blindness   [] Dysphagia   [] Weakness or numbness in arms   [] Weakness or numbness in legs Musculoskeletal:  [] Arthritis   [] Joint swelling   [] Joint pain   [] Low back pain Hematologic:  [] Easy bruising  [] Easy bleeding   [] Hypercoagulable state   [] Anemic  [] Hepatitis Gastrointestinal:  [] Blood in stool   [] Vomiting blood  [] Gastroesophageal reflux/heartburn   [] Abdominal pain Genitourinary:  [] Chronic kidney disease   [] Difficult urination  [] Frequent urination  [] Burning with urination   [] Hematuria Skin:  [] Rashes   [] Ulcers   [] Wounds Psychological:  [x] History of anxiety   [x]  History of major depression.    Physical Exam BP 120/83 (BP Location: Right Arm)   Pulse 80   Resp 16   Wt 213 lb (96.6 kg)   BMI 40.25 kg/m  Gen:  WD/WN, NAD.  Obese  Head: [place]/AT, No temporalis wasting.  Ear/Nose/Throat: Hearing grossly intact, nares w/o erythema or drainage, oropharynx w/o Erythema/Exudate Eyes: Conjunctiva clear, sclera non-icteric  Neck: trachea midline.  No JVD.  Pulmonary:  Good air movement, respirations not labored, no use of accessory muscles  Cardiac: RRR, no JVD Vascular:  Vessel Right Left  Radial Palpable Palpable                          DP  2+  2+  PT  2+  1+   Gastrointestinal:. No masses, surgical incisions, or scars. Musculoskeletal: M/S 5/5 throughout.  Extremities without ischemic changes.  No deformity or atrophy.  Trace right lower extremity edema, 1+ left lower extremity edema. Neurologic: Sensation grossly intact in extremities.  Symmetrical.  Speech is fluent. Motor exam as listed above. Psychiatric: Judgment intact, Mood & affect appropriate for pt's clinical situation. Dermatologic: No rashes or ulcers noted.  No cellulitis or open wounds.    Radiology No results found.  Labs No results found for this or any previous visit (from the past 2160 hour(s)).  Assessment/Plan:  Swelling of limb She has been previously diagnosed with lymphedema and has received appropriate therapy for this over several years.  Her symptoms are progressing despite appropriate therapy.  At this point, I am going to reassess her venous disease and we also raised the specter of potential may Thurner syndrome as a contributing factor to her left lower extremity swelling.  If her venous reflux study is unrevealing, we may want to consider a venogram for further evaluation.  Class 1 obesity due to excess calories without serious comorbidity with body mass index (BMI) of 30.0 to 30.9 in adult Weight loss would be of benefit for improving her lower extremity pain and swelling.  Lymphedema She has been previously diagnosed with lymphedema and has received appropriate therapy for this over several years.  Her symptoms are  progressing despite appropriate therapy.  At this point, I am going to reassess her venous disease and we also raised the specter of potential may Thurner syndrome as a contributing factor to her left lower extremity swelling.  If her venous reflux study is unrevealing, we may want to consider a venogram for further evaluation.      11/29/2019, 4:17 PM   This note was created with Dragon medical transcription system.  Any errors from dictation are unintentional.

## 2019-11-29 NOTE — Patient Instructions (Signed)
Edema  Edema is when you have too much fluid in your body or under your skin. Edema may make your legs, feet, and ankles swell up. Swelling is also common in looser tissues, like around your eyes. This is a common condition. It gets more common as you get older. There are many possible causes of edema. Eating too much salt (sodium) and being on your feet or sitting for a long time can cause edema in your legs, feet, and ankles. Hot weather may make edema worse. Edema is usually painless. Your skin may look swollen or shiny. Follow these instructions at home:  Keep the swollen body part raised (elevated) above the level of your heart when you are sitting or lying down.  Do not sit still or stand for a long time.  Do not wear tight clothes. Do not wear garters on your upper legs.  Exercise your legs. This can help the swelling go down.  Wear elastic bandages or support stockings as told by your doctor.  Eat a low-salt (low-sodium) diet to reduce fluid as told by your doctor.  Depending on the cause of your swelling, you may need to limit how much fluid you drink (fluid restriction).  Take over-the-counter and prescription medicines only as told by your doctor. Contact a doctor if:  Treatment is not working.  You have heart, liver, or kidney disease and have symptoms of edema.  You have sudden and unexplained weight gain. Get help right away if:  You have shortness of breath or chest pain.  You cannot breathe when you lie down.  You have pain, redness, or warmth in the swollen areas.  You have heart, liver, or kidney disease and get edema all of a sudden.  You have a fever and your symptoms get worse all of a sudden. Summary  Edema is when you have too much fluid in your body or under your skin.  Edema may make your legs, feet, and ankles swell up. Swelling is also common in looser tissues, like around your eyes.  Raise (elevate) the swollen body part above the level of your  heart when you are sitting or lying down.  Follow your doctor's instructions about diet and how much fluid you can drink (fluid restriction). This information is not intended to replace advice given to you by your health care provider. Make sure you discuss any questions you have with your health care provider. Document Released: 06/02/2008 Document Revised: 12/18/2017 Document Reviewed: 01/02/2017 Elsevier Patient Education  2020 Elsevier Inc.  

## 2019-11-29 NOTE — Assessment & Plan Note (Signed)
Weight loss would be of benefit for improving her lower extremity pain and swelling.

## 2019-12-21 ENCOUNTER — Encounter (INDEPENDENT_AMBULATORY_CARE_PROVIDER_SITE_OTHER): Payer: 59

## 2019-12-21 ENCOUNTER — Ambulatory Visit (INDEPENDENT_AMBULATORY_CARE_PROVIDER_SITE_OTHER): Payer: 59 | Admitting: Nurse Practitioner

## 2019-12-25 ENCOUNTER — Encounter: Payer: Self-pay | Admitting: Internal Medicine

## 2019-12-26 ENCOUNTER — Encounter: Payer: Self-pay | Admitting: Internal Medicine

## 2019-12-26 ENCOUNTER — Ambulatory Visit (INDEPENDENT_AMBULATORY_CARE_PROVIDER_SITE_OTHER): Payer: 59 | Admitting: Internal Medicine

## 2019-12-26 VITALS — Ht 61.0 in | Wt 213.0 lb

## 2019-12-26 DIAGNOSIS — U071 COVID-19: Secondary | ICD-10-CM

## 2019-12-26 DIAGNOSIS — J1289 Other viral pneumonia: Secondary | ICD-10-CM

## 2019-12-26 DIAGNOSIS — J1282 Pneumonia due to coronavirus disease 2019: Secondary | ICD-10-CM

## 2019-12-26 MED ORDER — PREDNISONE 10 MG PO TABS: 10.0000 mg | ORAL_TABLET | ORAL | 0 refills | Status: AC

## 2019-12-26 MED ORDER — AZITHROMYCIN 250 MG PO TABS: ORAL_TABLET | ORAL | 0 refills | Status: AC

## 2019-12-26 NOTE — Progress Notes (Signed)
Spoke with and she is supposed to go back to work tomorrow. She is still having Covid symptoms. Her temp while talking to me on the phone is 100.2. She is still having the SOB as well.

## 2019-12-26 NOTE — Progress Notes (Signed)
Date:  12/26/2019   Name:  Mallory Johnston   DOB:  May 12, 1991   MRN:  [id number]  I connected with this patient, Mallory Johnston, by telephone at the patient's home.  I verified that I am speaking with the correct person using two identifiers. This visit was conducted via telephone due to the Covid-19 outbreak from my office at [facility] in [place], [place]. I discussed the limitations, risks, security and privacy concerns of performing an evaluation and management service by telephone. I also discussed with the patient that there may be a patient responsible charge related to this service. The patient expressed understanding and agreed to proceed.  Chief Complaint: No chief complaint on file. Tested for Covid on 12/17/2019 at [facility] - tested positive. She felt worse and was seen at ER on 12/21/2019.  She was prescribed tessalon perles. She has been at home, resting and now running a high fever that goes down with tylenol. She was supposed to return to work tomorrow but can not.  Cough This is a new problem. The current episode started 1 to 4 weeks ago. The problem has been unchanged. The problem occurs every few minutes. The cough is non-productive. Associated symptoms include chills, a fever and shortness of breath. Pertinent negatives include no chest pain, headaches, sore throat or wheezing.    Lab Results  Component Value Date   CREATININE 0.76 03/05/2019   BUN 8 03/05/2019   NA 139 03/05/2019   K 3.4 (L) 03/05/2019   CL 106 03/05/2019   CO2 25 03/05/2019   Lab Results  Component Value Date   CHOL 148 03/08/2019   HDL 52 03/08/2019   LDLCALC 73 03/08/2019   TRIG 115 03/08/2019   CHOLHDL 2.8 03/08/2019   Lab Results  Component Value Date   TSH 1.201 03/08/2019   Lab Results  Component Value Date   HGBA1C 5.3 03/08/2019     Review of Systems  Constitutional: Positive for appetite change, chills and fever.  HENT: Negative for sinus pressure, sore throat and trouble  swallowing.   Respiratory: Positive for cough, chest tightness and shortness of breath. Negative for wheezing.   Cardiovascular: Negative for chest pain.  Neurological: Positive for weakness. Negative for dizziness and headaches.    Patient Active Problem List   Diagnosis Date Noted  . Swelling of limb 11/29/2019  . Lymphedema 11/29/2019  . Severe recurrent major depression without psychotic features (HCC) 03/07/2019  . Suicidal ideation 03/06/2019  . Noncompliance 03/06/2019  . Class 1 obesity due to excess calories without serious comorbidity with body mass index (BMI) of 30.0 to 30.9 in adult 02/11/2017  . History of cervical dysplasia 02/11/2017  . Liver lesion, right lobe 10/17/2016  . Von Willebrand's disease (HCC) 11/10/2015  . PCOS (polycystic ovarian syndrome) 09/20/2015    Allergies  Allergen Reactions  . Amoxicillin Rash  . Percocet [Oxycodone-Acetaminophen] Nausea And Vomiting and Rash    Past Surgical History:  Procedure Laterality Date  . BREAST REDUCTION SURGERY    . OVARIAN CYST REMOVAL    . TONSILLECTOMY      Social History   Tobacco Use  . Smoking status: Never Smoker  . Smokeless tobacco: Never Used  Substance Use Topics  . Alcohol use: Yes    Comment: rare  . Drug use: No     Medication list has been reviewed and updated.  Current Meds  Medication Sig  . mirtazapine (REMERON) 30 MG tablet Take 1 tablet (30 mg  total) by mouth at bedtime.  . norethindrone-ethinyl estradiol (JUNEL FE 1/20) 1-20 MG-MCG tablet Take 1 tablet by mouth daily.  . traZODone (DESYREL) 100 MG tablet TAKE 1 TABLET (100 MG TOTAL) BY MOUTH AT BEDTIME AS NEEDED FOR SLEEP.    PHQ 2/9 Scores 04/08/2019 10/27/2017  PHQ - 2 Score 2 0  PHQ- 9 Score 13 -    BP Readings from Last 3 Encounters:  11/29/19 120/83  11/11/19 115/84  06/09/19 112/66    Physical Exam Pulmonary:     Comments: Mild dyspnea and frequent loose cough noted during the call. Neurological:     Mental  Status: She is alert.  Psychiatric:        Attention and Perception: Attention normal.        Mood and Affect: Mood normal.        Speech: Speech normal.        Cognition and Memory: Cognition normal.     Wt Readings from Last 3 Encounters:  12/26/19 213 lb (96.6 kg)  11/29/19 213 lb (96.6 kg)  06/09/19 206 lb 12.8 oz (93.8 kg)    Ht 5\' 1"  (1.549 m)   Wt 213 lb (96.6 kg)   LMP 12/26/2019   BMI 40.25 kg/m   Assessment and Plan: 1. Pneumonia due to COVID-19 virus Suspect pneumonia/bronchitis related to Covid-19 She is advised to increase fluids and include some glucose and electrolytes Letter for work is written for her to print from [employer] - out of work until at least Jan 4. Call or return to ER if worsening - azithromycin (ZITHROMAX Z-PAK) 250 MG tablet; UAD  Dispense: 6 each; Refill: 0 - predniSONE (DELTASONE) 10 MG tablet; Take 1 tablet (10 mg total) by mouth as directed for 6 days. Take 6,5,4,3,2,1 then stop  Dispense: 21 tablet; Refill: 0  I spent 10 minutes on this encounter. Partially dictated using Editor, commissioning. Any errors are unintentional.  Halina Maidens, MD [facility]  12/26/2019

## 2019-12-28 ENCOUNTER — Encounter: Payer: Self-pay | Admitting: Internal Medicine

## 2020-01-16 ENCOUNTER — Ambulatory Visit (INDEPENDENT_AMBULATORY_CARE_PROVIDER_SITE_OTHER): Payer: 59

## 2020-01-16 ENCOUNTER — Telehealth (INDEPENDENT_AMBULATORY_CARE_PROVIDER_SITE_OTHER): Payer: Self-pay

## 2020-01-16 ENCOUNTER — Other Ambulatory Visit: Payer: Self-pay

## 2020-01-16 ENCOUNTER — Encounter (INDEPENDENT_AMBULATORY_CARE_PROVIDER_SITE_OTHER): Payer: Self-pay | Admitting: Nurse Practitioner

## 2020-01-16 ENCOUNTER — Ambulatory Visit (INDEPENDENT_AMBULATORY_CARE_PROVIDER_SITE_OTHER): Payer: 59 | Admitting: Nurse Practitioner

## 2020-01-16 ENCOUNTER — Encounter (INDEPENDENT_AMBULATORY_CARE_PROVIDER_SITE_OTHER): Payer: Self-pay

## 2020-01-16 VITALS — BP 112/79 | HR 88 | Resp 12 | Ht 61.0 in | Wt 209.0 lb

## 2020-01-16 DIAGNOSIS — M7989 Other specified soft tissue disorders: Secondary | ICD-10-CM | POA: Diagnosis not present

## 2020-01-16 DIAGNOSIS — I890 Lymphedema, not elsewhere classified: Secondary | ICD-10-CM

## 2020-01-17 ENCOUNTER — Encounter (INDEPENDENT_AMBULATORY_CARE_PROVIDER_SITE_OTHER): Payer: Self-pay | Admitting: Nurse Practitioner

## 2020-01-17 NOTE — Progress Notes (Signed)
SUBJECTIVE:  Patient ID: Mallory Johnston, female    DOB: April 13, 1991, 29 y.o.   MRN: [id number] Chief Complaint  Patient presents with  . Follow-up    U/    HPI  Mallory Johnston is a 29 y.o. female the presents today for noninvasive studies.  The patient has had left leg swelling and lymphedema in the left lower extremity for several years however she is also started to notice some swelling of the right lower extremity.  The patient currently has a lymphedema pump that she utilizes regularly.  She also wears compression stockings on a daily basis.  She does have some difficulty with elevating just based on the nature of her work.  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  She notes that over the last several weeks she has been having more pain in her lower extremities.  She states that the left leg swells worse and the right leg aches due to the fact that when her left leg swells she puts more weight on it.  And then at some point they both began throbbing.  She denies fever, chills, nausea vomiting or diarrhea.  Today reflux study showed no evidence of DVT or superficial venous thrombosis bilaterally.  There is reflux bilaterally in the common femoral vein.  She also has reflux in the great saphenous vein at the saphenofemoral junction.  Past Medical History:  Diagnosis Date  . H/O bilateral breast reduction surgery   . Lymphedema of left leg   . Ovarian cyst   . S/P tonsillectomy     Past Surgical History:  Procedure Laterality Date  . BREAST REDUCTION SURGERY    . OVARIAN CYST REMOVAL    . TONSILLECTOMY      Social History   Socioeconomic History  . Marital status: Single    Spouse name: Not on file  . Number of children: Not on file  . Years of education: Not on file  . Highest education level: Not on file  Occupational History  . Not on file  Tobacco Use  . Smoking status: Never Smoker  . Smokeless tobacco: Never Used  Substance and Sexual  Activity  . Alcohol use: Yes    Comment: rare  . Drug use: No  . Sexual activity: Yes    Birth control/protection: Pill, Condom  Other Topics Concern  . Not on file  Social History Narrative  . Not on file   Social Determinants of Health   Financial Resource Strain:   . Difficulty of Paying Living Expenses: Not on file  Food Insecurity:   . Worried About Programme researcher, broadcasting/film/video in the Last Year: Not on file  . Ran Out of Food in the Last Year: Not on file  Transportation Needs:   . Lack of Transportation (Medical): Not on file  . Lack of Transportation (Non-Medical): Not on file  Physical Activity:   . Days of Exercise per Week: Not on file  . Minutes of Exercise per Session: Not on file  Stress:   . Feeling of Stress : Not on file  Social Connections:   . Frequency of Communication with Friends and Family: Not on file  . Frequency of Social Gatherings with Friends and Family: Not on file  . Attends Religious Services: Not on file  . Active Member of Clubs or Organizations: Not on file  . Attends Banker Meetings: Not on file  . Marital Status: Not on file  Intimate Partner Violence:   . Fear of Current or Ex-Partner: Not on file  . Emotionally Abused: Not on file  . Physically Abused: Not on file  . Sexually Abused: Not on file    Family History  Problem Relation Age of Onset  . Hypertension Father   . Diabetes Mother   . Breast cancer Neg Hx   . Ovarian cancer Neg Hx   . Colon cancer Neg Hx     Allergies  Allergen Reactions  . Amoxicillin Rash  . Percocet [Oxycodone-Acetaminophen] Nausea And Vomiting and Rash     Review of Systems   Review of Systems: Negative Unless Checked Constitutional: [] Weight loss  [] Fever  [] Chills Cardiac: [] Chest pain   []  Atrial Fibrillation  [] Palpitations   [] Shortness of breath when laying flat   [] Shortness of breath with exertion. [] Shortness of breath at rest Vascular:  [] Pain in legs with walking   [] Pain in  legs with standing [] Pain in legs when laying flat   [] Claudication    [] Pain in feet when laying flat    [] History of DVT   [] Phlebitis   [x] Swelling in legs   [] Varicose veins   [] Non-healing ulcers Pulmonary:   [] Uses home oxygen   [] Productive cough   [] Hemoptysis   [] Wheeze  [] COPD   [] Asthma Neurologic:  [] Dizziness   [] Seizures  [] Blackouts [] History of stroke   [] History of TIA  [] Aphasia   [] Temporary Blindness   [] Weakness or numbness in arm   [] Weakness or numbness in leg Musculoskeletal:   [] Joint swelling   [] Joint pain   [] Low back pain  []  History of Knee Replacement [] Arthritis [] back Surgeries  []  Spinal Stenosis    Hematologic:  [] Easy bruising  [] Easy bleeding   [] Hypercoagulable state   [] Anemic Gastrointestinal:  [] Diarrhea   [] Vomiting  [] Gastroesophageal reflux/heartburn   [] Difficulty swallowing. [] Abdominal pain Genitourinary:  [] Chronic kidney disease   [] Difficult urination  [] Anuric   [] Blood in urine [] Frequent urination  [] Burning with urination   [] Hematuria Skin:  [] Rashes   [] Ulcers [] Wounds Psychological:  [] History of anxiety   [x]  History of major depression  []  Memory Difficulties      OBJECTIVE:   Physical Exam  BP 112/79 (BP Location: Right Arm)   Pulse 88   Resp 12   Ht 5\' 1"  (1.549 m)   Wt 209 lb (94.8 kg)   LMP 12/26/2019   BMI 39.49 kg/m   Gen: WD/WN, NAD Head: [place]/AT, No temporalis wasting.  Ear/Nose/Throat: Hearing grossly intact, nares w/o erythema or drainage Eyes: PER, EOMI, sclera nonicteric.  Neck: Supple, no masses.  No JVD.  Pulmonary:  Good air movement, no use of accessory muscles.  Cardiac: RRR Vascular:  2+ edema bilaterally  Gastrointestinal: soft, non-distended. No guarding/no peritoneal signs.  Musculoskeletal: M/S 5/5 throughout.  No deformity or atrophy.  Neurologic: Pain and light touch intact in extremities.  Symmetrical.  Speech is fluent. Motor exam as listed above. Psychiatric: Judgment intact, Mood & affect  appropriate for pt's clinical situation. Dermatologic: No Venous rashes. No Ulcers Noted.  No changes consistent with cellulitis. Lymph : No Cervical lymphadenopathy, no lichenification or skin changes of chronic lymphedema.       ASSESSMENT AND PLAN:  1. Lymphedema Currently the patient has good adherence to standard conservative therapy measures such as wearing medical grade 1 compression stockings in addition to utilization of her lymph pump.  Due to her occupation however sometimes it is difficult for her to elevate on a regular  basis.  Based on her noninvasive studies, in order to try to help the patient somewhat we will try her in Unna wraps for 2 weeks to see if this makes a difference in the swelling and pain that she currently has.  If it is found that the patient feels better after Unna wraps will continue for several weeks to see if that continues to help the patient.  However, if the Unna wraps proved to be unhelpful we will plan to do a venogram to evaluate for possible may Thurner syndrome.   Current Outpatient Medications on File Prior to Visit  Medication Sig Dispense Refill  . mirtazapine (REMERON) 30 MG tablet Take 1 tablet (30 mg total) by mouth at bedtime. 30 tablet 0  . norethindrone-ethinyl estradiol (JUNEL FE 1/20) 1-20 MG-MCG tablet Take 1 tablet by mouth daily. 3 Package 3  . traZODone (DESYREL) 100 MG tablet TAKE 1 TABLET (100 MG TOTAL) BY MOUTH AT BEDTIME AS NEEDED FOR SLEEP. 30 tablet 0   No current facility-administered medications on file prior to visit.    There are no Patient Instructions on file for this visit. No follow-ups on file.   Georgiana Spinner, NP  This note was completed with Office manager.  Any errors are purely unintentional.

## 2020-01-19 NOTE — Telephone Encounter (Signed)
Void  

## 2020-01-23 ENCOUNTER — Ambulatory Visit (INDEPENDENT_AMBULATORY_CARE_PROVIDER_SITE_OTHER): Payer: 59 | Admitting: Nurse Practitioner

## 2020-01-23 ENCOUNTER — Other Ambulatory Visit: Payer: Self-pay

## 2020-01-23 ENCOUNTER — Encounter (INDEPENDENT_AMBULATORY_CARE_PROVIDER_SITE_OTHER): Payer: Self-pay

## 2020-01-23 VITALS — BP 115/77 | HR 98 | Resp 16 | Wt 211.0 lb

## 2020-01-23 DIAGNOSIS — I890 Lymphedema, not elsewhere classified: Secondary | ICD-10-CM

## 2020-01-23 NOTE — Progress Notes (Signed)
History of Present Illness  There is no documented history at this time  Assessments & Plan   There are no diagnoses linked to this encounter.    Additional instructions  Subjective:  Patient presents with venous ulcer of the Bilateral lower extremity.    Procedure:  3 layer unna wrap was placed Bilateral lower extremity.   Plan:   Follow up in one week.  

## 2020-01-24 ENCOUNTER — Encounter (INDEPENDENT_AMBULATORY_CARE_PROVIDER_SITE_OTHER): Payer: Self-pay | Admitting: Nurse Practitioner

## 2020-01-30 ENCOUNTER — Encounter (INDEPENDENT_AMBULATORY_CARE_PROVIDER_SITE_OTHER): Payer: 59

## 2020-01-31 ENCOUNTER — Other Ambulatory Visit: Payer: Self-pay

## 2020-01-31 ENCOUNTER — Ambulatory Visit (INDEPENDENT_AMBULATORY_CARE_PROVIDER_SITE_OTHER): Payer: 59 | Admitting: Nurse Practitioner

## 2020-01-31 ENCOUNTER — Encounter (INDEPENDENT_AMBULATORY_CARE_PROVIDER_SITE_OTHER): Payer: Self-pay

## 2020-01-31 VITALS — BP 117/83 | HR 84 | Resp 16 | Wt 202.0 lb

## 2020-01-31 DIAGNOSIS — I890 Lymphedema, not elsewhere classified: Secondary | ICD-10-CM | POA: Diagnosis not present

## 2020-01-31 NOTE — Progress Notes (Signed)
History of Present Illness  There is no documented history at this time  Assessments & Plan   There are no diagnoses linked to this encounter.    Additional instructions  Subjective:  Patient presents with venous ulcer of the Bilateral lower extremity.    Procedure:  3 layer unna wrap was placed Bilateral lower extremity.   Plan:   Follow up in one week.  

## 2020-02-06 ENCOUNTER — Ambulatory Visit (INDEPENDENT_AMBULATORY_CARE_PROVIDER_SITE_OTHER): Payer: 59 | Admitting: Nurse Practitioner

## 2020-02-06 ENCOUNTER — Other Ambulatory Visit: Payer: Self-pay

## 2020-02-06 ENCOUNTER — Encounter (INDEPENDENT_AMBULATORY_CARE_PROVIDER_SITE_OTHER): Payer: Self-pay | Admitting: Nurse Practitioner

## 2020-02-06 VITALS — BP 126/85 | HR 99 | Resp 12 | Ht 61.0 in | Wt 206.0 lb

## 2020-02-06 DIAGNOSIS — I890 Lymphedema, not elsewhere classified: Secondary | ICD-10-CM | POA: Diagnosis not present

## 2020-02-07 ENCOUNTER — Encounter (INDEPENDENT_AMBULATORY_CARE_PROVIDER_SITE_OTHER): Payer: Self-pay | Admitting: Nurse Practitioner

## 2020-02-07 NOTE — Progress Notes (Signed)
SUBJECTIVE:  Patient ID: Mcarthur Rossetti, female    DOB: January 12, 1991, 29 y.o.   MRN: [id number] Chief Complaint  Patient presents with  . Follow-up    BLE Unna check    HPI  DANAYA GEDDIS is a 29 y.o. female presents today for an Unna wrap check.  The patient has had severe left leg swelling and lymphedema in the left lower extremity for several years but has recently had swelling in the right lower extremity recently.  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  The patient has good adherence to conservative therapy including wearing compression stockings, utilizing the lymphedema pump and daily exercise.  Elevation has helped somewhat with a provided work note.  The previous pain that she was having in her lower extremities is better however there still is some persistent swelling in the left foot.  Patient denies any fever, chills, nausea, vomiting or diarrhea.  The patient has tolerated the Unna wraps well.  Past Medical History:  Diagnosis Date  . H/O bilateral breast reduction surgery   . Lymphedema of left leg   . Ovarian cyst   . S/P tonsillectomy     Past Surgical History:  Procedure Laterality Date  . BREAST REDUCTION SURGERY    . OVARIAN CYST REMOVAL    . TONSILLECTOMY      Social History   Socioeconomic History  . Marital status: Single    Spouse name: Not on file  . Number of children: Not on file  . Years of education: Not on file  . Highest education level: Not on file  Occupational History  . Not on file  Tobacco Use  . Smoking status: Never Smoker  . Smokeless tobacco: Never Used  Substance and Sexual Activity  . Alcohol use: Yes    Comment: rare  . Drug use: No  . Sexual activity: Yes    Birth control/protection: Pill, Condom  Other Topics Concern  . Not on file  Social History Narrative  . Not on file   Social Determinants of Health   Financial Resource Strain:     . Difficulty of Paying Living Expenses: Not on file  Food Insecurity:   . Worried About Programme researcher, broadcasting/film/video in the Last Year: Not on file  . Ran Out of Food in the Last Year: Not on file  Transportation Needs:   . Lack of Transportation (Medical): Not on file  . Lack of Transportation (Non-Medical): Not on file  Physical Activity:   . Days of Exercise per Week: Not on file  . Minutes of Exercise per Session: Not on file  Stress:   . Feeling of Stress : Not on file  Social Connections:   . Frequency of Communication with Friends and Family: Not on file  . Frequency of Social Gatherings with Friends and Family: Not on file  . Attends Religious Services: Not on file  . Active Member of Clubs or Organizations: Not on file  . Attends Banker Meetings: Not on file  . Marital Status: Not on file  Intimate Partner Violence:   . Fear of Current or Ex-Partner: Not on file  . Emotionally Abused: Not on file  . Physically Abused: Not on file  . Sexually Abused: Not on file    Family History  Problem Relation Age of Onset  . Hypertension Father   . Diabetes Mother   .  Breast cancer Neg Hx   . Ovarian cancer Neg Hx   . Colon cancer Neg Hx     Allergies  Allergen Reactions  . Amoxicillin Rash  . Percocet [Oxycodone-Acetaminophen] Nausea And Vomiting and Rash     Review of Systems   Review of Systems: Negative Unless Checked Constitutional: [] Weight loss  [] Fever  [] Chills Cardiac: [] Chest pain   []  Atrial Fibrillation  [] Palpitations   [] Shortness of breath when laying flat   [] Shortness of breath with exertion. [] Shortness of breath at rest Vascular:  [] Pain in legs with walking   [] Pain in legs with standing [] Pain in legs when laying flat   [] Claudication    [] Pain in feet when laying flat    [] History of DVT   [] Phlebitis   [x] Swelling in legs   [] Varicose veins   [] Non-healing ulcers Pulmonary:   [] Uses home oxygen   [] Productive cough   [] Hemoptysis   [] Wheeze   [] COPD   [] Asthma Neurologic:  [] Dizziness   [] Seizures  [] Blackouts [] History of stroke   [] History of TIA  [] Aphasia   [] Temporary Blindness   [] Weakness or numbness in arm   [] Weakness or numbness in leg Musculoskeletal:   [] Joint swelling   [] Joint pain   [] Low back pain  []  History of Knee Replacement [] Arthritis [] back Surgeries  []  Spinal Stenosis    Hematologic:  [] Easy bruising  [] Easy bleeding   [] Hypercoagulable state   [] Anemic Gastrointestinal:  [] Diarrhea   [] Vomiting  [] Gastroesophageal reflux/heartburn   [] Difficulty swallowing. [] Abdominal pain Genitourinary:  [] Chronic kidney disease   [] Difficult urination  [] Anuric   [] Blood in urine [] Frequent urination  [] Burning with urination   [] Hematuria Skin:  [] Rashes   [] Ulcers [] Wounds Psychological:  [x] History of anxiety   [x]  History of major depression  []  Memory Difficulties      OBJECTIVE:   Physical Exam  BP 126/85 (BP Location: Right Arm)   Pulse 99   Resp 12   Ht 5\' 1"  (1.549 m)   Wt 206 lb (93.4 kg)   BMI 38.92 kg/m   Gen: WD/WN, NAD Head: [place]/AT, No temporalis wasting.  Ear/Nose/Throat: Hearing grossly intact, nares w/o erythema or drainage Eyes: PER, EOMI, sclera nonicteric.  Neck: Supple, no masses.  No JVD.  Pulmonary:  Good air movement, no use of accessory muscles.  Cardiac: RRR Vascular:  1+ edema bilaterally, 3+ edema on left foot Vessel Right Left  Radial Palpable Palpable  Dorsalis Pedis Palpable Palpable  Posterior Tibial Palpable Palpable   Gastrointestinal: soft, non-distended. No guarding/no peritoneal signs.  Musculoskeletal: M/S 5/5 throughout.  No deformity or atrophy.  Neurologic: Pain and light touch intact in extremities.  Symmetrical.  Speech is fluent. Motor exam as listed above. Psychiatric: Judgment intact, Mood & affect appropriate for pt's clinical situation. Dermatologic: No Venous rashes. No Ulcers Noted.  No changes consistent with cellulitis. Lymph : No Cervical  lymphadenopathy, no lichenification or early dermal thickening       ASSESSMENT AND PLAN:  1. Lymphedema At this time the Unna wraps have helped patient a good deal with pain and discomfort.  At this time we will have the patient remain in Unna wraps.  She will present to our office for changes once a week with reevaluation in 4 weeks.  In addition we will refer to the patient to the lymphedema clinic in order to determine what additional treatments and/or tactics may be available to try to help her manage her lymphedema better.  Patient is agreeable to  this plan.  Previously, we discussed with the patient undergoing a venogram in order to determine if there was a possible component of May Thurner syndrome contributing to her lower extremity edema.  In this instance we will have the patient go to the lymphedema clinic to see what tactics may be useful, as well as to see if this is helpful.  If this also does not provide to be helpful for the patient in controlling her edema, we will again continue to consider proceeding with venogram. - Ambulatory referral to Occupational Therapy   Current Outpatient Medications on File Prior to Visit  Medication Sig Dispense Refill  . mirtazapine (REMERON) 30 MG tablet Take 1 tablet (30 mg total) by mouth at bedtime. 30 tablet 0  . norethindrone-ethinyl estradiol (JUNEL FE 1/20) 1-20 MG-MCG tablet Take 1 tablet by mouth daily. 3 Package 3  . traZODone (DESYREL) 100 MG tablet TAKE 1 TABLET (100 MG TOTAL) BY MOUTH AT BEDTIME AS NEEDED FOR SLEEP. 30 tablet 0   No current facility-administered medications on file prior to visit.    There are no Patient Instructions on file for this visit. No follow-ups on file.   Kris Hartmann, NP  This note was completed with Sales executive.  Any errors are purely unintentional.

## 2020-02-14 ENCOUNTER — Ambulatory Visit (INDEPENDENT_AMBULATORY_CARE_PROVIDER_SITE_OTHER): Payer: 59 | Admitting: Nurse Practitioner

## 2020-02-14 ENCOUNTER — Other Ambulatory Visit: Payer: Self-pay

## 2020-02-14 ENCOUNTER — Encounter (INDEPENDENT_AMBULATORY_CARE_PROVIDER_SITE_OTHER): Payer: Self-pay | Admitting: Nurse Practitioner

## 2020-02-14 VITALS — BP 120/79 | HR 91 | Resp 12 | Ht 61.0 in | Wt 207.0 lb

## 2020-02-14 DIAGNOSIS — I890 Lymphedema, not elsewhere classified: Secondary | ICD-10-CM

## 2020-02-14 NOTE — Progress Notes (Signed)
History of Present Illness  There is no documented history at this time  Assessments & Plan   There are no diagnoses linked to this encounter.    Additional instructions  Subjective:  Patient presents with venous ulcer of the Bilateral lower extremity.    Procedure:  3 layer unna wrap was placed Bilateral lower extremity.   Plan:   Follow up in one week.  

## 2020-02-21 ENCOUNTER — Encounter (INDEPENDENT_AMBULATORY_CARE_PROVIDER_SITE_OTHER): Payer: 59

## 2020-02-22 ENCOUNTER — Encounter (INDEPENDENT_AMBULATORY_CARE_PROVIDER_SITE_OTHER): Payer: Self-pay | Admitting: Nurse Practitioner

## 2020-02-22 ENCOUNTER — Ambulatory Visit (INDEPENDENT_AMBULATORY_CARE_PROVIDER_SITE_OTHER): Payer: 59 | Admitting: Nurse Practitioner

## 2020-02-22 ENCOUNTER — Other Ambulatory Visit: Payer: Self-pay

## 2020-02-22 VITALS — BP 130/86 | HR 91 | Resp 12 | Ht 61.0 in | Wt 207.0 lb

## 2020-02-22 DIAGNOSIS — I890 Lymphedema, not elsewhere classified: Secondary | ICD-10-CM | POA: Diagnosis not present

## 2020-02-22 NOTE — Progress Notes (Signed)
History of Present Illness  There is no documented history at this time  Assessments & Plan   There are no diagnoses linked to this encounter.    Additional instructions  Subjective:  Patient presents with venous ulcer of the Bilateral lower extremity.    Procedure:  3 layer unna wrap was placed Bilateral lower extremity.   Plan:   Follow up in one week.  

## 2020-02-27 ENCOUNTER — Ambulatory Visit (INDEPENDENT_AMBULATORY_CARE_PROVIDER_SITE_OTHER): Payer: 59 | Admitting: Nurse Practitioner

## 2020-02-27 ENCOUNTER — Other Ambulatory Visit: Payer: Self-pay

## 2020-02-27 ENCOUNTER — Encounter (INDEPENDENT_AMBULATORY_CARE_PROVIDER_SITE_OTHER): Payer: Self-pay | Admitting: Nurse Practitioner

## 2020-02-27 VITALS — BP 127/83 | HR 87 | Resp 12 | Ht 61.0 in | Wt 208.0 lb

## 2020-02-27 DIAGNOSIS — I890 Lymphedema, not elsewhere classified: Secondary | ICD-10-CM

## 2020-02-27 NOTE — Progress Notes (Signed)
History of Present Illness  There is no documented history at this time  Assessments & Plan   There are no diagnoses linked to this encounter.    Additional instructions  Subjective:  Patient presents with venous ulcer of the Bilateral lower extremity.    Procedure:  3 layer unna wrap was placed Bilateral lower extremity.   Plan:   Follow up in one week.  Pt is having left Ankle pain.

## 2020-02-28 ENCOUNTER — Encounter (INDEPENDENT_AMBULATORY_CARE_PROVIDER_SITE_OTHER): Payer: 59

## 2020-03-02 ENCOUNTER — Ambulatory Visit: Payer: Self-pay | Admitting: Occupational Therapy

## 2020-03-06 ENCOUNTER — Other Ambulatory Visit: Payer: Self-pay

## 2020-03-06 ENCOUNTER — Encounter (INDEPENDENT_AMBULATORY_CARE_PROVIDER_SITE_OTHER): Payer: Self-pay | Admitting: Nurse Practitioner

## 2020-03-06 ENCOUNTER — Ambulatory Visit (INDEPENDENT_AMBULATORY_CARE_PROVIDER_SITE_OTHER): Payer: 59 | Admitting: Nurse Practitioner

## 2020-03-06 VITALS — BP 112/78 | HR 96 | Resp 10 | Ht 61.0 in | Wt 213.0 lb

## 2020-03-06 DIAGNOSIS — I890 Lymphedema, not elsewhere classified: Secondary | ICD-10-CM

## 2020-03-06 NOTE — Progress Notes (Signed)
SUBJECTIVE:  Patient ID: Mallory Johnston, female    DOB: 16-Aug-1991, 29 y.o.   MRN: [id number] Chief Complaint  Patient presents with  . Follow-up    unna wrap check     HPI  Mallory Johnston is a 29 y.o. female that presents today for follow-up evaluation of lower extremity edema.  The patient has been in wraps for the last several weeks.  Today her lower extremity edema looks much better than it has in the last few weeks.  The patient also transitioned from wearing following slippers to sneakers and that has helped with the pedal edema.  The patient did have an episode yesterday where she had some severe pain at the ankle area.  This happened at the end of the day.  Today the patient does not have any evidence of cellulitis or infection in the bilateral lower extremities.  No open ulcerations.  No fever or chills.  Patient does have medical grade 1 compression stockings as well.  She will be following up with the lymphedema clinic sometime in April as well.  Past Medical History:  Diagnosis Date  . H/O bilateral breast reduction surgery   . Lymphedema of left leg   . Ovarian cyst   . S/P tonsillectomy     Past Surgical History:  Procedure Laterality Date  . BREAST REDUCTION SURGERY    . OVARIAN CYST REMOVAL    . TONSILLECTOMY      Social History   Socioeconomic History  . Marital status: Single    Spouse name: Not on file  . Number of children: Not on file  . Years of education: Not on file  . Highest education level: Not on file  Occupational History  . Not on file  Tobacco Use  . Smoking status: Never Smoker  . Smokeless tobacco: Never Used  Substance and Sexual Activity  . Alcohol use: Yes    Comment: rare  . Drug use: No  . Sexual activity: Yes    Birth control/protection: Pill, Condom  Other Topics Concern  . Not on file  Social History Narrative  . Not on file   Social Determinants of Health   Financial Resource Strain:   . Difficulty of Paying Living  Expenses: Not on file  Food Insecurity:   . Worried About Charity fundraiser in the Last Year: Not on file  . Ran Out of Food in the Last Year: Not on file  Transportation Needs:   . Lack of Transportation (Medical): Not on file  . Lack of Transportation (Non-Medical): Not on file  Physical Activity:   . Days of Exercise per Week: Not on file  . Minutes of Exercise per Session: Not on file  Stress:   . Feeling of Stress : Not on file  Social Connections:   . Frequency of Communication with Friends and Family: Not on file  . Frequency of Social Gatherings with Friends and Family: Not on file  . Attends Religious Services: Not on file  . Active Member of Clubs or Organizations: Not on file  . Attends Archivist Meetings: Not on file  . Marital Status: Not on file  Intimate Partner Violence:   . Fear of Current or Ex-Partner: Not on file  . Emotionally Abused: Not on file  . Physically Abused: Not on file  . Sexually Abused: Not on file    Family History  Problem Relation Age of Onset  . Hypertension Father   . Diabetes  Mother   . Breast cancer Neg Hx   . Ovarian cancer Neg Hx   . Colon cancer Neg Hx     Allergies  Allergen Reactions  . Amoxicillin Rash  . Percocet [Oxycodone-Acetaminophen] Nausea And Vomiting and Rash     Review of Systems   Review of Systems: Negative Unless Checked Constitutional: [] Weight loss  [] Fever  [] Chills Cardiac: [] Chest pain   []  Atrial Fibrillation  [] Palpitations   [] Shortness of breath when laying flat   [] Shortness of breath with exertion. [] Shortness of breath at rest Vascular:  [] Pain in legs with walking   [] Pain in legs with standing [] Pain in legs when laying flat   [] Claudication    [] Pain in feet when laying flat    [] History of DVT   [] Phlebitis   [x] Swelling in legs   [] Varicose veins   [] Non-healing ulcers Pulmonary:   [] Uses home oxygen   [] Productive cough   [] Hemoptysis   [] Wheeze  [] COPD   [] Asthma Neurologic:   [] Dizziness   [] Seizures  [] Blackouts [] History of stroke   [] History of TIA  [] Aphasia   [] Temporary Blindness   [] Weakness or numbness in arm   [] Weakness or numbness in leg Musculoskeletal:   [] Joint swelling   [] Joint pain   [] Low back pain  []  History of Knee Replacement [] Arthritis [] back Surgeries  []  Spinal Stenosis    Hematologic:  [] Easy bruising  [] Easy bleeding   [] Hypercoagulable state   [] Anemic Gastrointestinal:  [] Diarrhea   [] Vomiting  [] Gastroesophageal reflux/heartburn   [] Difficulty swallowing. [] Abdominal pain Genitourinary:  [] Chronic kidney disease   [] Difficult urination  [] Anuric   [] Blood in urine [] Frequent urination  [] Burning with urination   [] Hematuria Skin:  [] Rashes   [] Ulcers [] Wounds Psychological:  [] History of anxiety   []  History of major depression  []  Memory Difficulties      OBJECTIVE:   Physical Exam  BP 112/78   Pulse 96   Resp 10   Ht 5\' 1"  (1.549 m)   Wt 213 lb (96.6 kg)   BMI 40.25 kg/m   Gen: WD/WN, NAD Head: [place]/AT, No temporalis wasting.  Ear/Nose/Throat: Hearing grossly intact, nares w/o erythema or drainage Eyes: PER, EOMI, sclera nonicteric.  Neck: Supple, no masses.  No JVD.  Pulmonary:  Good air movement, no use of accessory muscles.  Cardiac: RRR Vascular: 1 + edema bilaterally Vessel Right Left  Radial Palpable Palpable  Dorsalis Pedis Palpable Palpable  Posterior Tibial Palpable Palpable   Gastrointestinal: soft, non-distended. No guarding/no peritoneal signs.  Musculoskeletal: M/S 5/5 throughout.  No deformity or atrophy.  Neurologic: Pain and light touch intact in extremities.  Symmetrical.  Speech is fluent. Motor exam as listed above. Psychiatric: Judgment intact, Mood & affect appropriate for pt's clinical situation. Dermatologic: No Venous rashes. No Ulcers Noted.  No changes consistent with cellulitis. Lymph : No Cervical lymphadenopathy, no lichenification or skin changes of chronic lymphedema.        ASSESSMENT AND PLAN:  1. Lymphedema Patient's lower extremity edema is much better controlled than the last previous visit.  The patient has also switched from wearing ballerina flat shoes to sneakers and this also appears to have helped her swelling too.  It is likely that the ankle irritation is due to being in the wraps for the last 12 weeks or so.  Advised the patient to continue wearing her medical grade 1 compression stockings on a daily basis as well as to elevate her lower extremities as much as possible.  Patient is also advised that exercise is strongly encouraged even if she is walking.  The patient will be starting with the lymphedema clinic in about a month or so.  We will have the patient follow-up in 6 months to reevaluate her lower extremity edema.  Patient is advised to contact her office to follow-up sooner if needed.   Current Outpatient Medications on File Prior to Visit  Medication Sig Dispense Refill  . mirtazapine (REMERON) 30 MG tablet Take 1 tablet (30 mg total) by mouth at bedtime. 30 tablet 0  . norethindrone-ethinyl estradiol (JUNEL FE 1/20) 1-20 MG-MCG tablet Take 1 tablet by mouth daily. 3 Package 3  . traZODone (DESYREL) 100 MG tablet TAKE 1 TABLET (100 MG TOTAL) BY MOUTH AT BEDTIME AS NEEDED FOR SLEEP. 30 tablet 0   No current facility-administered medications on file prior to visit.    There are no Patient Instructions on file for this visit. No follow-ups on file.   Georgiana Spinner, NP  This note was completed with Office manager.  Any errors are purely unintentional.

## 2020-03-12 ENCOUNTER — Encounter (INDEPENDENT_AMBULATORY_CARE_PROVIDER_SITE_OTHER): Payer: 59

## 2020-03-22 ENCOUNTER — Ambulatory Visit: Payer: Self-pay

## 2020-03-26 ENCOUNTER — Ambulatory Visit: Payer: Self-pay | Attending: Internal Medicine

## 2020-03-26 DIAGNOSIS — Z23 Encounter for immunization: Secondary | ICD-10-CM

## 2020-03-26 NOTE — Progress Notes (Signed)
   Covid-19 Vaccination Clinic  Name:  Mallory Johnston    MRN: [id number] DOB: 03/22/1991  03/26/2020  [name] was observed post Covid-19 immunization for 15 minutes without incident. She was provided with Vaccine Information Sheet and instruction to access the V-Safe system.   [name] was instructed to call 911 with any severe reactions post vaccine: Marland Kitchen Difficulty breathing  . Swelling of face and throat  . A fast heartbeat  . A bad rash all over body  . Dizziness and weakness   Immunizations Administered    Name Date Dose VIS Date Route   Pfizer COVID-19 Vaccine 03/26/2020  9:22 AM 0.3 mL 12/09/2019 Intramuscular   Manufacturer: [employer], [employer]   Lot: [id number]   NDC: [address]-6

## 2020-04-02 ENCOUNTER — Ambulatory Visit: Payer: BC Managed Care – PPO | Attending: Nurse Practitioner | Admitting: Occupational Therapy

## 2020-04-02 ENCOUNTER — Other Ambulatory Visit: Payer: Self-pay

## 2020-04-02 ENCOUNTER — Encounter: Payer: Self-pay | Admitting: Occupational Therapy

## 2020-04-02 DIAGNOSIS — I890 Lymphedema, not elsewhere classified: Secondary | ICD-10-CM | POA: Insufficient documentation

## 2020-04-02 NOTE — Patient Instructions (Signed)

## 2020-04-03 NOTE — Therapy (Signed)
[facility] MAIN [facility] SERVICES [address] [place], [place], [address] Phone: [phone]   Fax:  [phone]  Occupational Therapy Evaluation  Patient Details  Name: Mallory Johnston MRN: [id number] Date of Birth: 1991-02-15 Referring Provider (OT): Sheppard Plumber, NP   Encounter Date: 04/02/2020  OT End of Session - 04/03/20 1325    Visit Number  1    Number of Visits  36    Date for OT Re-Evaluation  07/01/20    OT Start Time  0814    OT Stop Time  0900    OT Time Calculation (min)  46 min    Activity Tolerance  Patient tolerated treatment well;No increased pain    Behavior During Therapy  WFL for tasks assessed/performed       Past Medical History:  Diagnosis Date  . H/O bilateral breast reduction surgery   . Lymphedema of left leg   . Ovarian cyst   . S/P tonsillectomy     Past Surgical History:  Procedure Laterality Date  . BREAST REDUCTION SURGERY    . OVARIAN CYST REMOVAL    . TONSILLECTOMY      There were no vitals filed for this visit.  Subjective Assessment - 04/03/20 1238    Subjective   Mallory Johnston is a 29 y o female referred to Occupational Therapy by Sheppard Plumber, NP, for evaluation and treatment of BLE lymphedema, L>R. Pt reports onset of left leg swelling concurrent with an  episode of cellulitis about 5 years ago when she was 29 years old. Pt reports LLE swelling has worsened and no longer resolves, while RLE has recently started swelling distally. Pt reports ultrasound ruled out DVT a and she told she has poor circulation in the veins in her groin.  Pt recalls that her paternal greandmother had leg swelling. Pt wears off-the-shelf knee length compression socks most days, but reports they are "no help"and provide"no releif". Pthad not undergon formal lymphedema care with Complete Decongestive Therapy (CDT), but she has a "lymphedema pump" that she uses on bilateral legs simultaneously for 1 hour, twice a day. Pt'sgoals  for therapy are to reduce leg swelling and pain so she can work out, improve her balance, resume dancing, and be able to go shopping again.    Pertinent History  H/O ovarian cyst, tonsilectomy, B breast reduction; works 2 jobs standing and walking,    Limitations  chronic leg swelling and associated pain, difficulty walking. impaired standing tolerance, dependent sitting, car transfers,  impaired body image,    Repetition  Increases Symptoms    Special Tests  +Stemmer L>R    Patient Stated Goals  That I'll "....be able to walk and stand without pain, and get the swelling down... so I can get out of these comression socks because I like to wear dresses over the summer."    Currently in Pain?  Yes    Pain Score  4     Pain Location  Leg    Pain Orientation  Left;Right    Pain Descriptors / Indicators  Aching;Crushing;Guarding;Nagging;Pounding;Tender;Throbbing;Sore;Pressure;Burning;Crying;Constant;Discomfort;Heaviness;Tightness;Tingling;Squeezing;Restless;Penetrating;Jabbing;Dull;Cramping;Stabbing;Tiring    Pain Type  Chronic pain    Pain Onset  Other (comment)   pain onset with swelling. Exacerbation sept/Oct 2020 w/out known precipitatng event   Effect of Pain on Daily Activities  chronic leg swelling and associated pain limits standing, walking and dependent sitting tolerance. any activity requiring standing/walking > 3 hours causes worsening symptoms. limits ability to fit LE clothing and preferred street shoes,  limits ability to shop, clean house, work in yard, drive. Limits ability to dance and socialize with friends. Limits ability to exercise and "work out"; negatively impacts body image-covers legs with clothing and wont participate in activities exposing her legs        [facility] OT Assessment - 04/03/20 0001      Assessment   Medical Diagnosis  BLE Lymphedema, L>R    Referring Provider (OT)  Sheppard Plumber, NP    Onset Date/Surgical Date  --   2015 before graduation from [employer] school    Hand Dominance  Right    Prior Therapy  off-the-shelf compression stockings, no hx CDT      Precautions   Precautions  --   LE precautions     Balance Screen   Has the patient fallen in the past 6 months  Yes    Has the patient had a decrease in activity level because of a fear of falling?   Yes    Is the patient reluctant to leave their home because of a fear of falling?   No      Home  Environment   Family/patient expects to be discharged to:  Private residence    Living Arrangements  Alone    Type of Home  Aartment    Home Access  Other (Comment)    Entrance Stairs-Rails  None    Entrance Stairs-Number of Steps  2    Home Layout  One level   2 steps to enter   [employer]  Tub/Shower unit    Shower/tub characteristics  Curtain    Aeronautical engineer  No    Home Equipment  None    Lives With  Alone      Prior Function   Vocation  Full time employment;Other (comment)   FT at retail store, PT as hair stylist   Vocation Requirements  standing, walking, lifting, carying, bending. reaching    Leisure  dancng, read, sing      IADL   Prior Level of Function Shopping  I    Shopping  Takes care of all shopping needs independently   requires seated rest breaks to reduce leg paina nd swelling    Prior Level of Function Light Housekeeping  I    Light Housekeeping  --   requires seated rest breaks to reduce leg pain ad swelling   Prior Level of Function Meal Prep  I    Meal Prep  Plans, prepares and serves adequate meals independently   requires seated rest breaks to reduce leg pain ad swelling   Prior Level of Function Community Mobility  I    Teacher, adult education independently on public transportation;Drives own Geophysical data processor Status  History of falls      Vision - History   Baseline Vision  Wears glasses all the time      Activity Tolerance   Activity Tolerance  Endurance does not limit participation in  activity    Sitting Balance  --   WNL   Activity Tolerance Comments  leg pain and swelling limit all activities requiring dependent positioning > 3-4 hours      Cognition   Overall Cognitive Status  Within Functional Limits for tasks assessed      Observation/Other Assessments   Outcome Measures  Lymphedema Life Impact Scale (LLIS), Lower Extremity Functional Scale, Comparative limb volumetrics  Posture/Postural Control   Posture/Postural Control  No significant limitations      Sensation   Light Touch  Appears Intact      Coordination   Gross Motor Movements are Fluid and Coordinated  Yes    Fine Motor Movements are Fluid and Coordinated  Yes      AROM   Overall AROM   Within functional limits for tasks performed      Strength   Overall Strength  Within functional limits for tasks performed       Mild-moderate, stage II, BLE lymphedema  2/2 CVI, obesity and Hx of orthopedic rtauma  Skin Description Hyper-Keratosis Peau' de Orange Shiny Tight Fibrotic Fatty Doughy Indurated      x Mild at distal leg, and ankles     mild   Hydration Dry Flaky Erythema Other   x slight     Color Redness Present Pallor Blanching Hemosiderin Staining Other   Mild at R foot and distal leg  x     Odor Malodorous Yeast  Absent      x   Temperature Warm Cool wnl   x     Pitting Edema   1+ 2+ 3+ 4+ Non-pitting         x   Girth Symmetrical Asymmetrical Other Distribution   R>L  Below knees   Stemmer Sign Positive Negative     + R -L   Lymphorrea History Of:  Present Absent      x   Wounds History Of Present Absent Venous Arterial Pressure Size      x          Signs of Infection Redness Warmth Erythema Acute Swelling Drainage Borders                   Scars Adhesions Hypersensitivity        Sensation Light Touch Deep pressure Hypersensitivty   intact Impaired intact Impaired Absent Impaired   x   x  x   x  Nails WNL Fungus Present Other   x     Hair  Growth Symmetrical Asymmetrical    x    LYMPHEDEMA/ONCOLOGY QUESTIONNAIRE - 04/02/20 0847      Lymphedema Assessments   Lymphedema Assessments  Lower extremities             OT Treatments/Exercises (OP) - 04/03/20 0001      Transfers   Transfers  Sit to Stand    Sit to Stand  6: Modified independent (Device/Increase time)      ADLs   Overall ADLs  basic and instrumental ADLs requiring standing , walking and/or dependent sitting > 3-4 hours limited bu leg swelling and pain    LB Dressing  impaired 2/2 leg swelling    Functional Mobility  impaired 2/2 leg swelling    Cooking  impaired 2/2 leg swelling    Home Maintenance  impaired 2/2 leg swelling    Driving  impaired 2/2 leg swelling    Work  impaired 2/2 leg swelling    Leisure  impaired 2/2 leg swelling    ADL Education Given  Yes      Manual Therapy   Manual Therapy  Edema management            OT Education - 04/03/20 1324    Education Details  Provided Pt and family education regarding lymphatic structure and function, etiologies, onset patterns and stages of progression. Discussed  impact of obesity on lymphatic function. Outlined  Complete Decongestive Therapy (CDT)  as standard of care and provided in depth information regarding 4 primary components of both Intensive and Self Management Phases, including Manual Lymph Drainage (MLD), compression wrapping and garments, skin care, and therapeutic exercise.   Homero FellersFrank discussion of high burden of care,  Discussed  Importance of daily, ongoing LE self-care essential to retaining clinical gains and limiting progression.  Lastly, reviewed lymphedema precautions, including cellulitis risk and difficulty with wound healing. Provided printed Lymphedema Workbook for reference.    Person(s) Educated  Patient    Methods  Explanation;Demonstration    Comprehension  Verbalized understanding;Returned demonstration;Need further instruction          OT Long Term Goals -  04/03/20 1334      OT LONG TERM GOAL #1   Title  Pt will be able to verbalize signs and symptoms of cellulitis infection , and be able to name 6 common lymphedema precautions using printed resource for reference (modified independence)  to limit LE progression over time .    Baseline  Max A    Time  5    Period  Days    Status  New    Target Date  07/01/20   5th OT visit     OT LONG TERM GOAL #3   Title  Pt will be able to apply BLE, knee length, multi-layer, short stretch compression wraps using correct gradient techniques with modified independence (extra time) to achieve optimal limb volume reduction, and to return affected limb , as closely as possible, to premorbid size and shape.    Baseline  Max A    Time  4    Period  Days    Status  New    Target Date  --   5th OT visit     OT LONG TERM GOAL #4   Title  Pt will achieve at least a 10% BLE limb volume reductions below knees ( ankle- tibial tuberosity A-D) during Intensive Phase CDT to improve AROM at LE joints for functional activities, ambulation and transfers, to reduce infection risk, and to limit LE progression.    Baseline  Max A    Time  12    Period  Weeks    Status  New    Target Date  07/01/20      OT LONG TERM GOAL #5   Title  Pt will achieve and sustain 85% compliance with daily LE self-care home program components throughout Intensive Phase CDT, including impeccable skin care, lymphatic pumping therex, daily sequential pneumatic pump (one leg per day on alternating legs) and custom compression to ensure optimal limb volume reduction, to limit infection risk and to limit LE progression.    Baseline  Max A    Time  12    Period  Weeks    Status  New    Target Date  07/02/20      Long Term Additional Goals   Additional Long Term Goals  Yes      OT LONG TERM GOAL #6   Title  Pt will demonstrate modified independence using assistive devices and extra time  during LE self-care training to don and doff properly  fitting, custom compression garments/devices with Max A for optimal LE self-management over time.    Baseline  Max A    Time  12    Period  Days    Status  New    Target Date  07/02/20  Plan - 04/03/20 1347    Clinical Impression Statement  Mallory Johnston is a 77 y o female presenting with mild, stage II, B lower extremity lymphedema, L>R, secondary to suspected venous insufficiency. I agree that Mallory Johnston Mallory also be an underlying factor in this case since onset pattern and pain description fit w/ Pt's reports, while, primary lymphedema appears less likely due to physical presentation and family history.  Further evaluation of Johnston Mallory prove beneficial to the patient. Leg swelling is visibly greater in the left than on the right where the foot is slightly swollen today. Swelling is non-pitting with mild palpable protein-rich fibrosis. Chronic, progressive LE and associated pain limits Mallory Johnston's functional performance in all occupational domains, including basic and instrumental ADLs, productive activities at work, leisure pursuits, social participation, and Programme researcher, broadcasting/film/video. Mallory Johnston will benefit from skilled Occupational Therapy Complete Decongestive Therapy (CDT) to include manual lymphatic drainage, skin care, therapeutic exercise and compression therapy. The goal of CDT is to reduce limb swelling and associated pain, to limit lymphedema progression and limit further functional decline. Emphasis throughout the OT course for CDT will also focus on Pt education for long term LE self-management. Without skilled OT for CDT LE will progress resulting in progression of the condition, increased infection risk and further functional decline.    OT Occupational Profile and History  Comprehensive Assessment- Review of records and extensive additional review of physical, cognitive, psychosocial history related to current functional performance    Occupational performance deficits  (Please refer to evaluation for details):  ADL's;IADL's;Work;Leisure;Social Participation;Other   body image   Body Structure / Function / Physical Skills  ADL;Decreased knowledge of precautions;Mobility;Decreased knowledge of use of DME;Edema;Skin integrity;Pain;IADL    Rehab Potential  Good    Clinical Decision Making  Several treatment options, min-mod task modification necessary    Comorbidities Affecting Occupational Performance:  Mallory have comorbidities impacting occupational performance    Modification or Assistance to Complete Evaluation   No modification of tasks or assist necessary to complete eval    OT Frequency  2x / week    OT Duration  12 weeks    OT Treatment/Interventions  Self-care/ADL training;Therapeutic exercise;Manual lymph drainage;Patient/family education;Compression bandaging;Building services engineer;Therapeutic activities;Manual Therapy;DME and/or AE instruction    Plan  Complete Decomgestive Therapy, Intensive and Self-Manageent Phase support: Manual lymphatic drainage (MLD) skin care, ther ex, compression wraps and garments. Consider    Recommended Other Services  Fit Pt with BLE, custom, flat knit Jobst Elvarex compression knee highs. Consider Elvarex SOFT, ccl 3 (34-46 mmHg). Consider Jobst RELAX HOS device to limit fibrosis formation. Fit with Flexitouch advanced sequential pneumatic compression device ("PUMP") to aid in daily LE self care at home over time    Consulted and Agree with Plan of Care  Patient       Patient will benefit from skilled therapeutic intervention in order to improve the following deficits and impairments:   Body Structure / Function / Physical Skills: ADL, Decreased knowledge of precautions, Mobility, Decreased knowledge of use of DME, Edema, Skin integrity, Pain, IADL       Visit Diagnosis: Lymphedema, not elsewhere classified - Plan: Ot plan of care cert/re-cert    Problem List Patient Active Problem List   Diagnosis Date Noted   . Swelling of limb 11/29/2019  . Lymphedema 11/29/2019  . Severe recurrent major depression without psychotic features (HCC) 03/07/2019  . Suicidal ideation 03/06/2019  . Noncompliance 03/06/2019  . Class 1 obesity due to excess calories  without serious comorbidity with body mass index (BMI) of 30.0 to 30.9 in adult 02/11/2017  . History of cervical dysplasia 02/11/2017  . Liver lesion, right lobe 10/17/2016  . Von Willebrand's disease ([place]) 11/10/2015  . PCOS (polycystic ovarian Johnston) 09/20/2015    Andrey Spearman, MS, OTR/L, [facility] 04/03/20 2:25 PM   [place] MAIN [facility] SERVICES [address] [place], [place], [address] Phone: [phone]   Fax:  [phone]  Name: JAICE LAGUE MRN: [id number] Date of Birth: 08-Jul-1991

## 2020-04-09 ENCOUNTER — Ambulatory Visit: Payer: BC Managed Care – PPO | Admitting: Occupational Therapy

## 2020-04-09 ENCOUNTER — Other Ambulatory Visit: Payer: Self-pay

## 2020-04-09 DIAGNOSIS — I890 Lymphedema, not elsewhere classified: Secondary | ICD-10-CM | POA: Diagnosis not present

## 2020-04-09 NOTE — Patient Instructions (Signed)

## 2020-04-09 NOTE — Therapy (Signed)
[place] MAIN [facility] SERVICES [address] [place], [place], [address] Phone: [phone]   Fax:  [phone]  Occupational Therapy Treatment  Patient Details  Name: NESTA SCATURRO MRN: [id number] Date of Birth: [birth year] Referring Provider (OT): Eulogio Ditch, NP   Encounter Date: 04/09/2020  OT End of Session - 04/09/20 0948    Visit Number  2    Number of Visits  36    Date for OT Re-Evaluation  07/01/20    OT Start Time  0830    OT Stop Time  0935    OT Time Calculation (min)  65 min    Activity Tolerance  Patient tolerated treatment well;No increased pain    Behavior During Therapy  WFL for tasks assessed/performed       Past Medical History:  Diagnosis Date  . H/O bilateral breast reduction surgery   . Lymphedema of left leg   . Ovarian cyst   . S/P tonsillectomy     Past Surgical History:  Procedure Laterality Date  . BREAST REDUCTION SURGERY    . OVARIAN CYST REMOVAL    . TONSILLECTOMY      There were no vitals filed for this visit.  Subjective Assessment - 04/09/20 0840    Subjective   Hanaa Gurka presents for OT visit 2/36 to address BLE lymphedema and associated pain. Pt reports 7.5/10 pain in her legs this morning, Pt describes pain as "very shooting" at ankles.    Pertinent History  H/O ovarian cyst, tonsilectomy, B breast reduction; works 2 jobs standing and walking,    Limitations  chronic leg swelling and associated pain, difficulty walking. impaired standing tolerance, dependent sitting, car transfers,  impaired body image,    Repetition  Increases Symptoms    Special Tests  +Stemmer L>R    Patient Stated Goals  That I'll "....be able to walk and stand without pain, and get the swelling down... so I can get out of these comression socks because I like to wear dresses over the summer."    Pain Onset  Other (comment)   pain onset with swelling. Exacerbation sept/Oct 2020 w/out known precipitatng event          LYMPHEDEMA/ONCOLOGY QUESTIONNAIRE - 04/09/20 0937      Right Lower Extremity Lymphedema   Other  RLE ankle to politeal limb volume = 4499.20 ml.      Left Lower Extremity Lymphedema   Other  LLE ankle to politeal limb volume = 4825.12 ml.    Other  Inital limb volume differential is mild measuring 7.24%, L>R.              OT Treatments/Exercises (OP) - 04/09/20 0001      ADLs   ADL Education Given  Yes      Manual Therapy   Manual Therapy  Edema management;Compression Bandaging    Edema Management  initial BLE comparative limb volumetrics    Compression Bandaging  multilayered, LLE, foot to popliteal  compression wrap using gradient techniques w/ 8, 10 and 12 cm wide, short stretch wraps over 0.4 cm  thick Rosidal foam and stockinett. no toe wraps             OT Education - 04/09/20 0947    Education Details  Initial Pt edu for LE self care home program, including lymphatic pumping ther ex, diaphragmatic breathing, and gradient compression wrapping.    Person(s) Educated  Patient    Methods  Explanation;Demonstration  Comprehension  Verbalized understanding;Returned demonstration;Need further instruction;Verbal cues required;Tactile cues required          OT Long Term Goals - 04/03/20 1334      OT LONG TERM GOAL #1   Title  Pt will be able to verbalize signs and symptoms of cellulitis infection , and be able to name 6 common lymphedema precautions using printed resource for reference (modified independence)  to limit LE progression over time .    Baseline  Max A    Time  5    Period  Days    Status  New    Target Date  07/01/20   5th OT visit     OT LONG TERM GOAL #3   Title  Pt will be able to apply BLE, knee length, multi-layer, short stretch compression wraps using correct gradient techniques with modified independence (extra time) to achieve optimal limb volume reduction, and to return affected limb , as closely as possible, to premorbid  size and shape.    Baseline  Max A    Time  4    Period  Days    Status  New    Target Date  --   5th OT visit     OT LONG TERM GOAL #4   Title  Pt will achieve at least a 10% BLE limb volume reductions below knees ( ankle- tibial tuberosity A-D) during Intensive Phase CDT to improve AROM at LE joints for functional activities, ambulation and transfers, to reduce infection risk, and to limit LE progression.    Baseline  Max A    Time  12    Period  Weeks    Status  New    Target Date  07/01/20      OT LONG TERM GOAL #5   Title  Pt will achieve and sustain 85% compliance with daily LE self-care home program components throughout Intensive Phase CDT, including impeccable skin care, lymphatic pumping therex, daily sequential pneumatic pump (one leg per day on alternating legs) and custom compression to ensure optimal limb volume reduction, to limit infection risk and to limit LE progression.    Baseline  Max A    Time  12    Period  Weeks    Status  New    Target Date  07/02/20      Long Term Additional Goals   Additional Long Term Goals  Yes      OT LONG TERM GOAL #6   Title  Pt will demonstrate modified independence using assistive devices and extra time  during LE self-care training to don and doff properly fitting, custom compression garments/devices with Max A for optimal LE self-management over time.    Baseline  Max A    Time  12    Period  Days    Status  New    Target Date  07/02/20            Plan - 04/09/20 0941    Clinical Impression Statement  Completed initial BLE comparative limb volumetrics. Inital limb volume differential is mild measuring 7.24%, L>R.Marland Kitchen Applied knee length , multilayered, short stretch compression wrap to LLE using gradient techniques. Pt able to wear existing tennis shoe over wraps after relacing. Provided Pt edu for purpose and results of initial BLE limb volumetrics, gradient compression wrapping, and lymphatic pumping ther ex. Pt  demonstrated ability to perform lower extremity lymphatic pumping there ex during skilled teaching.  Pt instructed to perform 2 sets of  10 each exercise, bilaterally, in sequence, 2 x daily and PRN to facilitate optimal lymphatic function throughout the day.  Pt able to perform all lymphatic pumping therex after skilled instruction. Cont as per POC.    OT Occupational Profile and History  Comprehensive Assessment- Review of records and extensive additional review of physical, cognitive, psychosocial history related to current functional performance    Occupational performance deficits (Please refer to evaluation for details):  ADL's;IADL's;Work;Leisure;Social Participation;Other   body image   Body Structure / Function / Physical Skills  ADL;Decreased knowledge of precautions;Mobility;Decreased knowledge of use of DME;Edema;Skin integrity;Pain;IADL    Rehab Potential  Good    Clinical Decision Making  Several treatment options, min-mod task modification necessary    Comorbidities Affecting Occupational Performance:  May have comorbidities impacting occupational performance    Modification or Assistance to Complete Evaluation   No modification of tasks or assist necessary to complete eval    OT Frequency  2x / week    OT Duration  12 weeks    OT Treatment/Interventions  Self-care/ADL training;Therapeutic exercise;Manual lymph drainage;Patient/family education;Compression bandaging;Building services engineer;Therapeutic activities;Manual Therapy;DME and/or AE instruction    Plan  Complete Decomgestive Therapy, Intensive and Self-Manageent Phase support: Manual lymphatic drainage (MLD) skin care, ther ex, compression wraps and garments. Consider    Recommended Other Services  Fit Pt with BLE, custom, flat knit Jobst Elvarex compression knee highs. Consider Elvarex SOFT, ccl 3 (34-46 mmHg). Consider Jobst RELAX HOS device to limit fibrosis formation. Fit with Flexitouch advanced sequential pneumatic  compression device ("PUMP") to aid in daily LE self care at home over time    Consulted and Agree with Plan of Care  Patient       Patient will benefit from skilled therapeutic intervention in order to improve the following deficits and impairments:   Body Structure / Function / Physical Skills: ADL, Decreased knowledge of precautions, Mobility, Decreased knowledge of use of DME, Edema, Skin integrity, Pain, IADL       Visit Diagnosis: Lymphedema, not elsewhere classified    Problem List Patient Active Problem List   Diagnosis Date Noted  . Swelling of limb 11/29/2019  . Lymphedema 11/29/2019  . Severe recurrent major depression without psychotic features (HCC) 03/07/2019  . Suicidal ideation 03/06/2019  . Noncompliance 03/06/2019  . Class 1 obesity due to excess calories without serious comorbidity with body mass index (BMI) of 30.0 to 30.9 in adult 02/11/2017  . History of cervical dysplasia 02/11/2017  . Liver lesion, right lobe 10/17/2016  . Von Willebrand's disease (HCC) 11/10/2015  . PCOS (polycystic ovarian syndrome) 09/20/2015   Loel Dubonnet, MS, OTR/L, [facility] 04/09/20 9:51 AM   [place] [facility] MAIN [facility] SERVICES [address] [place], [place], [address] Phone: [phone]   Fax:  [phone]  Name: CHRISTYANA CORWIN MRN: [id number] Date of Birth: January 07, 1991

## 2020-04-12 ENCOUNTER — Ambulatory Visit: Payer: BC Managed Care – PPO | Admitting: Occupational Therapy

## 2020-04-12 ENCOUNTER — Other Ambulatory Visit: Payer: Self-pay

## 2020-04-12 DIAGNOSIS — I890 Lymphedema, not elsewhere classified: Secondary | ICD-10-CM | POA: Diagnosis not present

## 2020-04-12 NOTE — Therapy (Signed)
[place] MAIN [facility] SERVICES [address] [place], [place], [address] Phone: [phone]   Fax:  [phone]  Occupational Therapy Treatment  Patient Details  Name: Mallory Johnston MRN: [id number] Date of Birth: March 04, 1991 Referring Provider (OT): Eulogio Ditch, NP   Encounter Date: 04/12/2020  OT End of Session - 04/12/20 1450    Visit Number  3    Number of Visits  36    Date for OT Re-Evaluation  07/01/20    OT Start Time  0200    OT Stop Time  0248    OT Time Calculation (min)  48 min    Activity Tolerance  Patient tolerated treatment well;No increased pain    Behavior During Therapy  WFL for tasks assessed/performed       Past Medical History:  Diagnosis Date  . H/O bilateral breast reduction surgery   . Lymphedema of left leg   . Ovarian cyst   . S/P tonsillectomy     Past Surgical History:  Procedure Laterality Date  . BREAST REDUCTION SURGERY    . OVARIAN CYST REMOVAL    . TONSILLECTOMY      There were no vitals filed for this visit.  Subjective Assessment - 04/12/20 1412    Subjective   Mallory Johnston presents for OT visit 3/36 to address BLE lymphedema and associated pain. Pt reports 0/10 pain in her legs today. Pt reports she left compression wraps   in place on LLE for 48 hours after last visit. INstructions were to try to tolerate for 24 hours. Pt verbalized understanding of 24 hours only directions   to ensure time for skin inspection and bathing.    Pertinent History  H/O ovarian cyst, tonsilectomy, B breast reduction; works 2 jobs standing and walking,    Limitations  chronic leg swelling and associated pain, difficulty walking. impaired standing tolerance, dependent sitting, car transfers,  impaired body image,    Repetition  Increases Symptoms    Special Tests  +Stemmer L>R    Patient Stated Goals  That I'll "....be able to walk and stand without pain, and get the swelling down... so I can get out of these  comression socks because I like to wear dresses over the summer."    Pain Onset  Other (comment)   pain onset with swelling. Exacerbation sept/Oct 2020 w/out known precipitatng event                  OT Treatments/Exercises (OP) - 04/12/20 0001      ADLs   ADL Education Given  Yes      Manual Therapy   Manual Therapy  Edema management;Compression Bandaging    Edema Management  LE self care training             OT Education - 04/12/20 1449    Education Details  Pt edu for LE self care home progream: multilayer gradient compression wrapping-LLE    Person(s) Educated  Patient    Methods  Explanation;Demonstration    Comprehension  Verbalized understanding;Returned demonstration;Need further instruction;Verbal cues required;Tactile cues required          OT Long Term Goals - 04/03/20 1334      OT LONG TERM GOAL #1   Title  Pt will be able to verbalize signs and symptoms of cellulitis infection , and be able to name 6 common lymphedema precautions using printed resource for reference (modified independence)  to limit LE progression over time .  Baseline  Max A    Time  5    Period  Days    Status  New    Target Date  07/01/20   5th OT visit     OT LONG TERM GOAL #3   Title  Pt will be able to apply BLE, knee length, multi-layer, short stretch compression wraps using correct gradient techniques with modified independence (extra time) to achieve optimal limb volume reduction, and to return affected limb , as closely as possible, to premorbid size and shape.    Baseline  Max A    Time  4    Period  Days    Status  New    Target Date  --   5th OT visit     OT LONG TERM GOAL #4   Title  Pt will achieve at least a 10% BLE limb volume reductions below knees ( ankle- tibial tuberosity A-D) during Intensive Phase CDT to improve AROM at LE joints for functional activities, ambulation and transfers, to reduce infection risk, and to limit LE progression.     Baseline  Max A    Time  12    Period  Weeks    Status  New    Target Date  07/01/20      OT LONG TERM GOAL #5   Title  Pt will achieve and sustain 85% compliance with daily LE self-care home program components throughout Intensive Phase CDT, including impeccable skin care, lymphatic pumping therex, daily sequential pneumatic pump (one leg per day on alternating legs) and custom compression to ensure optimal limb volume reduction, to limit infection risk and to limit LE progression.    Baseline  Max A    Time  12    Period  Weeks    Status  New    Target Date  07/02/20      Long Term Additional Goals   Additional Long Term Goals  Yes      OT LONG TERM GOAL #6   Title  Pt will demonstrate modified independence using assistive devices and extra time  during LE self-care training to don and doff properly fitting, custom compression garments/devices with Max A for optimal LE self-management over time.    Baseline  Max A    Time  12    Period  Days    Status  New    Target Date  07/02/20            Plan - 04/12/20 1451    Clinical Impression Statement  Session spent on teaching compression wrapping LLE using multilayer, gradient technique w short stretch compressions wraps over osidal foam. After skilled teaching Pt able to apply 4 layer wrap with mod A. Pt will practice daily  during 1 wk visit interval. I feel confident she will have perfected her technique by then. Cont as per POC.    OT Occupational Profile and History  Comprehensive Assessment- Review of records and extensive additional review of physical, cognitive, psychosocial history related to current functional performance    Occupational performance deficits (Please refer to evaluation for details):  ADL's;IADL's;Work;Leisure;Social Participation;Other   body image   Body Structure / Function / Physical Skills  ADL;Decreased knowledge of precautions;Mobility;Decreased knowledge of use of DME;Edema;Skin integrity;Pain;IADL     Rehab Potential  Good    Clinical Decision Making  Several treatment options, min-mod task modification necessary    Comorbidities Affecting Occupational Performance:  May have comorbidities impacting occupational performance    Modification  or Assistance to Complete Evaluation   No modification of tasks or assist necessary to complete eval    OT Frequency  2x / week    OT Duration  12 weeks    OT Treatment/Interventions  Self-care/ADL training;Therapeutic exercise;Manual lymph drainage;Patient/family education;Compression bandaging;Building services engineer;Therapeutic activities;Manual Therapy;DME and/or AE instruction    Plan  Complete Decomgestive Therapy, Intensive and Self-Manageent Phase support: Manual lymphatic drainage (MLD) skin care, ther ex, compression wraps and garments. Consider    Recommended Other Services  Fit Pt with BLE, custom, flat knit Jobst Elvarex compression knee highs. Consider Elvarex SOFT, ccl 3 (34-46 mmHg). Consider Jobst RELAX HOS device to limit fibrosis formation. Fit with Flexitouch advanced sequential pneumatic compression device ("PUMP") to aid in daily LE self care at home over time    Consulted and Agree with Plan of Care  Patient       Patient will benefit from skilled therapeutic intervention in order to improve the following deficits and impairments:   Body Structure / Function / Physical Skills: ADL, Decreased knowledge of precautions, Mobility, Decreased knowledge of use of DME, Edema, Skin integrity, Pain, IADL       Visit Diagnosis: Lymphedema, not elsewhere classified    Problem List Patient Active Problem List   Diagnosis Date Noted  . Swelling of limb 11/29/2019  . Lymphedema 11/29/2019  . Severe recurrent major depression without psychotic features (HCC) 03/07/2019  . Suicidal ideation 03/06/2019  . Noncompliance 03/06/2019  . Class 1 obesity due to excess calories without serious comorbidity with body mass index (BMI) of 30.0  to 30.9 in adult 02/11/2017  . History of cervical dysplasia 02/11/2017  . Liver lesion, right lobe 10/17/2016  . Von Willebrand's disease (HCC) 11/10/2015  . PCOS (polycystic ovarian syndrome) 09/20/2015   Loel Dubonnet, MS, OTR/L, [facility] 04/12/20 2:53 PM   [place] [facility] MAIN [facility] SERVICES [address] [place], [place], [address] Phone: [phone]   Fax:  [phone]  Name: Mallory Johnston MRN: [id number] Date of Birth: 1991/05/16

## 2020-04-16 ENCOUNTER — Ambulatory Visit: Payer: BC Managed Care – PPO | Attending: Internal Medicine

## 2020-04-16 DIAGNOSIS — Z23 Encounter for immunization: Secondary | ICD-10-CM

## 2020-04-16 NOTE — Progress Notes (Signed)
   Covid-19 Vaccination Clinic  Name:  Mallory Johnston    MRN: [id number] DOB: 06/03/1991  04/16/2020  [name] was observed post Covid-19 immunization for 15 minutes without incident. She was provided with Vaccine Information Sheet and instruction to access the V-Safe system.   [name] was instructed to call 911 with any severe reactions post vaccine: Marland Kitchen Difficulty breathing  . Swelling of face and throat  . A fast heartbeat  . A bad rash all over body  . Dizziness and weakness   Immunizations Administered    Name Date Dose VIS Date Route   Pfizer COVID-19 Vaccine 04/16/2020  9:04 AM 0.3 mL 02/22/2019 Intramuscular   Manufacturer: [employer], [employer]   Lot: [id number]   NDC: [address]-5

## 2020-04-19 ENCOUNTER — Ambulatory Visit: Payer: BC Managed Care – PPO | Admitting: Occupational Therapy

## 2020-04-19 ENCOUNTER — Other Ambulatory Visit: Payer: Self-pay

## 2020-04-19 DIAGNOSIS — I890 Lymphedema, not elsewhere classified: Secondary | ICD-10-CM

## 2020-04-19 NOTE — Therapy (Signed)
[place] MAIN [facility] SERVICES [address] [place], [place], [address] Phone: [phone]   Fax:  [phone]  Occupational Therapy Treatment  Patient Details  Name: Mallory Johnston MRN: [id number] Date of Birth: [birth year] Referring Provider (OT): Eulogio Ditch, NP   Encounter Date: 04/19/2020  OT End of Session - 04/19/20 1611    Visit Number  4    Number of Visits  36    Date for OT Re-Evaluation  07/01/20    OT Start Time  0200    OT Stop Time  0300    OT Time Calculation (min)  60 min    Activity Tolerance  Patient tolerated treatment well    Behavior During Therapy  [facility] for tasks assessed/performed       Past Medical History:  Diagnosis Date  . H/O bilateral breast reduction surgery   . Lymphedema of left leg   . Ovarian cyst   . S/P tonsillectomy     Past Surgical History:  Procedure Laterality Date  . BREAST REDUCTION SURGERY    . OVARIAN CYST REMOVAL    . TONSILLECTOMY      There were no vitals filed for this visit.  Subjective Assessment - 04/19/20 1409    Subjective   Mallory Johnston presents for OT visit 4/36 to address BLE lymphedema and associated pain. Pt reports 8/10 pain in her legs today. Pt reports leg pain reduces with walking. Pt reports some difficulty with the logistics of compression wrapping during visit interval. "I got a love seat and that made doing it easier. I just need to get my timing right."    Pertinent History  H/O ovarian cyst, tonsilectomy, B breast reduction; works 2 jobs standing and walking,    Limitations  chronic leg swelling and associated pain, difficulty walking. impaired standing tolerance, dependent sitting, car transfers,  impaired body image,    Repetition  Increases Symptoms    Special Tests  +Stemmer L>R    Patient Stated Goals  That I'll "....be able to walk and stand without pain, and get the swelling down... so I can get out of these comression socks because I like to wear  dresses over the summer."    Currently in Pain?  Yes    Pain Score  8     Pain Location  Leg    Pain Orientation  Right;Left    Pain Type  Chronic pain    Pain Onset  Other (comment)   pain onset with swelling. Exacerbation sept/Oct 2020 w/out known precipitatng event   Pain Frequency  Intermittent                   OT Treatments/Exercises (OP) - 04/19/20 0001      ADLs   ADL Education Given  Yes      Manual Therapy   Manual Therapy  Edema management;Manual Lymphatic Drainage (MLD);Compression Bandaging    Manual Lymphatic Drainage (MLD)  LLE MLD utilizing shrt neck sequence, deep abdominal pathway , functionall inguinal LN and UJ strokes to LLE from proximal to distal.    Compression Bandaging  multilayered, LLE, foot to popliteal  compression wrap using gradient techniques w/ 8, 10 and 12 cm wide, short stretch wraps over 0.4 cm  thick Rosidal foam and stockinett. no toe wraps             OT Education - 04/19/20 1611    Education Details  Continued Pt edu for LE self care home  progream: multilayer gradient compression wrapping-LLE    Person(s) Educated  Patient    Methods  Explanation;Demonstration    Comprehension  Verbalized understanding;Returned demonstration;Need further instruction;Verbal cues required;Tactile cues required          OT Long Term Goals - 04/03/20 1334      OT LONG TERM GOAL #1   Title  Pt will be able to verbalize signs and symptoms of cellulitis infection , and be able to name 6 common lymphedema precautions using printed resource for reference (modified independence)  to limit LE progression over time .    Baseline  Max A    Time  5    Period  Days    Status  New    Target Date  07/01/20   5th OT visit     OT LONG TERM GOAL #3   Title  Pt will be able to apply BLE, knee length, multi-layer, short stretch compression wraps using correct gradient techniques with modified independence (extra time) to achieve optimal limb volume  reduction, and to return affected limb , as closely as possible, to premorbid size and shape.    Baseline  Max A    Time  4    Period  Days    Status  New    Target Date  --   5th OT visit     OT LONG TERM GOAL #4   Title  Pt will achieve at least a 10% BLE limb volume reductions below knees ( ankle- tibial tuberosity A-D) during Intensive Phase CDT to improve AROM at LE joints for functional activities, ambulation and transfers, to reduce infection risk, and to limit LE progression.    Baseline  Max A    Time  12    Period  Weeks    Status  New    Target Date  07/01/20      OT LONG TERM GOAL #5   Title  Pt will achieve and sustain 85% compliance with daily LE self-care home program components throughout Intensive Phase CDT, including impeccable skin care, lymphatic pumping therex, daily sequential pneumatic pump (one leg per day on alternating legs) and custom compression to ensure optimal limb volume reduction, to limit infection risk and to limit LE progression.    Baseline  Max A    Time  12    Period  Weeks    Status  New    Target Date  07/02/20      Long Term Additional Goals   Additional Long Term Goals  Yes      OT LONG TERM GOAL #6   Title  Pt will demonstrate modified independence using assistive devices and extra time  during LE self-care training to don and doff properly fitting, custom compression garments/devices with Max A for optimal LE self-management over time.    Baseline  Max A    Time  12    Period  Days    Status  New    Target Date  07/02/20            Plan - 04/19/20 1612    Clinical Impression Statement  Commenced LLE MLD utilizing short neck sequence, deep abdominal pathway, functional inguinal lymph nodes and J-strokes to thigh, knee , leg and foot-proximal to distal to reduce back pressure. Pt tolerated manual therapy without increased pain. Reviewed gradient compression wrap techniques durng knee length, LLE compression wrap application as  established. Cont as per POC.    OT Occupational Profile and  History  Comprehensive Assessment- Review of records and extensive additional review of physical, cognitive, psychosocial history related to current functional performance    Occupational performance deficits (Please refer to evaluation for details):  ADL's;IADL's;Work;Leisure;Social Participation;Other   body image   Body Structure / Function / Physical Skills  ADL;Decreased knowledge of precautions;Mobility;Decreased knowledge of use of DME;Edema;Skin integrity;Pain;IADL    Rehab Potential  Good    Clinical Decision Making  Several treatment options, min-mod task modification necessary    Comorbidities Affecting Occupational Performance:  May have comorbidities impacting occupational performance    Modification or Assistance to Complete Evaluation   No modification of tasks or assist necessary to complete eval    OT Frequency  2x / week    OT Duration  12 weeks    OT Treatment/Interventions  Self-care/ADL training;Therapeutic exercise;Manual lymph drainage;Patient/family education;Compression bandaging;Building services engineer;Therapeutic activities;Manual Therapy;DME and/or AE instruction    Plan  Complete Decomgestive Therapy, Intensive and Self-Manageent Phase support: Manual lymphatic drainage (MLD) skin care, ther ex, compression wraps and garments. Consider    Recommended Other Services  Fit Pt with BLE, custom, flat knit Jobst Elvarex compression knee highs. Consider Elvarex SOFT, ccl 3 (34-46 mmHg). Consider Jobst RELAX HOS device to limit fibrosis formation. Fit with Flexitouch advanced sequential pneumatic compression device ("PUMP") to aid in daily LE self care at home over time    Consulted and Agree with Plan of Care  Patient       Patient will benefit from skilled therapeutic intervention in order to improve the following deficits and impairments:   Body Structure / Function / Physical Skills: ADL, Decreased knowledge  of precautions, Mobility, Decreased knowledge of use of DME, Edema, Skin integrity, Pain, IADL       Visit Diagnosis: Lymphedema, not elsewhere classified    Problem List Patient Active Problem List   Diagnosis Date Noted  . Swelling of limb 11/29/2019  . Lymphedema 11/29/2019  . Severe recurrent major depression without psychotic features (HCC) 03/07/2019  . Suicidal ideation 03/06/2019  . Noncompliance 03/06/2019  . Class 1 obesity due to excess calories without serious comorbidity with body mass index (BMI) of 30.0 to 30.9 in adult 02/11/2017  . History of cervical dysplasia 02/11/2017  . Liver lesion, right lobe 10/17/2016  . Von Willebrand's disease (HCC) 11/10/2015  . PCOS (polycystic ovarian syndrome) 09/20/2015    Loel Dubonnet, MS, OTR/L, [facility] 04/19/20 4:15 PM   [facility] MAIN [facility] SERVICES [address] [place], [place], [address] Phone: [phone]   Fax:  [phone]  Name: Mallory Johnston MRN: [id number] Date of Birth: [birth year]

## 2020-04-24 ENCOUNTER — Encounter: Payer: BC Managed Care – PPO | Admitting: Occupational Therapy

## 2020-04-26 ENCOUNTER — Other Ambulatory Visit: Payer: Self-pay

## 2020-04-26 ENCOUNTER — Ambulatory Visit: Payer: BC Managed Care – PPO | Admitting: Occupational Therapy

## 2020-04-26 DIAGNOSIS — I890 Lymphedema, not elsewhere classified: Secondary | ICD-10-CM

## 2020-04-26 NOTE — Therapy (Signed)
[place] MAIN [facility] SERVICES [address] [place], [place], [address] Phone: [phone]   Fax:  [phone]  Occupational Therapy Treatment  Patient Details  Name: Mallory Johnston MRN: [id number] Date of Birth: 1991-03-17 Referring Provider (OT): Eulogio Ditch, NP   Encounter Date: 04/26/2020  OT End of Session - 04/26/20 1233    Visit Number  5    Number of Visits  36    Date for OT Re-Evaluation  07/01/20    OT Start Time  0909    OT Stop Time  1005    OT Time Calculation (min)  56 min    Activity Tolerance  Patient tolerated treatment well    Behavior During Therapy  [facility] for tasks assessed/performed       Past Medical History:  Diagnosis Date  . H/O bilateral breast reduction surgery   . Lymphedema of left leg   . Ovarian cyst   . S/P tonsillectomy     Past Surgical History:  Procedure Laterality Date  . BREAST REDUCTION SURGERY    . OVARIAN CYST REMOVAL    . TONSILLECTOMY      There were no vitals filed for this visit.  Subjective Assessment - 04/26/20 1228    Subjective   Mallory Johnston presents for OT visit 5/36 to address BLE lymphedema and associated pain. Pt reports leg pain is unchanged from last visit. Pt reports she missed last session due to overnight work shift.    Pertinent History  H/O ovarian cyst, tonsilectomy, B breast reduction; works 2 jobs standing and walking,    Currently in Pain?  Yes                   OT Treatments/Exercises (OP) - 04/26/20 0001      ADLs   ADL Education Given  Yes      Manual Therapy   Manual Therapy  Edema management;Manual Lymphatic Drainage (MLD);Compression Bandaging    Manual Lymphatic Drainage (MLD)  LLE MLD utilizing shrt neck sequence, deep abdominal pathway , functionall inguinal LN and UJ strokes to LLE from proximal to distal.    Compression Bandaging  multilayered, LLE, foot to popliteal  compression wrap using gradient techniques w/ 8, 10 and 12 cm  wide, short stretch wraps over 0.4 cm  thick Rosidal foam and stockinett. no toe wraps             OT Education - 04/26/20 1232    Education Details  Continued skilled Pt/caregiver education  And LE ADL training throughout visit for lymphedema self care/ home program, including compression wrapping, compression garment and device wear/care, lymphatic pumping ther ex, simple self-MLD, and skin care. Discussed progress towards goals.    Person(s) Educated  Patient    Methods  Explanation;Demonstration    Comprehension  Verbalized understanding;Returned demonstration;Need further instruction;Verbal cues required;Tactile cues required          OT Long Term Goals - 04/03/20 1334      OT LONG TERM GOAL #1   Title  Pt will be able to verbalize signs and symptoms of cellulitis infection , and be able to name 6 common lymphedema precautions using printed resource for reference (modified independence)  to limit LE progression over time .    Baseline  Max A    Time  5    Period  Days    Status  New    Target Date  07/01/20   5th OT visit  OT LONG TERM GOAL #3   Title  Pt will be able to apply BLE, knee length, multi-layer, short stretch compression wraps using correct gradient techniques with modified independence (extra time) to achieve optimal limb volume reduction, and to return affected limb , as closely as possible, to premorbid size and shape.    Baseline  Max A    Time  4    Period  Days    Status  New    Target Date  --   5th OT visit     OT LONG TERM GOAL #4   Title  Pt will achieve at least a 10% BLE limb volume reductions below knees ( ankle- tibial tuberosity A-D) during Intensive Phase CDT to improve AROM at LE joints for functional activities, ambulation and transfers, to reduce infection risk, and to limit LE progression.    Baseline  Max A    Time  12    Period  Weeks    Status  New    Target Date  07/01/20      OT LONG TERM GOAL #5   Title  Pt will achieve  and sustain 85% compliance with daily LE self-care home program components throughout Intensive Phase CDT, including impeccable skin care, lymphatic pumping therex, daily sequential pneumatic pump (one leg per day on alternating legs) and custom compression to ensure optimal limb volume reduction, to limit infection risk and to limit LE progression.    Baseline  Max A    Time  12    Period  Weeks    Status  New    Target Date  07/02/20      Long Term Additional Goals   Additional Long Term Goals  Yes      OT LONG TERM GOAL #6   Title  Pt will demonstrate modified independence using assistive devices and extra time  during LE self-care training to don and doff properly fitting, custom compression garments/devices with Max A for optimal LE self-management over time.    Baseline  Max A    Time  12    Period  Days    Status  New    Target Date  07/02/20            Plan - 04/26/20 1234    Clinical Impression Statement  Pt presents with compression wraps in place as directed. Limb swelling is well managed this morning and wrapping techniques are correct. Pt demos good compliance w/ LE self care for skin care, compression bandaging so far. MLD, skin care and knee length compression wrappingto LLE well tolerated today without increased pain. Cont as per OC.    OT Occupational Profile and History  Comprehensive Assessment- Review of records and extensive additional review of physical, cognitive, psychosocial history related to current functional performance    Occupational performance deficits (Please refer to evaluation for details):  ADL's;IADL's;Work;Leisure;Social Participation;Other   body image   Body Structure / Function / Physical Skills  ADL;Decreased knowledge of precautions;Mobility;Decreased knowledge of use of DME;Edema;Skin integrity;Pain;IADL    Rehab Potential  Good    Clinical Decision Making  Several treatment options, min-mod task modification necessary    Comorbidities  Affecting Occupational Performance:  May have comorbidities impacting occupational performance    Modification or Assistance to Complete Evaluation   No modification of tasks or assist necessary to complete eval    OT Frequency  2x / week    OT Duration  12 weeks    OT Treatment/Interventions  Self-care/ADL training;Therapeutic exercise;Manual lymph drainage;Patient/family education;Compression bandaging;Building services engineer;Therapeutic activities;Manual Therapy;DME and/or AE instruction    Plan  Complete Decomgestive Therapy, Intensive and Self-Manageent Phase support: Manual lymphatic drainage (MLD) skin care, ther ex, compression wraps and garments. Consider    Recommended Other Services  Fit Pt with BLE, custom, flat knit Jobst Elvarex compression knee highs. Consider Elvarex SOFT, ccl 3 (34-46 mmHg). Consider Jobst RELAX HOS device to limit fibrosis formation. Fit with Flexitouch advanced sequential pneumatic compression device ("PUMP") to aid in daily LE self care at home over time    Consulted and Agree with Plan of Care  Patient       Patient will benefit from skilled therapeutic intervention in order to improve the following deficits and impairments:   Body Structure / Function / Physical Skills: ADL, Decreased knowledge of precautions, Mobility, Decreased knowledge of use of DME, Edema, Skin integrity, Pain, IADL       Visit Diagnosis: Lymphedema, not elsewhere classified    Problem List Patient Active Problem List   Diagnosis Date Noted  . Swelling of limb 11/29/2019  . Lymphedema 11/29/2019  . Severe recurrent major depression without psychotic features (HCC) 03/07/2019  . Suicidal ideation 03/06/2019  . Noncompliance 03/06/2019  . Class 1 obesity due to excess calories without serious comorbidity with body mass index (BMI) of 30.0 to 30.9 in adult 02/11/2017  . History of cervical dysplasia 02/11/2017  . Liver lesion, right lobe 10/17/2016  . Von Willebrand's  disease (HCC) 11/10/2015  . PCOS (polycystic ovarian syndrome) 09/20/2015    Loel Dubonnet, MS, OTR/L, [facility] 04/26/20 12:38 PM   [place] [facility] MAIN [facility] SERVICES [address] [place], [place], [address] Phone: [phone]   Fax:  [phone]  Name: Mallory Johnston MRN: [id number] Date of Birth: October 02, 1991

## 2020-04-30 ENCOUNTER — Other Ambulatory Visit: Payer: Self-pay

## 2020-04-30 ENCOUNTER — Ambulatory Visit: Payer: BC Managed Care – PPO | Attending: Nurse Practitioner | Admitting: Occupational Therapy

## 2020-04-30 DIAGNOSIS — Q820 Hereditary lymphedema: Secondary | ICD-10-CM | POA: Diagnosis present

## 2020-04-30 DIAGNOSIS — I890 Lymphedema, not elsewhere classified: Secondary | ICD-10-CM | POA: Insufficient documentation

## 2020-04-30 NOTE — Therapy (Signed)
[facility] MAIN [facility] SERVICES [address] [place], [place], [address] Phone: [phone]   Fax:  [phone]  Occupational Therapy Treatment  Patient Details  Name: Mallory Johnston MRN: [id number] Date of Birth: [birth year] Referring Provider (OT): Sheppard Plumber, NP   Encounter Date: 04/30/2020  OT End of Session - 04/30/20 0818    Visit Number  6    Number of Visits  36    Date for OT Re-Evaluation  07/01/20    OT Start Time  0810    OT Stop Time  0907    OT Time Calculation (min)  57 min    Activity Tolerance  Patient tolerated treatment well;No increased pain       Past Medical History:  Diagnosis Date  . H/O bilateral breast reduction surgery   . Lymphedema of left leg   . Ovarian cyst   . S/P tonsillectomy     Past Surgical History:  Procedure Laterality Date  . BREAST REDUCTION SURGERY    . OVARIAN CYST REMOVAL    . TONSILLECTOMY      There were no vitals filed for this visit.  Subjective Assessment - 04/30/20 0819    Subjective   Mallory Johnston presents for OT visit 5/36 to address BLE lymphedema and associated pain. Pt reports leg pain is unchanged from last visit. Pt reports she missed last session due to overnight work shift.    Pertinent History  H/O ovarian cyst, tonsilectomy, B breast reduction; works 2 jobs standing and walking,                           OT Education - 04/30/20 1629    Education Details  Pt edu for simple self MLD- taught anatomy, physiology and "J stroke". Handout provided.    Person(s) Educated  Patient    Methods  Explanation;Demonstration;Handout    Comprehension  Verbalized understanding;Returned demonstration;Need further instruction          OT Long Term Goals - 04/03/20 1334      OT LONG TERM GOAL #1   Title  Pt will be able to verbalize signs and symptoms of cellulitis infection , and be able to name 6 common lymphedema precautions using printed resource for  reference (modified independence)  to limit LE progression over time .    Baseline  Max A    Time  5    Period  Days    Status  New    Target Date  07/01/20   5th OT visit     OT LONG TERM GOAL #3   Title  Pt will be able to apply BLE, knee length, multi-layer, short stretch compression wraps using correct gradient techniques with modified independence (extra time) to achieve optimal limb volume reduction, and to return affected limb , as closely as possible, to premorbid size and shape.    Baseline  Max A    Time  4    Period  Days    Status  New    Target Date  --   5th OT visit     OT LONG TERM GOAL #4   Title  Pt will achieve at least a 10% BLE limb volume reductions below knees ( ankle- tibial tuberosity A-D) during Intensive Phase CDT to improve AROM at LE joints for functional activities, ambulation and transfers, to reduce infection risk, and to limit LE progression.    Baseline  Max A  Time  12    Period  Weeks    Status  New    Target Date  07/01/20      OT LONG TERM GOAL #5   Title  Pt will achieve and sustain 85% compliance with daily LE self-care home program components throughout Intensive Phase CDT, including impeccable skin care, lymphatic pumping therex, daily sequential pneumatic pump (one leg per day on alternating legs) and custom compression to ensure optimal limb volume reduction, to limit infection risk and to limit LE progression.    Baseline  Max A    Time  12    Period  Weeks    Status  New    Target Date  07/02/20      Long Term Additional Goals   Additional Long Term Goals  Yes      OT LONG TERM GOAL #6   Title  Pt will demonstrate modified independence using assistive devices and extra time  during LE self-care training to don and doff properly fitting, custom compression garments/devices with Max A for optimal LE self-management over time.    Baseline  Max A    Time  12    Period  Days    Status  New    Target Date  07/02/20             Plan - 04/30/20 1632    Clinical Impression Statement  LLE limb swelling is well reduced, yet Pt continues to c/o leg pain and ankles. I suspect this is most likely vascular vs lymphatic at this stage. Pt tolerates MLD without increased pain and she is fully compliant with compression between sessions. Pt may benefit from Flexitouch sequential pneumatic compression device  for use at home between sessions and after Py transitions to LLE Management Phase CDT. Demographics sent 04/03/20 to explore benefits. Requested update from rep. Cont as per POC.Consider garment measurements later this week.    OT Occupational Profile and History  Comprehensive Assessment- Review of records and extensive additional review of physical, cognitive, psychosocial history related to current functional performance    Occupational performance deficits (Please refer to evaluation for details):  ADL's;IADL's;Work;Leisure;Social Participation;Other   body image   Body Structure / Function / Physical Skills  ADL;Decreased knowledge of precautions;Mobility;Decreased knowledge of use of DME;Edema;Skin integrity;Pain;IADL    Rehab Potential  Good    Clinical Decision Making  Several treatment options, min-mod task modification necessary    Comorbidities Affecting Occupational Performance:  May have comorbidities impacting occupational performance    Modification or Assistance to Complete Evaluation   No modification of tasks or assist necessary to complete eval    OT Frequency  2x / week    OT Duration  12 weeks    OT Treatment/Interventions  Self-care/ADL training;Therapeutic exercise;Manual lymph drainage;Patient/family education;Compression bandaging;Therapist, nutritional;Therapeutic activities;Manual Therapy;DME and/or AE instruction    Plan  Complete Decomgestive Therapy, Intensive and Self-Manageent Phase support: Manual lymphatic drainage (MLD) skin care, ther ex, compression wraps and garments. Consider     Recommended Other Services  Fit Pt with BLE, custom, flat knit Jobst Elvarex compression knee highs. Consider Elvarex SOFT, ccl 3 (34-46 mmHg). Consider Jobst RELAX HOS device to limit fibrosis formation. Fit with Flexitouch advanced sequential pneumatic compression device ("PUMP") to aid in daily LE self care at home over time    Consulted and Agree with Plan of Care  Patient       Patient will benefit from skilled therapeutic intervention in order to improve the  following deficits and impairments:   Body Structure / Function / Physical Skills: ADL, Decreased knowledge of precautions, Mobility, Decreased knowledge of use of DME, Edema, Skin integrity, Pain, IADL       Visit Diagnosis: Lymphedema, not elsewhere classified    Problem List Patient Active Problem List   Diagnosis Date Noted  . Swelling of limb 11/29/2019  . Lymphedema 11/29/2019  . Severe recurrent major depression without psychotic features (HCC) 03/07/2019  . Suicidal ideation 03/06/2019  . Noncompliance 03/06/2019  . Class 1 obesity due to excess calories without serious comorbidity with body mass index (BMI) of 30.0 to 30.9 in adult 02/11/2017  . History of cervical dysplasia 02/11/2017  . Liver lesion, right lobe 10/17/2016  . Von Willebrand's disease (HCC) 11/10/2015  . PCOS (polycystic ovarian syndrome) 09/20/2015   Loel Dubonnet, MS, OTR/L, [facility] 04/30/20 4:47 PM   [facility] [place] [facility] MAIN [facility] SERVICES [address] [place], [place], [address] Phone: [phone]   Fax:  [phone]  Name: Mallory Johnston MRN: [id number] Date of Birth: 04/29/1991

## 2020-05-10 ENCOUNTER — Other Ambulatory Visit: Payer: Self-pay

## 2020-05-10 ENCOUNTER — Ambulatory Visit: Payer: BC Managed Care – PPO | Admitting: Occupational Therapy

## 2020-05-10 DIAGNOSIS — I890 Lymphedema, not elsewhere classified: Secondary | ICD-10-CM

## 2020-05-10 DIAGNOSIS — Q820 Hereditary lymphedema: Secondary | ICD-10-CM

## 2020-05-10 NOTE — Therapy (Signed)
[place] MAIN [facility] SERVICES [address] [place], [place], [address] Phone: [phone]   Fax:  [phone]  Occupational Therapy Treatment  Patient Details  Name: Mallory Johnston MRN: [id number] Date of Birth: 07-26-1991 Referring Provider (OT): Eulogio Ditch, NP   Encounter Date: 05/10/2020  OT End of Session - 05/10/20 1419    Visit Number  7    Number of Visits  36    Date for OT Re-Evaluation  07/01/20    OT Start Time  0205    Activity Tolerance  Patient tolerated treatment well;No increased pain    Behavior During Therapy  WFL for tasks assessed/performed       Past Medical History:  Diagnosis Date  . H/O bilateral breast reduction surgery   . Lymphedema of left leg   . Ovarian cyst   . S/P tonsillectomy     Past Surgical History:  Procedure Laterality Date  . BREAST REDUCTION SURGERY    . OVARIAN CYST REMOVAL    . TONSILLECTOMY      There were no vitals filed for this visit.  Subjective Assessment - 05/10/20 1412    Subjective   Mallory Johnston presents for OT visit 6/36 to address BLE lymphedema and associated pain. Pt has no new complaints. Pain is not rated. 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 LLE.    Pertinent History  H/O ovarian cyst, tonsilectomy, B breast reduction; works 2 jobs standing and walking,                   OT Treatments/Exercises (OP) - 05/10/20 0001      ADLs   ADL Education Given  Yes      Manual Therapy   Manual Therapy  Edema management;Manual Lymphatic Drainage (MLD);Compression Bandaging    Edema Management  Flexitouch trial to LLE using 30-40 mmHg.    Compression Bandaging  multilayered, LLE, foot to popliteal  compression wrap using gradient techniques w/ 8, 10 and 12 cm wide, short stretch wraps over 0.4 cm   thick Rosidal foam and stockinett. no toe wraps             OT Education - 05/10/20 1419    Education Details  Pt education for advanced Flexitouch sequential pneumatic compression device ("pump"), including clinical rational, precautions and contraindications, care and, use schedule, and donning and doffing device garments. Pt completed 45 minute trial using 30-40 mmHg and patient's questions were answered throughout trial.    Person(s) Educated  Patient    Methods  Explanation;Demonstration;Handout    Comprehension  Verbalized understanding;Returned demonstration;Need further instruction          OT Long Term Goals - 04/03/20 1334      OT LONG TERM GOAL #1   Title  Pt will be able to verbalize signs and symptoms of cellulitis infection , and be able to name 6 common lymphedema precautions using printed resource for reference (modified independence)  to limit LE progression over time .    Baseline  Max A    Time  5    Period  Days    Status  New    Target Date  07/01/20   5th OT visit     OT LONG TERM GOAL #3   Title  Pt will be able to apply BLE, knee length, multi-layer, short stretch  compression wraps using correct gradient techniques with modified independence (extra time) to achieve optimal limb volume reduction, and to return affected limb , as closely as possible, to premorbid size and shape.    Baseline  Max A    Time  4    Period  Days    Status  New    Target Date  --   5th OT visit     OT LONG TERM GOAL #4   Title  Pt will achieve at least a 10% BLE limb volume reductions below knees ( ankle- tibial tuberosity A-D) during Intensive Phase CDT to improve AROM at LE joints for functional activities, ambulation and transfers, to reduce infection risk, and to limit LE progression.    Baseline  Max A    Time  12    Period  Weeks    Status  New    Target Date  07/01/20      OT LONG TERM GOAL #5   Title  Pt will achieve and sustain 85% compliance with daily LE  self-care home program components throughout Intensive Phase CDT, including impeccable skin care, lymphatic pumping therex, daily sequential pneumatic pump (one leg per day on alternating legs) and custom compression to ensure optimal limb volume reduction, to limit infection risk and to limit LE progression.    Baseline  Max A    Time  12    Period  Weeks    Status  New    Target Date  07/02/20      Long Term Additional Goals   Additional Long Term Goals  Yes      OT LONG TERM GOAL #6   Title  Pt will demonstrate modified independence using assistive devices and extra time  during LE self-care training to don and doff properly fitting, custom compression garments/devices with Max A for optimal LE self-management over time.    Baseline  Max A    Time  12    Period  Days    Status  New    Target Date  07/02/20            Plan - 05/10/20 1433    Clinical Impression Statement  Mallory Johnston presents with bilateral lower extremity lymphedema tarda. Mallory Johnston has attempted to reduce lymphedema using in a basic sequential pneumatic device [phone]), off the shelf compression garments, and most recently with a course of Intensive Phase Complete Decongestive Therapy. Despite these efforts symptoms of progressive lymphedema persist. Pt tolerated full 1 hr. Flexitouch trial utilizing 30-40 mmHg compression to LLE without increased pain and without shortness of breath. Positive indentations below knee are observed at end of the treatment session when device was removed indicating fluid and tissue protein are malleable by the device.  The 32-chamber Flexitouch is the only available sequential pneumatic compression device providing proximal to distal lymphatic fluid return via regional lymph nodes (LN) deep abdominal pathways and the thoracic duct to the heart. The basic pneumatic pump is not appropriate for this patient because it provides distal-to-proximal, retrograde massage mobilizing tissue fluid  against back pressure which then abruptly ends before reaching regional LNs leaving dense, protein rich fluid distal to the lymph nodes at joints increasing pain.    OT Occupational Profile and History  Comprehensive Assessment- Review of records and extensive additional review of physical, cognitive, psychosocial history related to current functional performance    Occupational performance deficits (Please refer to evaluation for details):  ADL's;IADL's;Work;Leisure;Social Participation;Other   body image  Body Structure / Function / Physical Skills  ADL;Decreased knowledge of precautions;Mobility;Decreased knowledge of use of DME;Edema;Skin integrity;Pain;IADL    Rehab Potential  Good    Clinical Decision Making  Several treatment options, min-mod task modification necessary    Comorbidities Affecting Occupational Performance:  May have comorbidities impacting occupational performance    Modification or Assistance to Complete Evaluation   No modification of tasks or assist necessary to complete eval    OT Frequency  2x / week    OT Duration  12 weeks    OT Treatment/Interventions  Self-care/ADL training;Therapeutic exercise;Manual lymph drainage;Patient/family education;Compression bandaging;Building services engineer;Therapeutic activities;Manual Therapy;DME and/or AE instruction    Plan  Complete Decomgestive Therapy, Intensive and Self-Manageent Phase support: Manual lymphatic drainage (MLD) skin care, ther ex, compression wraps and garments. Consider    Recommended Other Services  Fit Pt with BLE, custom, flat knit Jobst Elvarex compression knee highs. Consider Elvarex SOFT, ccl 3 (34-46 mmHg). Consider Jobst RELAX HOS device to limit fibrosis formation. Fit with Flexitouch advanced sequential pneumatic compression device ("PUMP") to aid in daily LE self care at home over time    Consulted and Agree with Plan of Care  Patient       Patient will benefit from skilled therapeutic intervention  in order to improve the following deficits and impairments:   Body Structure / Function / Physical Skills: ADL, Decreased knowledge of precautions, Mobility, Decreased knowledge of use of DME, Edema, Skin integrity, Pain, IADL       Visit Diagnosis: Lymphedema, not elsewhere classified  Primary (congenital) lymphedema    Problem List Patient Active Problem List   Diagnosis Date Noted  . Swelling of limb 11/29/2019  . Lymphedema 11/29/2019  . Severe recurrent major depression without psychotic features (HCC) 03/07/2019  . Suicidal ideation 03/06/2019  . Noncompliance 03/06/2019  . Class 1 obesity due to excess calories without serious comorbidity with body mass index (BMI) of 30.0 to 30.9 in adult 02/11/2017  . History of cervical dysplasia 02/11/2017  . Liver lesion, right lobe 10/17/2016  . Von Willebrand's disease (HCC) 11/10/2015  . PCOS (polycystic ovarian syndrome) 09/20/2015    Loel Dubonnet, MS, OTR/L, [facility] 05/10/20 2:59 PM  [facility] MAIN [facility] SERVICES [address] [place], [place], [address] Phone: [phone]   Fax:  [phone]  Name: ZADAYA CUADRA MRN: [id number] Date of Birth: 09-24-1991

## 2020-05-14 ENCOUNTER — Ambulatory Visit: Payer: BC Managed Care – PPO | Admitting: Occupational Therapy

## 2020-05-14 ENCOUNTER — Other Ambulatory Visit: Payer: Self-pay

## 2020-05-14 ENCOUNTER — Telehealth: Payer: Self-pay | Admitting: Internal Medicine

## 2020-05-14 DIAGNOSIS — I890 Lymphedema, not elsewhere classified: Secondary | ICD-10-CM | POA: Diagnosis not present

## 2020-05-14 NOTE — Therapy (Signed)
[place] [facility] MAIN [facility] SERVICES [address] [place], [place], [address] Phone: [phone]   Fax:  [phone]  Occupational Therapy Treatment  Patient Details  Name: Mallory Johnston MRN: [id number] Date of Birth: 16-Mar-1991 Referring Provider (OT): Sheppard Plumber, NP   Encounter Date: 05/14/2020  OT End of Session - 05/14/20 0814    Visit Number  8    Number of Visits  36    Date for OT Re-Evaluation  07/01/20    OT Start Time  0802    OT Stop Time  0902    OT Time Calculation (min)  60 min    Activity Tolerance  Patient tolerated treatment well;No increased pain    Behavior During Therapy  WFL for tasks assessed/performed       Past Medical History:  Diagnosis Date  . H/O bilateral breast reduction surgery   . Lymphedema of left leg   . Ovarian cyst   . S/P tonsillectomy     Past Surgical History:  Procedure Laterality Date  . BREAST REDUCTION SURGERY    . OVARIAN CYST REMOVAL    . TONSILLECTOMY      There were no vitals filed for this visit.  Subjective Assessment - 05/14/20 0815    Subjective   Mallory Johnston presents for OT visit 8/36 to address BLE lymphedema and associated pain. Pt has no new complaints. Pt denies leg pain. "I think having off on a Sunday was nice. I took a break."    Pertinent History  H/O ovarian cyst, tonsilectomy, B breast reduction; works 2 jobs standing and walking,                   OT Treatments/Exercises (OP) - 05/14/20 0001      ADLs   ADL Education Given  Yes      Manual Therapy   Manual Therapy  Edema management;Manual Lymphatic Drainage (MLD);Compression Bandaging    Manual Lymphatic Drainage (MLD)  LLE MLD utilizing shrt neck sequence, deep abdominal pathway , functionall inguinal LN and UJ strokes to LLE from proximal to distal.    Compression Bandaging  Added custom fabricated "cigarette foam" dorsal foot pad in effort t reduce stubborn fatty fibrosis  in this area.  multilayered, LLE, foot to popliteal  compression wrap using gradient techniques w/ 8, 10 and 12 cm wide, short stretch wraps over 0.4 cm  thick Rosidal foam and stockinett. no toe wraps             OT Education - 05/14/20 0937    Education Details  Continued skilled Pt/caregiver education  And LE ADL training throughout visit for lymphedema self care/ home program, including compression wrapping, compression garment and device wear/care, lymphatic pumping ther ex, simple self-MLD, and skin care. Discussed progress towards goals.    Person(s) Educated  Patient    Methods  Explanation;Demonstration;Handout    Comprehension  Verbalized understanding;Returned demonstration;Need further instruction          OT Long Term Goals - 04/03/20 1334      OT LONG TERM GOAL #1   Title  Pt will be able to verbalize signs and symptoms of cellulitis infection , and be able to name 6 common lymphedema precautions using printed resource for reference (modified independence)  to limit LE progression over time .    Baseline  Max A    Time  5    Period  Days    Status  New  Target Date  07/01/20   5th OT visit     OT LONG TERM GOAL #3   Title  Pt will be able to apply BLE, knee length, multi-layer, short stretch compression wraps using correct gradient techniques with modified independence (extra time) to achieve optimal limb volume reduction, and to return affected limb , as closely as possible, to premorbid size and shape.    Baseline  Max A    Time  4    Period  Days    Status  New    Target Date  --   5th OT visit     OT LONG TERM GOAL #4   Title  Pt will achieve at least a 10% BLE limb volume reductions below knees ( ankle- tibial tuberosity A-D) during Intensive Phase CDT to improve AROM at LE joints for functional activities, ambulation and transfers, to reduce infection risk, and to limit LE progression.    Baseline  Max A    Time  12    Period  Weeks    Status  New    Target Date   07/01/20      OT LONG TERM GOAL #5   Title  Pt will achieve and sustain 85% compliance with daily LE self-care home program components throughout Intensive Phase CDT, including impeccable skin care, lymphatic pumping therex, daily sequential pneumatic pump (one leg per day on alternating legs) and custom compression to ensure optimal limb volume reduction, to limit infection risk and to limit LE progression.    Baseline  Max A    Time  12    Period  Weeks    Status  New    Target Date  07/02/20      Long Term Additional Goals   Additional Long Term Goals  Yes      OT LONG TERM GOAL #6   Title  Pt will demonstrate modified independence using assistive devices and extra time  during LE self-care training to don and doff properly fitting, custom compression garments/devices with Max A for optimal LE self-management over time.    Baseline  Max A    Time  12    Period  Days    Status  New    Target Date  07/02/20            Plan - 05/14/20 0937    Clinical Impression Statement  LLE swelling is well banaged during visit interval. Stubbornfatty fibrosis at dorsal L foot persists. Added custom fabricated [employer] "cigarette oam" doral pad in effort to reduce dorsal foot       fibrosis by creating areas of high and low pressure to encourage decongestion vs stagnation. Pt able to add addition visit this week for 2 total. Cont as per POC. Complete limb volumetrics next session and review goals w/ Pt.    OT Occupational Profile and History  Comprehensive Assessment- Review of records and extensive additional review of physical, cognitive, psychosocial history related to current functional performance    Occupational performance deficits (Please refer to evaluation for details):  ADL's;IADL's;Work;Leisure;Social Participation;Other   body image   Body Structure / Function / Physical Skills  ADL;Decreased knowledge of precautions;Mobility;Decreased knowledge of use of DME;Edema;Skin  integrity;Pain;IADL    Rehab Potential  Good    Clinical Decision Making  Several treatment options, min-mod task modification necessary    Comorbidities Affecting Occupational Performance:  May have comorbidities impacting occupational performance    Modification or Assistance to Complete Evaluation   No  modification of tasks or assist necessary to complete eval    OT Frequency  2x / week    OT Duration  12 weeks    OT Treatment/Interventions  Self-care/ADL training;Therapeutic exercise;Manual lymph drainage;Patient/family education;Compression bandaging;Building services engineer;Therapeutic activities;Manual Therapy;DME and/or AE instruction    Plan  Complete Decomgestive Therapy, Intensive and Self-Manageent Phase support: Manual lymphatic drainage (MLD) skin care, ther ex, compression wraps and garments. Consider    Recommended Other Services  Fit Pt with BLE, custom, flat knit Jobst Elvarex compression knee highs. Consider Elvarex SOFT, ccl 3 (34-46 mmHg). Consider Jobst RELAX HOS device to limit fibrosis formation. Fit with Flexitouch advanced sequential pneumatic compression device ("PUMP") to aid in daily LE self care at home over time    Consulted and Agree with Plan of Care  Patient       Patient will benefit from skilled therapeutic intervention in order to improve the following deficits and impairments:   Body Structure / Function / Physical Skills: ADL, Decreased knowledge of precautions, Mobility, Decreased knowledge of use of DME, Edema, Skin integrity, Pain, IADL       Visit Diagnosis: Lymphedema, not elsewhere classified    Problem List Patient Active Problem List   Diagnosis Date Noted  . Swelling of limb 11/29/2019  . Lymphedema 11/29/2019  . Severe recurrent major depression without psychotic features (HCC) 03/07/2019  . Suicidal ideation 03/06/2019  . Noncompliance 03/06/2019  . Class 1 obesity due to excess calories without serious comorbidity with body mass  index (BMI) of 30.0 to 30.9 in adult 02/11/2017  . History of cervical dysplasia 02/11/2017  . Liver lesion, right lobe 10/17/2016  . Von Willebrand's disease (HCC) 11/10/2015  . PCOS (polycystic ovarian syndrome) 09/20/2015    Loel Dubonnet, MS, OTR/L, [facility] 05/14/20 9:41 AM  [place] [facility] MAIN [facility] SERVICES [address] [place], [place], [address] Phone: [phone]   Fax:  [phone]  Name: Mallory Johnston MRN: [id number] Date of Birth: 1991-02-17

## 2020-05-14 NOTE — Telephone Encounter (Signed)
Received Fax this morning. Dr Judithann Graves said she has never prescribed this for the pt before. She woul dneed to get this from vascular surgery.  Called and left a MSG for Lupita Leash at [employer] informing her of this information.  CM

## 2020-05-14 NOTE — Telephone Encounter (Signed)
Copied from CRM [id number]. Topic: General - Inquiry >> May 14, 2020 10:33 AM Deborha Payment wrote: Reason for CRM: Lupita Leash from [employer] called regarding a fax regarding a compression device for patient.  Lupita Leash needs PCP to sign off on prescription.   Call back [phone]

## 2020-05-15 ENCOUNTER — Encounter (INDEPENDENT_AMBULATORY_CARE_PROVIDER_SITE_OTHER): Payer: 59

## 2020-05-18 ENCOUNTER — Encounter: Payer: BC Managed Care – PPO | Admitting: Occupational Therapy

## 2020-05-22 ENCOUNTER — Encounter (INDEPENDENT_AMBULATORY_CARE_PROVIDER_SITE_OTHER): Payer: 59

## 2020-05-24 ENCOUNTER — Ambulatory Visit: Payer: BC Managed Care – PPO | Admitting: Occupational Therapy

## 2020-05-24 ENCOUNTER — Other Ambulatory Visit: Payer: Self-pay

## 2020-05-24 DIAGNOSIS — I890 Lymphedema, not elsewhere classified: Secondary | ICD-10-CM

## 2020-05-24 NOTE — Therapy (Signed)
[place] [facility] MAIN [facility] SERVICES [address] [place], [place], [address] Phone: [phone]   Fax:  [phone]  Occupational Therapy Treatment  Patient Details  Name: Mallory Johnston MRN: [id number] Date of Birth: 09-03-1991 Referring Provider (OT): Sheppard Plumber, NP   Encounter Date: 05/24/2020  OT End of Session - 05/24/20 0912    Visit Number  9    Number of Visits  36    Date for OT Re-Evaluation  07/01/20    OT Start Time  0808    OT Stop Time  0908    OT Time Calculation (min)  60 min    Activity Tolerance  Patient tolerated treatment well;No increased pain    Behavior During Therapy  WFL for tasks assessed/performed       Past Medical History:  Diagnosis Date  . H/O bilateral breast reduction surgery   . Lymphedema of left leg   . Ovarian cyst   . S/P tonsillectomy     Past Surgical History:  Procedure Laterality Date  . BREAST REDUCTION SURGERY    . OVARIAN CYST REMOVAL    . TONSILLECTOMY      There were no vitals filed for this visit.  Subjective Assessment - 05/24/20 0909    Subjective   Mallory Johnston presents for OT visit 9/36 to address BLE lymphedema and associated pain. Pt has no new complaints. Leg pain is unchanged since last visit and is not rated numerically. Pt reports she's moving to a new apartment today and over the weekend.    Pertinent History  H/O ovarian cyst, tonsilectomy, B breast reduction; works 2 jobs standing and walking,    Currently in Pain?  Yes    Pain Location  Leg    Pain Orientation  Right;Left    Pain Type  Chronic pain                   OT Treatments/Exercises (OP) - 05/24/20 0001      ADLs   ADL Education Given  Yes      Manual Therapy   Manual Therapy  Edema management;Manual Lymphatic Drainage (MLD);Compression Bandaging    Manual Lymphatic Drainage (MLD)  LLE MLD utilizing shrt neck sequence, deep abdominal pathway , functionall inguinal LN and UJ strokes to LLE  from proximal to distal.    Compression Bandaging  Continued with custom fabricated "cigarette foam" dorsal foot pad in effort t reduce stubborn fatty fibrosis  in this area. multilayered, LLE, foot to popliteal  compression wrap using gradient techniques w/ 8, 10 and 12 cm wide, short stretch wraps over 0.4 cm  thick Rosidal foam and stockinett. no toe wraps             OT Education - 05/24/20 0911    Education Details  Continued skilled Pt/caregiver education  And LE ADL training throughout visit for lymphedema self care/ home program, including compression wrapping, compression garment and device wear/care, lymphatic pumping ther ex, simple self-MLD, and skin care. Discussed progress towards goals.    Person(s) Educated  Patient    Methods  Explanation;Demonstration;Handout    Comprehension  Verbalized understanding;Returned demonstration;Need further instruction          OT Long Term Goals - 04/03/20 1334      OT LONG TERM GOAL #1   Title  Pt will be able to verbalize signs and symptoms of cellulitis infection , and be able to name 6 common lymphedema precautions using printed resource for  reference (modified independence)  to limit LE progression over time .    Baseline  Max A    Time  5    Period  Days    Status  New    Target Date  07/01/20   5th OT visit     OT LONG TERM GOAL #3   Title  Pt will be able to apply BLE, knee length, multi-layer, short stretch compression wraps using correct gradient techniques with modified independence (extra time) to achieve optimal limb volume reduction, and to return affected limb , as closely as possible, to premorbid size and shape.    Baseline  Max A    Time  4    Period  Days    Status  New    Target Date  --   5th OT visit     OT LONG TERM GOAL #4   Title  Pt will achieve at least a 10% BLE limb volume reductions below knees ( ankle- tibial tuberosity A-D) during Intensive Phase CDT to improve AROM at LE joints for functional  activities, ambulation and transfers, to reduce infection risk, and to limit LE progression.    Baseline  Max A    Time  12    Period  Weeks    Status  New    Target Date  07/01/20      OT LONG TERM GOAL #5   Title  Pt will achieve and sustain 85% compliance with daily LE self-care home program components throughout Intensive Phase CDT, including impeccable skin care, lymphatic pumping therex, daily sequential pneumatic pump (one leg per day on alternating legs) and custom compression to ensure optimal limb volume reduction, to limit infection risk and to limit LE progression.    Baseline  Max A    Time  12    Period  Weeks    Status  New    Target Date  07/02/20      Long Term Additional Goals   Additional Long Term Goals  Yes      OT LONG TERM GOAL #6   Title  Pt will demonstrate modified independence using assistive devices and extra time  during LE self-care training to don and doff properly fitting, custom compression garments/devices with Max A for optimal LE self-management over time.    Baseline  Max A    Time  12    Period  Days    Status  New    Target Date  07/02/20            Plan - 05/24/20 0912    Clinical Impression Statement  Pt presents with compression wraps in place as directed. Limb swelling is well managed this morning and wrapping techniques are correct. Pt demos good compliance w/ LE self care for skin care, compression bandaging so far. MLD, skin care and knee length compression wrappingto LLE well tolerated today without increased pain. Cont as per OC.    OT Occupational Profile and History  Comprehensive Assessment- Review of records and extensive additional review of physical, cognitive, psychosocial history related to current functional performance    Occupational performance deficits (Please refer to evaluation for details):  ADL's;IADL's;Work;Leisure;Social Participation;Other   body image   Body Structure / Function / Physical Skills  ADL;Decreased  knowledge of precautions;Mobility;Decreased knowledge of use of DME;Edema;Skin integrity;Pain;IADL    Rehab Potential  Good    Clinical Decision Making  Several treatment options, min-mod task modification necessary    Comorbidities Affecting Occupational  Performance:  May have comorbidities impacting occupational performance    Modification or Assistance to Complete Evaluation   No modification of tasks or assist necessary to complete eval    OT Frequency  2x / week    OT Duration  12 weeks    OT Treatment/Interventions  Self-care/ADL training;Therapeutic exercise;Manual lymph drainage;Patient/family education;Compression bandaging;Therapist, nutritional;Therapeutic activities;Manual Therapy;DME and/or AE instruction    Plan  Complete Decomgestive Therapy, Intensive and Self-Manageent Phase support: Manual lymphatic drainage (MLD) skin care, ther ex, compression wraps and garments. Consider    Recommended Other Services  Fit Pt with BLE, custom, flat knit Jobst Elvarex compression knee highs. Consider Elvarex SOFT, ccl 3 (34-46 mmHg). Consider Jobst RELAX HOS device to limit fibrosis formation. Fit with Flexitouch advanced sequential pneumatic compression device ("PUMP") to aid in daily LE self care at home over time    Consulted and Agree with Plan of Care  Patient       Patient will benefit from skilled therapeutic intervention in order to improve the following deficits and impairments:   Body Structure / Function / Physical Skills: ADL, Decreased knowledge of precautions, Mobility, Decreased knowledge of use of DME, Edema, Skin integrity, Pain, IADL       Visit Diagnosis: Lymphedema, not elsewhere classified    Problem List Patient Active Problem List   Diagnosis Date Noted  . Swelling of limb 11/29/2019  . Lymphedema 11/29/2019  . Severe recurrent major depression without psychotic features ([place]) 03/07/2019  . Suicidal ideation 03/06/2019  . Noncompliance 03/06/2019  .  Class 1 obesity due to excess calories without serious comorbidity with body mass index (BMI) of 30.0 to 30.9 in adult 02/11/2017  . History of cervical dysplasia 02/11/2017  . Liver lesion, right lobe 10/17/2016  . Von Willebrand's disease ([place]) 11/10/2015  . PCOS (polycystic ovarian syndrome) 09/20/2015   Andrey Spearman, MS, OTR/L, [facility] 05/24/20 9:13 AM   [place] MAIN [facility] SERVICES [place], [place], [address] Phone: [phone]   Fax:  [phone]  Name: Mallory Johnston MRN: [id number] Date of Birth: 02-08-1991

## 2020-05-29 ENCOUNTER — Encounter: Payer: BC Managed Care – PPO | Admitting: Occupational Therapy

## 2020-05-29 ENCOUNTER — Encounter (INDEPENDENT_AMBULATORY_CARE_PROVIDER_SITE_OTHER): Payer: 59

## 2020-05-31 ENCOUNTER — Encounter: Payer: BC Managed Care – PPO | Admitting: Occupational Therapy

## 2020-06-05 ENCOUNTER — Ambulatory Visit (INDEPENDENT_AMBULATORY_CARE_PROVIDER_SITE_OTHER): Payer: 59 | Admitting: Nurse Practitioner

## 2020-06-07 ENCOUNTER — Other Ambulatory Visit: Payer: Self-pay

## 2020-06-07 ENCOUNTER — Ambulatory Visit: Payer: BC Managed Care – PPO | Attending: Nurse Practitioner | Admitting: Occupational Therapy

## 2020-06-07 DIAGNOSIS — I890 Lymphedema, not elsewhere classified: Secondary | ICD-10-CM | POA: Diagnosis present

## 2020-06-07 NOTE — Therapy (Signed)
[facility] MAIN [facility] SERVICES [address] [place], [place], [address] Phone: [phone]   Fax:  [phone]  Occupational Therapy Treatment  Patient Details  Name: Mallory Johnston MRN: [id number] Date of Birth: July 15, 1991 Referring Provider (OT): Sheppard Plumber, NP   Encounter Date: 06/07/2020   OT End of Session - 06/07/20 0918    Visit Number 10    Number of Visits 36    Date for OT Re-Evaluation 07/01/20    OT Start Time 0800    OT Stop Time 0905    OT Time Calculation (min) 65 min    Activity Tolerance Patient tolerated treatment well;No increased pain    Behavior During Therapy WFL for tasks assessed/performed           Past Medical History:  Diagnosis Date  . H/O bilateral breast reduction surgery   . Lymphedema of left leg   . Ovarian cyst   . S/P tonsillectomy     Past Surgical History:  Procedure Laterality Date  . BREAST REDUCTION SURGERY    . OVARIAN CYST REMOVAL    . TONSILLECTOMY      There were no vitals filed for this visit.   Subjective Assessment - 06/07/20 0809    Subjective  Mallory Johnston presents for OT visit 10/36 to address BLE lymphedema and associated pain. Pt reports she has had increased foot swelling. Pt reports 7/10 pain in L>R feet. "It's aching."    Pertinent History H/O ovarian cyst, tonsilectomy, B breast reduction; works 2 jobs standing and walking,               LYMPHEDEMA/ONCOLOGY QUESTIONNAIRE - 06/07/20 0001      Left Lower Extremity Lymphedema   Other LLE ankle to politeal limb volume = 4424.98 ml.    Other LLE A-D volume is decreased 8.29% since initally measured  on 04/09/20.                   OT Treatments/Exercises (OP) - 06/07/20 0001      ADLs   ADL Education Given Yes      Manual Therapy   Manual Therapy Edema management;Compression Bandaging;Manual Lymphatic Drainage (MLD)    Manual therapy comments LLE comparative limb volumetrics    Edema Management  anatomical measurements for LLE custom knee length compression garment. will complete toe cap measurements next session    Compression Bandaging LLE knee length ccompression wrap as established                  OT Education - 06/07/20 1244    Education Details Continued skilled Pt/caregiver education  And LE ADL training throughout visit for lymphedema self care/ home program, including compression wrapping, compression garment and device wear/care, lymphatic pumping ther ex, simple self-MLD, and skin care. Discussed progress towards goals.    Person(s) Educated Patient    Methods Explanation;Demonstration;Handout    Comprehension Verbalized understanding;Returned demonstration;Need further instruction               OT Long Term Goals - 06/07/20 0800      OT LONG TERM GOAL #1   Title Pt will be able to verbalize signs and symptoms of cellulitis infection , and be able to name 6 common lymphedema precautions using printed resource for reference (modified independence)  to limit LE progression over time .    Baseline Max A    Time 5    Period Days    Status Achieved  OT LONG TERM GOAL #2   Title Pt will retain volumetric reduction achieved during Intensive Phase CDT for 6 months after transitioning to Self Management Phase as evidenced by no greater than 3% limb volume increase when measured for follow up.    Baseline Min A    Time 6    Period Months    Status New    Target Date 12/04/20      OT LONG TERM GOAL #3   Title Pt will be able to apply BLE, knee length, multi-layer, short stretch compression wraps using correct gradient techniques with modified independence (extra time) to achieve optimal limb volume reduction, and to return affected limb , as closely as possible, to premorbid size and shape.    Baseline Max A    Time 4    Period Days    Status Achieved      OT LONG TERM GOAL #4   Title Pt will achieve at least a 10% BLE limb volume reductions below  knees ( ankle- tibial tuberosity A-D) during Intensive Phase CDT to improve AROM at LE joints for functional activities, ambulation and transfers, to reduce infection risk, and to limit LE progression.    Baseline Max A    Time 12    Period Weeks    Status On-going   achieved 8.29% decrease on 10th visit 06/07/20     OT LONG TERM GOAL #5   Title Pt will achieve and sustain 85% compliance with daily LE self-care home program components throughout Intensive Phase CDT, including impeccable skin care, lymphatic pumping therex, daily sequential pneumatic pump (one leg per day on alternating legs) and custom compression to ensure optimal limb volume reduction, to limit infection risk and to limit LE progression.    Baseline Max A    Time 12    Period Weeks    Status Achieved      OT LONG TERM GOAL #6   Title Pt will demonstrate modified independence using assistive devices and extra time  during LE self-care training to don and doff properly fitting, custom compression garments/devices with Max A for optimal LE self-management over time.    Baseline Max A    Time 12    Period Days    Status On-going                 Plan - 06/07/20 1244    Clinical Impression Statement Re viewed progress to date towards OT goals for LE care. LLE A-D volume is decreased 8.29% since initally measured  on 04/09/20.This value appraches 10% reduction goal.Pt demonstrates excellent compliance w/ LE self care between visits. Despite excellent progress, L foot remains mildly swollen with generalized fibrosis. 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 Pain in leg is resolved. Completed measurements for custom, flat knit, Jobst , Elvarex knee high compression garment this visit. Will complete toe cap measurements and discuss plan for RLE garments next session.    Occupational performance deficits (Please refer  to evaluation for details): ADL's;IADL's;Work;Leisure;Social Participation;Other    Body Structure / Function / Physical Skills ADL;Decreased knowledge of precautions;Mobility;Decreased knowledge of use of DME;Edema;Skin integrity;Pain;IADL    Rehab Potential Good    Clinical Decision Making Several treatment options, min-mod task modification necessary    Comorbidities Affecting Occupational Performance: May have comorbidities impacting occupational performance    Modification or Assistance to Complete Evaluation  No modification  of tasks or assist necessary to complete eval    OT Treatment/Interventions Self-care/ADL training;Therapeutic exercise;Manual lymph drainage;Patient/family education;Compression bandaging;Therapist, nutritional;Therapeutic activities;Manual Therapy;DME and/or AE instruction    Plan Complete Decomgestive Therapy, Intensive and Self-Manageent Phase support: Manual lymphatic drainage (MLD) skin care, ther ex, compression wraps and garments. Consider    Recommended Other Services Fit Pt with BLE, custom, flat knit Jobst Elvarex compression knee highs. Consider Elvarex SOFT, ccl 3 (34-46 mmHg). Consider Jobst RELAX HOS device to limit fibrosis formation. Fit with Flexitouch advanced sequential pneumatic compression device ("PUMP") to aid in daily LE self care at home over time           Patient will benefit from skilled therapeutic intervention in order to improve the following deficits and impairments:   Body Structure / Function / Physical Skills: ADL, Decreased knowledge of precautions, Mobility, Decreased knowledge of use of DME, Edema, Skin integrity, Pain, IADL       Visit Diagnosis: Lymphedema, not elsewhere classified    Problem List Patient Active Problem List   Diagnosis Date Noted  . Swelling of limb 11/29/2019  . Lymphedema 11/29/2019  . Severe recurrent major depression without psychotic features ([place]) 03/07/2019  . Suicidal ideation 03/06/2019   . Noncompliance 03/06/2019  . Class 1 obesity due to excess calories without serious comorbidity with body mass index (BMI) of 30.0 to 30.9 in adult 02/11/2017  . History of cervical dysplasia 02/11/2017  . Liver lesion, right lobe 10/17/2016  . Von Willebrand's disease ([place]) 11/10/2015  . PCOS (polycystic ovarian syndrome) 09/20/2015    Andrey Spearman, MS, OTR/L, [facility] 06/07/20 12:55 PM   [place] MAIN [facility] SERVICES [address] [place], [place], [address] Phone: [phone]   Fax:  [phone]  Name: ANAHITA CUA MRN: [id number] Date of Birth: March 01, 1991

## 2020-06-11 ENCOUNTER — Ambulatory Visit: Payer: BC Managed Care – PPO | Admitting: Occupational Therapy

## 2020-06-11 ENCOUNTER — Other Ambulatory Visit: Payer: Self-pay

## 2020-06-11 DIAGNOSIS — I890 Lymphedema, not elsewhere classified: Secondary | ICD-10-CM | POA: Diagnosis not present

## 2020-06-11 NOTE — Therapy (Signed)
[place] [facility] MAIN [facility] SERVICES [address] [place], [place], [address] Phone: [phone]   Fax:  [phone]  Occupational Therapy Treatment  Patient Details  Name: Mallory Johnston MRN: [id number] Date of Birth: 1991/06/17 Referring Provider (OT): Sheppard Plumber, NP   Encounter Date: 06/11/2020   OT End of Session - 06/11/20 0923    Visit Number 11    Number of Visits 36    Date for OT Re-Evaluation 07/01/20    OT Start Time 0803    OT Stop Time 0853    OT Time Calculation (min) 50 min    Activity Tolerance Patient tolerated treatment well;No increased pain    Behavior During Therapy WFL for tasks assessed/performed           Past Medical History:  Diagnosis Date  . H/O bilateral breast reduction surgery   . Lymphedema of left leg   . Ovarian cyst   . S/P tonsillectomy     Past Surgical History:  Procedure Laterality Date  . BREAST REDUCTION SURGERY    . OVARIAN CYST REMOVAL    . TONSILLECTOMY      There were no vitals filed for this visit.   Subjective Assessment - 06/11/20 4854    Subjective  Mallory Johnston presents for OT visit 11/36 to address BLE lymphedema and associated pain. Pt denies leg pain this morning. She has no new complaints.    Pertinent History H/O ovarian cyst, tonsilectomy, B breast reduction; works 2 jobs standing and walking,                        OT Treatments/Exercises (OP) - 06/11/20 0001      ADLs   ADL Education Given Yes      Manual Therapy   Manual Therapy Edema management;Compression Bandaging    Edema Management anatomical measurements for LLE custom toe cap    Compression Bandaging LLE knee length ccompression wrap as established                  OT Education - 06/11/20 0923    Education Details Continued skilled Pt/caregiver education  And LE ADL training throughout visit for lymphedema self care/ home program, including compression wrapping, compression garment  and device wear/care, lymphatic pumping ther ex, simple self-MLD, and skin care. Discussed progress towards goals.    Person(s) Educated Patient    Methods Explanation;Demonstration;Handout    Comprehension Verbalized understanding;Returned demonstration;Need further instruction               OT Long Term Goals - 06/07/20 0800      OT LONG TERM GOAL #1   Title Pt will be able to verbalize signs and symptoms of cellulitis infection , and be able to name 6 common lymphedema precautions using printed resource for reference (modified independence)  to limit LE progression over time .    Baseline Max A    Time 5    Period Days    Status Achieved      OT LONG TERM GOAL #2   Title Pt will retain volumetric reduction achieved during Intensive Phase CDT for 6 months after transitioning to Self Management Phase as evidenced by no greater than 3% limb volume increase when measured for follow up.    Baseline Min A    Time 6    Period Months    Status New    Target Date 12/04/20      OT LONG  TERM GOAL #3   Title Pt will be able to apply BLE, knee length, multi-layer, short stretch compression wraps using correct gradient techniques with modified independence (extra time) to achieve optimal limb volume reduction, and to return affected limb , as closely as possible, to premorbid size and shape.    Baseline Max A    Time 4    Period Days    Status Achieved      OT LONG TERM GOAL #4   Title Pt will achieve at least a 10% BLE limb volume reductions below knees ( ankle- tibial tuberosity A-D) during Intensive Phase CDT to improve AROM at LE joints for functional activities, ambulation and transfers, to reduce infection risk, and to limit LE progression.    Baseline Max A    Time 12    Period Weeks    Status On-going   achieved 8.29% decrease on 10th visit 06/07/20     OT LONG TERM GOAL #5   Title Pt will achieve and sustain 85% compliance with daily LE self-care home program components  throughout Intensive Phase CDT, including impeccable skin care, lymphatic pumping therex, daily sequential pneumatic pump (one leg per day on alternating legs) and custom compression to ensure optimal limb volume reduction, to limit infection risk and to limit LE progression.    Baseline Max A    Time 12    Period Weeks    Status Achieved      OT LONG TERM GOAL #6   Title Pt will demonstrate modified independence using assistive devices and extra time  during LE self-care training to don and doff properly fitting, custom compression garments/devices with Max A for optimal LE self-management over time.    Baseline Max A    Time 12    Period Days    Status On-going                 Plan - 06/11/20 0924    Clinical Impression Statement Continued Pt edu re compression garment options and recommendations . Completed anatomical measurements for LLE custom toe caps and knee highs. Applied LLE knee length gradient compression wrap. Good tolerance for all aspects of LE care today. Cont as per POC.    Occupational performance deficits (Please refer to evaluation for details): ADL's;IADL's;Work;Leisure;Social Participation;Other    Body Structure / Function / Physical Skills ADL;Decreased knowledge of precautions;Mobility;Decreased knowledge of use of DME;Edema;Skin integrity;Pain;IADL    Rehab Potential Good    Clinical Decision Making Several treatment options, min-mod task modification necessary    Comorbidities Affecting Occupational Performance: May have comorbidities impacting occupational performance    Modification or Assistance to Complete Evaluation  No modification of tasks or assist necessary to complete eval    OT Treatment/Interventions Self-care/ADL training;Therapeutic exercise;Manual lymph drainage;Patient/family education;Compression bandaging;Therapist, nutritional;Therapeutic activities;Manual Therapy;DME and/or AE instruction    Plan Complete Decomgestive Therapy,  Intensive and Self-Manageent Phase support: Manual lymphatic drainage (MLD) skin care, ther ex, compression wraps and garments. Consider    Recommended Other Services Fit Pt with BLE, custom, flat knit Jobst Elvarex compression knee highs. Consider Elvarex SOFT, ccl 3 (34-46 mmHg). Consider Jobst RELAX HOS device to limit fibrosis formation. Fit with Flexitouch advanced sequential pneumatic compression device ("PUMP") to aid in daily LE self care at home over time           Patient will benefit from skilled therapeutic intervention in order to improve the following deficits and impairments:   Body Structure / Function / Physical Skills:  ADL, Decreased knowledge of precautions, Mobility, Decreased knowledge of use of DME, Edema, Skin integrity, Pain, IADL       Visit Diagnosis: Lymphedema, not elsewhere classified    Problem List Patient Active Problem List   Diagnosis Date Noted  . Swelling of limb 11/29/2019  . Lymphedema 11/29/2019  . Severe recurrent major depression without psychotic features (H[facility]) 03/07/2019  . Suicidal ideation 03/06/2019  . Noncompliance 03/06/2019  . Class 1 obesity due to excess calories without serious comorbidity with body mass index (BMI) of 30.0 to 30.9 in adult 02/11/2017  . History of cervical dysplasia 02/11/2017  . Liver lesion, right lobe 10/17/2016  . Von Willebrand's disease (H[facility]) 11/10/2015  . PCOS (polycystic ovarian syndrome) 09/20/2015    Judithann Sauger 06/11/2020, 9:41 AM  [place] [facility] MAIN [facility] SERVICES [address] [place], [place], [address] Phone: [phone]   Fax:  [phone]  Name: Mallory Johnston MRN: [id number] Date of Birth: 1991/12/12

## 2020-06-11 NOTE — Therapy (Signed)
[place] [facility] MAIN [facility] SERVICES [address] [place], [place], [address] Phone: [phone]   Fax:  [phone]  Occupational Therapy Treatment  Patient Details  Name: Mallory Johnston MRN: [id number] Date of Birth: 1991/06/17 Referring Provider (OT): Sheppard Plumber, NP   Encounter Date: 06/11/2020   OT End of Session - 06/11/20 0923    Visit Number 11    Number of Visits 36    Date for OT Re-Evaluation 07/01/20    OT Start Time 0803    OT Stop Time 0853    OT Time Calculation (min) 50 min    Activity Tolerance Patient tolerated treatment well;No increased pain    Behavior During Therapy WFL for tasks assessed/performed           Past Medical History:  Diagnosis Date  . H/O bilateral breast reduction surgery   . Lymphedema of left leg   . Ovarian cyst   . S/P tonsillectomy     Past Surgical History:  Procedure Laterality Date  . BREAST REDUCTION SURGERY    . OVARIAN CYST REMOVAL    . TONSILLECTOMY      There were no vitals filed for this visit.   Subjective Assessment - 06/11/20 4854    Subjective  Mallory Johnston presents for OT visit 11/36 to address BLE lymphedema and associated pain. Pt denies leg pain this morning. She has no new complaints.    Pertinent History H/O ovarian cyst, tonsilectomy, B breast reduction; works 2 jobs standing and walking,                        OT Treatments/Exercises (OP) - 06/11/20 0001      ADLs   ADL Education Given Yes      Manual Therapy   Manual Therapy Edema management;Compression Bandaging    Edema Management anatomical measurements for LLE custom toe cap    Compression Bandaging LLE knee length ccompression wrap as established                  OT Education - 06/11/20 0923    Education Details Continued skilled Pt/caregiver education  And LE ADL training throughout visit for lymphedema self care/ home program, including compression wrapping, compression garment  and device wear/care, lymphatic pumping ther ex, simple self-MLD, and skin care. Discussed progress towards goals.    Person(s) Educated Patient    Methods Explanation;Demonstration;Handout    Comprehension Verbalized understanding;Returned demonstration;Need further instruction               OT Long Term Goals - 06/07/20 0800      OT LONG TERM GOAL #1   Title Pt will be able to verbalize signs and symptoms of cellulitis infection , and be able to name 6 common lymphedema precautions using printed resource for reference (modified independence)  to limit LE progression over time .    Baseline Max A    Time 5    Period Days    Status Achieved      OT LONG TERM GOAL #2   Title Pt will retain volumetric reduction achieved during Intensive Phase CDT for 6 months after transitioning to Self Management Phase as evidenced by no greater than 3% limb volume increase when measured for follow up.    Baseline Min A    Time 6    Period Months    Status New    Target Date 12/04/20      OT LONG  TERM GOAL #3   Title Pt will be able to apply BLE, knee length, multi-layer, short stretch compression wraps using correct gradient techniques with modified independence (extra time) to achieve optimal limb volume reduction, and to return affected limb , as closely as possible, to premorbid size and shape.    Baseline Max A    Time 4    Period Days    Status Achieved      OT LONG TERM GOAL #4   Title Pt will achieve at least a 10% BLE limb volume reductions below knees ( ankle- tibial tuberosity A-D) during Intensive Phase CDT to improve AROM at LE joints for functional activities, ambulation and transfers, to reduce infection risk, and to limit LE progression.    Baseline Max A    Time 12    Period Weeks    Status On-going   achieved 8.29% decrease on 10th visit 06/07/20     OT LONG TERM GOAL #5   Title Pt will achieve and sustain 85% compliance with daily LE self-care home program components  throughout Intensive Phase CDT, including impeccable skin care, lymphatic pumping therex, daily sequential pneumatic pump (one leg per day on alternating legs) and custom compression to ensure optimal limb volume reduction, to limit infection risk and to limit LE progression.    Baseline Max A    Time 12    Period Weeks    Status Achieved      OT LONG TERM GOAL #6   Title Pt will demonstrate modified independence using assistive devices and extra time  during LE self-care training to don and doff properly fitting, custom compression garments/devices with Max A for optimal LE self-management over time.    Baseline Max A    Time 12    Period Days    Status On-going                 Plan - 06/11/20 0924    Clinical Impression Statement Continued Pt edu re compression garment options and recommendations . Completed anatomical measurements for LLE custom toe caps and knee highs. Applied LLE knee length gradient compression wrap. Good tolerance for all aspects of LE care today. Cont as per POC.    Occupational performance deficits (Please refer to evaluation for details): ADL's;IADL's;Work;Leisure;Social Participation;Other    Body Structure / Function / Physical Skills ADL;Decreased knowledge of precautions;Mobility;Decreased knowledge of use of DME;Edema;Skin integrity;Pain;IADL    Rehab Potential Good    Clinical Decision Making Several treatment options, min-mod task modification necessary    Comorbidities Affecting Occupational Performance: May have comorbidities impacting occupational performance    Modification or Assistance to Complete Evaluation  No modification of tasks or assist necessary to complete eval    OT Treatment/Interventions Self-care/ADL training;Therapeutic exercise;Manual lymph drainage;Patient/family education;Compression bandaging;Therapist, nutritional;Therapeutic activities;Manual Therapy;DME and/or AE instruction    Plan Complete Decomgestive Therapy,  Intensive and Self-Manageent Phase support: Manual lymphatic drainage (MLD) skin care, ther ex, compression wraps and garments. Consider    Recommended Other Services Fit Pt with BLE, custom, flat knit Jobst Elvarex compression knee highs. Consider Elvarex SOFT, ccl 3 (34-46 mmHg). Consider Jobst RELAX HOS device to limit fibrosis formation. Fit with Flexitouch advanced sequential pneumatic compression device ("PUMP") to aid in daily LE self care at home over time           Patient will benefit from skilled therapeutic intervention in order to improve the following deficits and impairments:   Body Structure / Function / Physical Skills:  ADL, Decreased knowledge of precautions, Mobility, Decreased knowledge of use of DME, Edema, Skin integrity, Pain, IADL       Visit Diagnosis: Lymphedema, not elsewhere classified    Problem List Patient Active Problem List   Diagnosis Date Noted  . Swelling of limb 11/29/2019  . Lymphedema 11/29/2019  . Severe recurrent major depression without psychotic features ([facility]CC) 03/07/2019  . Suicidal ideation 03/06/2019  . Noncompliance 03/06/2019  . Class 1 obesity due to excess calories without serious comorbidity with body mass index (BMI) of 30.0 to 30.9 in adult 02/11/2017  . History of cervical dysplasia 02/11/2017  . Liver lesion, right lobe 10/17/2016  . Von Willebrand's disease (HCC) 11/10/2015  . PCOS (polycystic ovarian syndrome) 09/20/2015    Loel Dubonnet, MS, OTR/L, [facility] 06/11/20 9:26 AM   [place] [facility] MAIN [facility] SERVICES [address] [place], [place], [address] Phone: [phone]   Fax:  [phone]  Name: Mallory Johnston MRN: [id number] Date of Birth: 26-Aug-1991

## 2020-06-12 ENCOUNTER — Encounter: Payer: BC Managed Care – PPO | Admitting: Obstetrics and Gynecology

## 2020-06-14 ENCOUNTER — Ambulatory Visit: Payer: BC Managed Care – PPO | Admitting: Occupational Therapy

## 2020-06-14 ENCOUNTER — Other Ambulatory Visit: Payer: Self-pay

## 2020-06-14 DIAGNOSIS — I890 Lymphedema, not elsewhere classified: Secondary | ICD-10-CM

## 2020-06-14 NOTE — Therapy (Signed)
[facility] MAIN [facility] SERVICES [address] [place], [place], [address] Phone: [phone]   Fax:  [phone]  Occupational Therapy Treatment  Patient Details  Name: Mallory Johnston MRN: [id number] Date of Birth: [birth year] Referring Provider (OT): Sheppard Plumber, NP   Encounter Date: 06/14/2020   OT End of Session - 06/14/20 1300    Visit Number 12    Number of Visits 36    Date for OT Re-Evaluation 07/01/20    OT Start Time 0808    OT Stop Time 0908    OT Time Calculation (min) 60 min    Activity Tolerance Patient tolerated treatment well;No increased pain    Behavior During Therapy WFL for tasks assessed/performed           Past Medical History:  Diagnosis Date  . H/O bilateral breast reduction surgery   . Lymphedema of left leg   . Ovarian cyst   . S/P tonsillectomy     Past Surgical History:  Procedure Laterality Date  . BREAST REDUCTION SURGERY    . OVARIAN CYST REMOVAL    . TONSILLECTOMY      There were no vitals filed for this visit.   Subjective Assessment - 06/14/20 0824    Subjective  Mallory Johnston presents for OT visit 12/36 to address BLE lymphedema and associated pain. Pt reports 7/10 L ankle pain this morning.    Pertinent History H/O ovarian cyst, tonsilectomy, B breast reduction; works 2 jobs standing and walking,                                OT Education - 06/14/20 1300    Education Details Continued skilled Pt/caregiver education  And LE ADL training throughout visit for lymphedema self care/ home program, including compression wrapping, compression garment and device wear/care, lymphatic pumping ther ex, simple self-MLD, and skin care. Discussed progress towards goals.    Person(s) Educated Patient    Methods Explanation;Demonstration;Handout    Comprehension Verbalized understanding;Returned demonstration;Need further instruction               OT Long Term Goals - 06/07/20  0800      OT LONG TERM GOAL #1   Title Pt will be able to verbalize signs and symptoms of cellulitis infection , and be able to name 6 common lymphedema precautions using printed resource for reference (modified independence)  to limit LE progression over time .    Baseline Max A    Time 5    Period Days    Status Achieved      OT LONG TERM GOAL #2   Title Pt will retain volumetric reduction achieved during Intensive Phase CDT for 6 months after transitioning to Self Management Phase as evidenced by no greater than 3% limb volume increase when measured for follow up.    Baseline Min A    Time 6    Period Months    Status New    Target Date 12/04/20      OT LONG TERM GOAL #3   Title Pt will be able to apply BLE, knee length, multi-layer, short stretch compression wraps using correct gradient techniques with modified independence (extra time) to achieve optimal limb volume reduction, and to return affected limb , as closely as possible, to premorbid size and shape.    Baseline Max A    Time 4    Period Days  Status Achieved      OT LONG TERM GOAL #4   Title Pt will achieve at least a 10% BLE limb volume reductions below knees ( ankle- tibial tuberosity A-D) during Intensive Phase CDT to improve AROM at LE joints for functional activities, ambulation and transfers, to reduce infection risk, and to limit LE progression.    Baseline Max A    Time 12    Period Weeks    Status On-going   achieved 8.29% decrease on 10th visit 06/07/20     OT LONG TERM GOAL #5   Title Pt will achieve and sustain 85% compliance with daily LE self-care home program components throughout Intensive Phase CDT, including impeccable skin care, lymphatic pumping therex, daily sequential pneumatic pump (one leg per day on alternating legs) and custom compression to ensure optimal limb volume reduction, to limit infection risk and to limit LE progression.    Baseline Max A    Time 12    Period Weeks    Status  Achieved      OT LONG TERM GOAL #6   Title Pt will demonstrate modified independence using assistive devices and extra time  during LE self-care training to don and doff properly fitting, custom compression garments/devices with Max A for optimal LE self-management over time.    Baseline Max A    Time 12    Period Days    Status On-going                 Plan - 06/14/20 1300    Clinical Impression Statement Despite increased foot/ankle pain today, no obvious increase of swelling or skin temp observed. Pt tolerated MLD without increased pain . Garments  have been ordered. Fit ASAP.    Occupational performance deficits (Please refer to evaluation for details): ADL's;IADL's;Work;Leisure;Social Participation;Other    Body Structure / Function / Physical Skills ADL;Decreased knowledge of precautions;Mobility;Decreased knowledge of use of DME;Edema;Skin integrity;Pain;IADL    Rehab Potential Good    Clinical Decision Making Several treatment options, min-mod task modification necessary    Comorbidities Affecting Occupational Performance: May have comorbidities impacting occupational performance    Modification or Assistance to Complete Evaluation  No modification of tasks or assist necessary to complete eval    OT Treatment/Interventions Self-care/ADL training;Therapeutic exercise;Manual lymph drainage;Patient/family education;Compression bandaging;Building services engineer;Therapeutic activities;Manual Therapy;DME and/or AE instruction    Plan Complete Decomgestive Therapy, Intensive and Self-Manageent Phase support: Manual lymphatic drainage (MLD) skin care, ther ex, compression wraps and garments. Consider    Recommended Other Services Fit Pt with BLE, custom, flat knit Jobst Elvarex compression knee highs. Consider Elvarex SOFT, ccl 3 (34-46 mmHg). Consider Jobst RELAX HOS device to limit fibrosis formation. Fit with Flexitouch advanced sequential pneumatic compression device ("PUMP") to  aid in daily LE self care at home over time           Patient will benefit from skilled therapeutic intervention in order to improve the following deficits and impairments:   Body Structure / Function / Physical Skills: ADL, Decreased knowledge of precautions, Mobility, Decreased knowledge of use of DME, Edema, Skin integrity, Pain, IADL       Visit Diagnosis: Lymphedema, not elsewhere classified    Problem List Patient Active Problem List   Diagnosis Date Noted  . Swelling of limb 11/29/2019  . Lymphedema 11/29/2019  . Severe recurrent major depression without psychotic features (HCC) 03/07/2019  . Suicidal ideation 03/06/2019  . Noncompliance 03/06/2019  . Class 1 obesity due to excess calories  without serious comorbidity with body mass index (BMI) of 30.0 to 30.9 in adult 02/11/2017  . History of cervical dysplasia 02/11/2017  . Liver lesion, right lobe 10/17/2016  . Von Willebrand's disease ([place]) 11/10/2015  . PCOS (polycystic ovarian syndrome) 09/20/2015    Andrey Spearman, MS, OTR/L, [facility] 06/14/20 1:04 PM  [place] MAIN [facility] SERVICES [address] [place], [place], [address] Phone: [phone]   Fax:  [phone]  Name: Mallory Johnston MRN: [id number] Date of Birth: Mar 31, 1991

## 2020-06-19 ENCOUNTER — Encounter: Payer: Self-pay | Admitting: Emergency Medicine

## 2020-06-19 ENCOUNTER — Ambulatory Visit
Admission: EM | Admit: 2020-06-19 | Discharge: 2020-06-19 | Disposition: A | Discharge status: Home / Self Care | Payer: BC Managed Care – PPO | Attending: Family Medicine | Admitting: Family Medicine

## 2020-06-19 ENCOUNTER — Other Ambulatory Visit: Payer: Self-pay

## 2020-06-19 DIAGNOSIS — M654 Radial styloid tenosynovitis [de Quervain]: Secondary | ICD-10-CM | POA: Diagnosis not present

## 2020-06-19 DIAGNOSIS — G5601 Carpal tunnel syndrome, right upper limb: Secondary | ICD-10-CM | POA: Diagnosis not present

## 2020-06-19 MED ORDER — NAPROXEN 500 MG PO TABS: 500.0000 mg | ORAL_TABLET | Freq: Two times a day (BID) | ORAL | 0 refills | Status: DC

## 2020-06-19 NOTE — ED Triage Notes (Signed)
Patient c/o right wrist pain that started 2 months ago. 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

## 2020-06-19 NOTE — ED Provider Notes (Signed)
[facility]    CSN: [id number] Arrival date & time: 06/19/20  4431      History   Chief Complaint Chief Complaint  Patient presents with   Wrist Pain    HPI Mallory Johnston is a 29 y.o. female.   Patient is a 29 year old female who presents with complaint of pain to her right wrist.  Patient reports pain is been ongoing for about 2 months.  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  Reports pain is on the thumb side of her wrist and has trouble making a fist because her thumb hurts.  She reports some numbness and tingling to her fingers in the morning.  She does not take any meds.     Past Medical History:  Diagnosis Date   H/O bilateral breast reduction surgery    Lymphedema of left leg    Ovarian cyst    S/P tonsillectomy     Patient Active Problem List   Diagnosis Date Noted   Swelling of limb 11/29/2019   Lymphedema 11/29/2019   Severe recurrent major depression without psychotic features (HCC) 03/07/2019   Suicidal ideation 03/06/2019   Noncompliance 03/06/2019   Class 1 obesity due to excess calories without serious comorbidity with body mass index (BMI) of 30.0 to 30.9 in adult 02/11/2017   History of cervical dysplasia 02/11/2017   Liver lesion, right lobe 10/17/2016   Von Willebrand's disease (HCC) 11/10/2015   PCOS (polycystic ovarian syndrome) 09/20/2015    Past Surgical History:  Procedure Laterality Date   BREAST REDUCTION SURGERY     OVARIAN CYST REMOVAL     TONSILLECTOMY      OB History    Gravida  1   Para      Term      Preterm      AB  1   Living        SAB  1   TAB      Ectopic      Multiple      Live Births               Home Medications    Prior to Admission medications   Medication Sig Start Date End Date Taking? Authorizing Provider  mirtazapine (REMERON) 30 MG tablet Take 1 tablet (30 mg  total) by mouth at bedtime. 04/08/19  Yes Clapacs, Jackquline Denmark, MD  norethindrone-ethinyl estradiol (JUNEL FE 1/20) 1-20 MG-MCG tablet Take 1 tablet by mouth daily. 06/09/19  Yes Linzie Collin, MD  traZODone (DESYREL) 100 MG tablet TAKE 1 TABLET (100 MG TOTAL) BY MOUTH AT BEDTIME AS NEEDED FOR SLEEP. 07/08/19  Yes Clapacs, Jackquline Denmark, MD  naproxen (NAPROSYN) 500 MG tablet Take 1 tablet (500 mg total) by mouth 2 (two) times daily. 06/19/20   Candis Schatz, PA-C    Family History Family History  Problem Relation Age of Onset   Hypertension Father    Diabetes Mother    Breast cancer Neg Hx    Ovarian cancer Neg Hx    Colon cancer Neg Hx     Social History Social History   Tobacco Use   Smoking status: Never Smoker   Smokeless tobacco: Never Used  Vaping Use   Vaping Use: Never used  Substance Use Topics   Alcohol use: Yes    Comment: rare   Drug use: No  Allergies   Amoxicillin and Percocet [oxycodone-acetaminophen]   Review of Systems Review of Systems as noted above in HPI.  Other systems reviewed and found to be negative   Physical Exam Triage Vital Signs ED Triage Vitals  Enc Vitals Group     BP 06/19/20 0855 110/76     Pulse --      Resp 06/19/20 0855 18     Temp 06/19/20 0855 98.6 F (37 C)     Temp Source 06/19/20 0855 Oral     SpO2 06/19/20 0855 99 %     Weight 06/19/20 0853 207 lb (93.9 kg)     Height 06/19/20 0853 5\' 1"  (1.549 m)     Head Circumference --      Peak Flow --      Pain Score 06/19/20 0853 9     Pain Loc --      Pain Edu? --      Excl. in [place]? --    No data found.  Updated Vital Signs BP 110/76 (BP Location: Left Arm)    Temp 98.6 F (37 C) (Oral)    Resp 18    Ht 5\' 1"  (1.549 m)    Wt 207 lb (93.9 kg)    LMP 05/04/2020    SpO2 99%    BMI 39.11 kg/m   Visual Acuity Right Eye Distance:   Left Eye Distance:   Bilateral Distance:    Right Eye Near:   Left Eye Near:    Bilateral Near:     Physical Exam Constitutional:       Appearance: Normal appearance.  Musculoskeletal:     Right wrist: Tenderness present. Decreased range of motion.     Comments: Positive Finkelstein test on right with patient having difficulty making even minimal flexion with the thumb tuft.  Compression sign for carpal tunnel elicited numbness and tingling in the first digit after about 20 seconds.  Phalen's test positive with numbness and tingling at the tips of the fingers  Neurological:     Mental Status: She is alert.      UC Treatments / Results  Labs (all labs ordered are listed, but only abnormal results are displayed) Labs Reviewed - No data to display  EKG   Radiology No results found.  Procedures Procedures (including critical care time)  Medications Ordered in UC Medications - No data to display  Initial Impression / Assessment and Plan / UC Course  I have reviewed the triage vital signs and the nursing notes.  Pertinent labs & imaging results that were available during my care of the patient were reviewed by me and considered in my medical decision making (see chart for details).     Exam consistent with de Quervain's and carpal tunnel.  Give her spica splint and a prescription for naproxen. follow-up with orthopedics if no improvement Final Clinical Impressions(s) / UC Diagnoses   Final diagnoses:  Carpal tunnel syndrome of right wrist  Radial styloid tenosynovitis (de quervain)     Discharge Instructions     -Wear splint as able if work allows.  W wear while sleeping to help with the pain, numbness, tingling when awake -Naproxen: 1 tablet twice a day -Can also take Tylenol for pain -Do not take ibuprofen while taking the naproxen -Recommend over-the-counter Pepcid, Nexium, or similar daily while taking the naproxen -Follow-up with orthopedics should you have no improvement in 7 to 10 days.    ED Prescriptions    Medication Sig Dispense Auth.  Provider   naproxen (NAPROSYN) 500 MG tablet Take  1 tablet (500 mg total) by mouth 2 (two) times daily. 60 tablet Candis Schatz, PA-C     PDMP not reviewed this encounter.   Candis Schatz, PA-C 06/19/20 [phone]

## 2020-06-19 NOTE — Discharge Instructions (Addendum)
-  Wear splint as able if work allows.  W wear while sleeping to help with the pain, numbness, tingling when awake -Naproxen: 1 tablet twice a day -Can also take Tylenol for pain -Do not take ibuprofen while taking the naproxen -Recommend over-the-counter Pepcid, Nexium, or similar daily while taking the naproxen -Follow-up with orthopedics should you have no improvement in 7 to 10 days.

## 2020-06-21 ENCOUNTER — Other Ambulatory Visit: Payer: Self-pay

## 2020-06-21 ENCOUNTER — Encounter: Payer: Self-pay | Admitting: Obstetrics and Gynecology

## 2020-06-21 ENCOUNTER — Ambulatory Visit (INDEPENDENT_AMBULATORY_CARE_PROVIDER_SITE_OTHER): Payer: BC Managed Care – PPO | Admitting: Obstetrics and Gynecology

## 2020-06-21 ENCOUNTER — Ambulatory Visit: Payer: BC Managed Care – PPO | Admitting: Occupational Therapy

## 2020-06-21 ENCOUNTER — Other Ambulatory Visit (HOSPITAL_COMMUNITY)
Admission: RE | Admit: 2020-06-21 | Discharge: 2020-06-21 | Disposition: A | Discharge status: Home / Self Care | Payer: BC Managed Care – PPO | Source: Ambulatory Visit | Attending: Obstetrics and Gynecology | Admitting: Obstetrics and Gynecology

## 2020-06-21 VITALS — BP 118/81 | HR 76 | Ht 61.0 in | Wt 208.3 lb

## 2020-06-21 DIAGNOSIS — Z3041 Encounter for surveillance of contraceptive pills: Secondary | ICD-10-CM | POA: Diagnosis not present

## 2020-06-21 DIAGNOSIS — Z124 Encounter for screening for malignant neoplasm of cervix: Secondary | ICD-10-CM

## 2020-06-21 DIAGNOSIS — I890 Lymphedema, not elsewhere classified: Secondary | ICD-10-CM | POA: Diagnosis not present

## 2020-06-21 DIAGNOSIS — Z0000 Encounter for general adult medical examination without abnormal findings: Secondary | ICD-10-CM | POA: Diagnosis not present

## 2020-06-21 MED ORDER — DESOGESTREL-ETHINYL ESTRADIOL 0.15-0.02/0.01 MG (21/5) PO TABS: 1.0000 | ORAL_TABLET | Freq: Every day | ORAL | 3 refills | Status: DC

## 2020-06-21 MED ORDER — NORETHIN ACE-ETH ESTRAD-FE 1-20 MG-MCG PO TABS: 1.0000 | ORAL_TABLET | Freq: Every day | ORAL | 11 refills | Status: DC

## 2020-06-21 NOTE — Progress Notes (Signed)
HPI:      [name] is a 29 y.o. G1P0010 who LMP was Patient's last menstrual period was 03/31/2020 (approximate).  Subjective:   She presents today for her annual examination.  She has been taking OCPs for her PCO and to reduce her bleeding because she has von Willebrand's disease.  She states that in April and May she had almost continuous bleeding.  She does state that she misses approximately 1 pill/week but has been working harder to take these more regularly.  She also states that she does not always get her menstrual period at the end of the pack. She is not using OCPs for birth control. She additionally has an issue of carpal tunnel syndrome in her right wrist and she is currently wearing a brace.  She also has been found to have lymphedema of her left leg and she has a pump on that leg to try to decrease the swelling.    Hx: The following portions of the patient's history were reviewed and updated as appropriate:             She  has a past medical history of H/O bilateral breast reduction surgery, Lymphedema of left leg, Ovarian cyst, and S/P tonsillectomy. She does not have any pertinent problems on file. She  has a past surgical history that includes Tonsillectomy; Breast reduction surgery; and Ovarian cyst removal. Her family history includes Diabetes in her mother; Hypertension in her father. She  reports that she has never smoked. She has never used smokeless tobacco. She reports current alcohol use. She reports that she does not use drugs. She has a current medication list which includes the following prescription(s): mirtazapine, naproxen, trazodone, and desogestrel-ethinyl estradiol. She is allergic to amoxicillin and percocet [oxycodone-acetaminophen].       Review of Systems:  Review of Systems  Constitutional: Denied constitutional symptoms, night sweats, recent illness, fatigue, fever, insomnia and weight loss.  Eyes: Denied eye symptoms, eye pain, photophobia,  vision change and visual disturbance.  Ears/Nose/Throat/Neck: Denied ear, nose, throat or neck symptoms, hearing loss, nasal discharge, sinus congestion and sore throat.  Cardiovascular: Denied cardiovascular symptoms, arrhythmia, chest pain/pressure, edema, exercise intolerance, orthopnea and palpitations.  Respiratory: Denied pulmonary symptoms, asthma, pleuritic pain, productive sputum, cough, dyspnea and wheezing.  Gastrointestinal: Denied, gastro-esophageal reflux, melena, nausea and vomiting.  Genitourinary: Denied genitourinary symptoms including symptomatic vaginal discharge, pelvic relaxation issues, and urinary complaints.  Musculoskeletal: Denied musculoskeletal symptoms, stiffness, swelling, muscle weakness and myalgia.  Dermatologic: Denied dermatology symptoms, rash and scar.  Neurologic: Denied neurology symptoms, dizziness, headache, neck pain and syncope.  Psychiatric: Denied psychiatric symptoms, anxiety and depression.  Endocrine: Denied endocrine symptoms including hot flashes and night sweats.   Meds:   Current Outpatient Medications on File Prior to Visit  Medication Sig Dispense Refill  . mirtazapine (REMERON) 30 MG tablet Take 1 tablet (30 mg total) by mouth at bedtime. 30 tablet 0  . naproxen (NAPROSYN) 500 MG tablet Take 1 tablet (500 mg total) by mouth 2 (two) times daily. 60 tablet 0  . traZODone (DESYREL) 100 MG tablet TAKE 1 TABLET (100 MG TOTAL) BY MOUTH AT BEDTIME AS NEEDED FOR SLEEP. 30 tablet 0   No current facility-administered medications on file prior to visit.    Objective:     Vitals:   06/21/20 0958  BP: 118/81  Pulse: 76              Physical examination General NAD, Conversant  HEENT Atraumatic;  Op clear with mmm.  Normo-cephalic. Pupils reactive. Anicteric sclerae  Thyroid/Neck Smooth without nodularity or enlargement. Normal ROM.  Neck Supple.  Skin No rashes, lesions or ulceration. Normal palpated skin turgor. No nodularity.  Breasts:  No masses or discharge.  Symmetric.  No axillary adenopathy.  Breast reduction scars  Lungs: Clear to auscultation.No rales or wheezes. Normal Respiratory effort, no retractions.  Heart: NSR.  No murmurs or rubs appreciated. No periferal edema  Abdomen: Soft.  Non-tender.  No masses.  No HSM. No hernia  Extremities: Moves all appropriately.  Normal ROM for age. No lymphadenopathy.  Neuro: Oriented to PPT.  Normal mood. Normal affect.     Pelvic:   Vulva: Normal appearance.  No lesions.  Vagina: No lesions or abnormalities noted.  Support: Normal pelvic support.  Urethra No masses tenderness or scarring.  Meatus Normal size without lesions or prolapse.  Cervix: Normal appearance.  No lesions.  Anus: Normal exam.  No lesions.  Perineum: Normal exam.  No lesions.        Bimanual   Uterus:  10 weeks size.  non-tender.  Mobile.  AV.  Adnexae: No masses.  Non-tender to palpation.  Cul-de-sac: Negative for abnormality.   Exam limited by patient body habitus   Assessment:    G1P0010 Patient Active Problem List   Diagnosis Date Noted  . Swelling of limb 11/29/2019  . Lymphedema 11/29/2019  . Severe recurrent major depression without psychotic features (HCC) 03/07/2019  . Suicidal ideation 03/06/2019  . Noncompliance 03/06/2019  . Class 1 obesity due to excess calories without serious comorbidity with body mass index (BMI) of 30.0 to 30.9 in adult 02/11/2017  . History of cervical dysplasia 02/11/2017  . Liver lesion, right lobe 10/17/2016  . Von Willebrand's disease (HCC) 11/10/2015  . PCOS (polycystic ovarian syndrome) 09/20/2015     1. Encounter for annual physical exam   2. Encounter for birth control pills maintenance     Patient having breakthrough bleeding on very low-dose pill and not diligent about taking pills although she has been working on this.   Plan:            1.  Basic Screening Recommendations The basic screening recommendations for asymptomatic women were  discussed with the patient during her visit.  The age-appropriate recommendations were discussed with her and the rational for the tests reviewed.  When I am informed by the patient that another primary care physician has previously obtained the age-appropriate tests and they are up-to-date, only outstanding tests are ordered and referrals given as necessary.  Abnormal results of tests will be discussed with her when all of her results are completed.  Routine preventative health maintenance measures emphasized: Exercise/Diet/Weight control, Tobacco Warnings, Alcohol/Substance use risks and Stress Management Pap performed 2.  We will change OCPs to a better cycle control pill.  Patient instructed in the change.  She is to contact us if she has continued heavy irregular bleeding. Orders No orders of the defined types were placed in this encounter.    Meds ordered this encounter  Medications  . DISCONTD: norethindrone-ethinyl estradiol (JUNEL FE 1/20) 1-20 MG-MCG tablet    Sig: Take 1 tablet by mouth daily.    Dispense:  28 tablet    Refill:  11  . desogestrel-ethinyl estradiol (MIRCETTE) 0.15-0.02/0.01 MG (21/5) tablet    Sig: Take 1 tablet by mouth at bedtime.    Dispense:  84 tablet    Refill:  3  F/U  Return in about 1 year (around 06/21/2021) for Annual Physical.  Finis Bud, M.D. 06/21/2020 10:28 AM

## 2020-06-21 NOTE — Therapy (Signed)
[place] [facility] MAIN [facility] SERVICES [address] [place], [place], [address] Phone: [phone]   Fax:  [phone]  Occupational Therapy Treatment  Patient Details  Name: Mallory Johnston MRN: [id number] Date of Birth: [birth year] Referring Provider (OT): Sheppard Plumber, NP   Encounter Date: 06/21/2020   OT End of Session - 06/21/20 1238    Visit Number 13    Number of Visits 36    Date for OT Re-Evaluation 07/01/20    OT Start Time 0806    OT Stop Time 0906    OT Time Calculation (min) 60 min    Activity Tolerance Patient tolerated treatment well;No increased pain    Behavior During Therapy WFL for tasks assessed/performed           Past Medical History:  Diagnosis Date  . H/O bilateral breast reduction surgery   . Lymphedema of left leg   . Ovarian cyst   . S/P tonsillectomy     Past Surgical History:  Procedure Laterality Date  . BREAST REDUCTION SURGERY    . OVARIAN CYST REMOVAL    . TONSILLECTOMY      There were no vitals filed for this visit.   Subjective Assessment - 06/21/20 1233    Subjective  Mallory Johnston presents for OT visit 13/36 to address BLE lymphedema. Pt presents without compression wraps in place. Pt c/o feeling tired this morning. Leg pain not rated numerically.    Pertinent History H/O ovarian cyst, tonsilectomy, B breast reduction; works 2 jobs standing and walking,                        OT Treatments/Exercises (OP) - 06/21/20 0001      ADLs   ADL Education Given Yes      Manual Therapy   Manual Therapy Edema management;Compression Bandaging    Manual Lymphatic Drainage (MLD) LLE MLD utilizing shrt neck sequence, deep abdominal pathway , functionall inguinal LN and UJ strokes to LLE from proximal to distal.    Compression Bandaging LLE knee length ccompression wrap as established                  OT Education - 06/21/20 1235    Education Details Pt edu for next steps for  obtaining custom garments thru vendor. Discussed important questions to ask vendor, including discout option for out of pocket garments, OOP cost after insurance, availability of payment plan, and if Pt needs to obtain medical records, or if DME vendor will complete this step.    Person(s) Educated Patient    Methods Explanation;Demonstration;Handout    Comprehension Verbalized understanding;Returned demonstration;Need further instruction               OT Long Term Goals - 06/07/20 0800      OT LONG TERM GOAL #1   Title Pt will be able to verbalize signs and symptoms of cellulitis infection , and be able to name 6 common lymphedema precautions using printed resource for reference (modified independence)  to limit LE progression over time .    Baseline Max A    Time 5    Period Days    Status Achieved      OT LONG TERM GOAL #2   Title Pt will retain volumetric reduction achieved during Intensive Phase CDT for 6 months after transitioning to Self Management Phase as evidenced by no greater than 3% limb volume increase when measured for follow up.  Baseline Min A    Time 6    Period Months    Status New    Target Date 12/04/20      OT LONG TERM GOAL #3   Title Pt will be able to apply BLE, knee length, multi-layer, short stretch compression wraps using correct gradient techniques with modified independence (extra time) to achieve optimal limb volume reduction, and to return affected limb , as closely as possible, to premorbid size and shape.    Baseline Max A    Time 4    Period Days    Status Achieved      OT LONG TERM GOAL #4   Title Pt will achieve at least a 10% BLE limb volume reductions below knees ( ankle- tibial tuberosity A-D) during Intensive Phase CDT to improve AROM at LE joints for functional activities, ambulation and transfers, to reduce infection risk, and to limit LE progression.    Baseline Max A    Time 12    Period Weeks    Status On-going   achieved 8.29%  decrease on 10th visit 06/07/20     OT LONG TERM GOAL #5   Title Pt will achieve and sustain 85% compliance with daily LE self-care home program components throughout Intensive Phase CDT, including impeccable skin care, lymphatic pumping therex, daily sequential pneumatic pump (one leg per day on alternating legs) and custom compression to ensure optimal limb volume reduction, to limit infection risk and to limit LE progression.    Baseline Max A    Time 12    Period Weeks    Status Achieved      OT LONG TERM GOAL #6   Title Pt will demonstrate modified independence using assistive devices and extra time  during LE self-care training to don and doff properly fitting, custom compression garments/devices with Max A for optimal LE self-management over time.    Baseline Max A    Time 12    Period Days    Status On-going                 Plan - 06/21/20 1239    Clinical Impression Statement Pt tolerated LLE/llQ MLD and knee length gradient compression wraps today without increaded pain. She continues to manage swelling between sessions usoing home program components diligently. Awaiting custom garment delivery and fitting by new DME vendor. Pt to follow up with vendor by phone to understand her financial responsibility. Cont as per POC.    Occupational performance deficits (Please refer to evaluation for details): ADL's;IADL's;Work;Leisure;Social Participation;Other    Body Structure / Function / Physical Skills ADL;Decreased knowledge of precautions;Mobility;Decreased knowledge of use of DME;Edema;Skin integrity;Pain;IADL    Rehab Potential Good    Clinical Decision Making Several treatment options, min-mod task modification necessary    Comorbidities Affecting Occupational Performance: May have comorbidities impacting occupational performance    Modification or Assistance to Complete Evaluation  No modification of tasks or assist necessary to complete eval    OT Treatment/Interventions  Self-care/ADL training;Therapeutic exercise;Manual lymph drainage;Patient/family education;Compression bandaging;Building services engineer;Therapeutic activities;Manual Therapy;DME and/or AE instruction    Plan Complete Decomgestive Therapy, Intensive and Self-Manageent Phase support: Manual lymphatic drainage (MLD) skin care, ther ex, compression wraps and garments. Consider    Recommended Other Services Fit Pt with BLE, custom, flat knit Jobst Elvarex compression knee highs. Consider Elvarex SOFT, ccl 3 (34-46 mmHg). Consider Jobst RELAX HOS device to limit fibrosis formation. Fit with Flexitouch advanced sequential pneumatic compression device ("PUMP") to aid in  daily LE self care at home over time           Patient will benefit from skilled therapeutic intervention in order to improve the following deficits and impairments:   Body Structure / Function / Physical Skills: ADL, Decreased knowledge of precautions, Mobility, Decreased knowledge of use of DME, Edema, Skin integrity, Pain, IADL       Visit Diagnosis: Lymphedema, not elsewhere classified    Problem List Patient Active Problem List   Diagnosis Date Noted  . Swelling of limb 11/29/2019  . Lymphedema 11/29/2019  . Severe recurrent major depression without psychotic features ([place]) 03/07/2019  . Suicidal ideation 03/06/2019  . Noncompliance 03/06/2019  . Class 1 obesity due to excess calories without serious comorbidity with body mass index (BMI) of 30.0 to 30.9 in adult 02/11/2017  . History of cervical dysplasia 02/11/2017  . Liver lesion, right lobe 10/17/2016  . Von Willebrand's disease ([place]) 11/10/2015  . PCOS (polycystic ovarian syndrome) 09/20/2015   Andrey Spearman, MS, OTR/L, [facility] 06/21/20 12:43 PM   [place] MAIN [facility] SERVICES [address] [place], [place], [address] Phone: [phone]   Fax:  [phone]  Name: Mallory Johnston MRN: [id number] Date of Birth:  16-Dec-1991

## 2020-06-21 NOTE — Addendum Note (Signed)
Addended by: Dorian Pod on: 06/21/2020 02:25 PM   Modules accepted: Orders

## 2020-06-25 ENCOUNTER — Other Ambulatory Visit: Payer: Self-pay

## 2020-06-25 ENCOUNTER — Ambulatory Visit: Payer: BC Managed Care – PPO | Admitting: Occupational Therapy

## 2020-06-25 DIAGNOSIS — I890 Lymphedema, not elsewhere classified: Secondary | ICD-10-CM | POA: Diagnosis not present

## 2020-06-25 NOTE — Therapy (Signed)
[place] [facility] MAIN [facility] SERVICES [address] [place], [place], [address] Phone: [phone]   Fax:  [phone]  Occupational Therapy Treatment  Patient Details  Name: Mallory Johnston MRN: [id number] Date of Birth: 1991-07-08 Referring Provider (OT): Sheppard Plumber, NP   Encounter Date: 06/25/2020   OT End of Session - 06/25/20 1230    Visit Number 14    Number of Visits 36    Date for OT Re-Evaluation 07/01/20    OT Start Time 0805    OT Stop Time 0900    OT Time Calculation (min) 55 min    Activity Tolerance Patient tolerated treatment well;No increased pain    Behavior During Therapy WFL for tasks assessed/performed           Past Medical History:  Diagnosis Date  . H/O bilateral breast reduction surgery   . Lymphedema of left leg   . Ovarian cyst   . S/P tonsillectomy     Past Surgical History:  Procedure Laterality Date  . BREAST REDUCTION SURGERY    . OVARIAN CYST REMOVAL    . TONSILLECTOMY      There were no vitals filed for this visit.                 OT Treatments/Exercises (OP) - 06/25/20 0001      ADLs   ADL Education Given Yes      Manual Therapy   Manual Therapy Edema management;Compression Bandaging    Manual Lymphatic Drainage (MLD) LLE MLD utilizing shrt neck sequence, deep abdominal pathway , functionall inguinal LN and UJ strokes to LLE from proximal to distal.    Compression Bandaging LLE knee length ccompression wrap as established                  OT Education - 06/25/20 1230    Education Details Continued skilled Pt/caregiver education  And LE ADL training throughout visit for lymphedema self care/ home program, including compression wrapping, compression garment and device wear/care, lymphatic pumping ther ex, simple self-MLD, and skin care. Discussed progress towards goals.    Person(s) Educated Patient    Methods Explanation;Demonstration;Handout    Comprehension Verbalized  understanding;Returned demonstration;Need further instruction               OT Long Term Goals - 06/07/20 0800      OT LONG TERM GOAL #1   Title Pt will be able to verbalize signs and symptoms of cellulitis infection , and be able to name 6 common lymphedema precautions using printed resource for reference (modified independence)  to limit LE progression over time .    Baseline Max A    Time 5    Period Days    Status Achieved      OT LONG TERM GOAL #2   Title Pt will retain volumetric reduction achieved during Intensive Phase CDT for 6 months after transitioning to Self Management Phase as evidenced by no greater than 3% limb volume increase when measured for follow up.    Baseline Min A    Time 6    Period Months    Status New    Target Date 12/04/20      OT LONG TERM GOAL #3   Title Pt will be able to apply BLE, knee length, multi-layer, short stretch compression wraps using correct gradient techniques with modified independence (extra time) to achieve optimal limb volume reduction, and to return affected limb , as closely as possible,  to premorbid size and shape.    Baseline Max A    Time 4    Period Days    Status Achieved      OT LONG TERM GOAL #4   Title Pt will achieve at least a 10% BLE limb volume reductions below knees ( ankle- tibial tuberosity A-D) during Intensive Phase CDT to improve AROM at LE joints for functional activities, ambulation and transfers, to reduce infection risk, and to limit LE progression.    Baseline Max A    Time 12    Period Weeks    Status On-going   achieved 8.29% decrease on 10th visit 06/07/20     OT LONG TERM GOAL #5   Title Pt will achieve and sustain 85% compliance with daily LE self-care home program components throughout Intensive Phase CDT, including impeccable skin care, lymphatic pumping therex, daily sequential pneumatic pump (one leg per day on alternating legs) and custom compression to ensure optimal limb volume reduction, to  limit infection risk and to limit LE progression.    Baseline Max A    Time 12    Period Weeks    Status Achieved      OT LONG TERM GOAL #6   Title Pt will demonstrate modified independence using assistive devices and extra time  during LE self-care training to don and doff properly fitting, custom compression garments/devices with Max A for optimal LE self-management over time.    Baseline Max A    Time 12    Period Days    Status On-going                 Plan - 06/25/20 1231    Clinical Impression Statement Provided MLD and compression wraps to LLE as established without increased  pain. custom cmpression garments/ devices on order. Cont as per POC.    Occupational performance deficits (Please refer to evaluation for details): ADL's;IADL's;Work;Leisure;Social Participation;Other    Body Structure / Function / Physical Skills ADL;Decreased knowledge of precautions;Mobility;Decreased knowledge of use of DME;Edema;Skin integrity;Pain;IADL    Rehab Potential Good    Clinical Decision Making Several treatment options, min-mod task modification necessary    Comorbidities Affecting Occupational Performance: May have comorbidities impacting occupational performance    Modification or Assistance to Complete Evaluation  No modification of tasks or assist necessary to complete eval    OT Treatment/Interventions Self-care/ADL training;Therapeutic exercise;Manual lymph drainage;Patient/family education;Compression bandaging;Building services engineer;Therapeutic activities;Manual Therapy;DME and/or AE instruction    Plan Complete Decomgestive Therapy, Intensive and Self-Manageent Phase support: Manual lymphatic drainage (MLD) skin care, ther ex, compression wraps and garments. Consider    Recommended Other Services Fit Pt with BLE, custom, flat knit Jobst Elvarex compression knee highs. Consider Elvarex SOFT, ccl 3 (34-46 mmHg). Consider Jobst RELAX HOS device to limit fibrosis formation. Fit  with Flexitouch advanced sequential pneumatic compression device ("PUMP") to aid in daily LE self care at home over time           Patient will benefit from skilled therapeutic intervention in order to improve the following deficits and impairments:   Body Structure / Function / Physical Skills: ADL, Decreased knowledge of precautions, Mobility, Decreased knowledge of use of DME, Edema, Skin integrity, Pain, IADL       Visit Diagnosis: Lymphedema, not elsewhere classified    Problem List Patient Active Problem List   Diagnosis Date Noted  . Swelling of limb 11/29/2019  . Lymphedema 11/29/2019  . Severe recurrent major depression without psychotic features ([facility])  03/07/2019  . Suicidal ideation 03/06/2019  . Noncompliance 03/06/2019  . Class 1 obesity due to excess calories without serious comorbidity with body mass index (BMI) of 30.0 to 30.9 in adult 02/11/2017  . History of cervical dysplasia 02/11/2017  . Liver lesion, right lobe 10/17/2016  . Von Willebrand's disease ([place]) 11/10/2015  . PCOS (polycystic ovarian syndrome) 09/20/2015    Andrey Spearman, MS, OTR/L, [facility] 06/25/20 12:33 PM   [place] MAIN [facility] SERVICES [address] [place], [place], [address] Phone: [phone]   Fax:  [phone]  Name: Mallory Johnston MRN: [id number] Date of Birth: 01-04-1991

## 2020-06-26 LAB — CYTOLOGY - PAP
Comment: NEGATIVE
Diagnosis: NEGATIVE
High risk HPV: NEGATIVE

## 2020-06-28 ENCOUNTER — Other Ambulatory Visit: Payer: Self-pay

## 2020-06-28 ENCOUNTER — Ambulatory Visit: Payer: BC Managed Care – PPO | Attending: Nurse Practitioner | Admitting: Occupational Therapy

## 2020-06-28 DIAGNOSIS — I890 Lymphedema, not elsewhere classified: Secondary | ICD-10-CM | POA: Insufficient documentation

## 2020-06-28 NOTE — Therapy (Signed)
[place] [facility] MAIN [facility] SERVICES [address] [place], [place], [address] Phone: [phone]   Fax:  [phone]  Occupational Therapy Treatment  Patient Details  Name: Mallory Johnston MRN: [id number] Date of Birth: 09-26-1991 Referring Provider (OT): Sheppard Plumber, NP   Encounter Date: 06/28/2020   OT End of Session - 06/28/20 0904    Visit Number 15    Number of Visits 36    Date for OT Re-Evaluation 07/01/20    OT Start Time 0810    OT Stop Time 0903    OT Time Calculation (min) 53 min    Activity Tolerance Patient tolerated treatment well;No increased pain    Behavior During Therapy WFL for tasks assessed/performed           Past Medical History:  Diagnosis Date  . H/O bilateral breast reduction surgery   . Lymphedema of left leg   . Ovarian cyst   . S/P tonsillectomy     Past Surgical History:  Procedure Laterality Date  . BREAST REDUCTION SURGERY    . OVARIAN CYST REMOVAL    . TONSILLECTOMY      There were no vitals filed for this visit.   Subjective Assessment - 06/28/20 0816    Subjective  Mallory Johnston presents for OT visit 14/36 to address BLE lymphedema. Pt presents without compression wraps in place. Pt denies leg pain today. "It's like a 5 in my ankle."    Pertinent History H/O ovarian cyst, tonsilectomy, B breast reduction; works 2 jobs standing and walking,                        OT Treatments/Exercises (OP) - 06/28/20 0001      ADLs   ADL Education Given Yes      Manual Therapy   Manual Therapy Edema management;Taping;Compression Bandaging;Manual Lymphatic Drainage (MLD)    Manual therapy comments Kinesiotape to L ankle in  typical pattern for ankle eversion sprain    Manual Lymphatic Drainage (MLD) LLE MLD utilizing shrt neck sequence, deep abdominal pathway , functionall inguinal LN and UJ strokes to LLE from proximal to distal.    Compression Bandaging LLE knee length ccompression wrap as  established                  OT Education - 06/28/20 0818    Education Details Pt edu for kinesiotape for ankle sprain and lymphatic transport.    Person(s) Educated Patient    Methods Explanation;Demonstration;Handout    Comprehension Verbalized understanding;Returned demonstration;Need further instruction               OT Long Term Goals - 06/07/20 0800      OT LONG TERM GOAL #1   Title Pt will be able to verbalize signs and symptoms of cellulitis infection , and be able to name 6 common lymphedema precautions using printed resource for reference (modified independence)  to limit LE progression over time .    Baseline Max A    Time 5    Period Days    Status Achieved      OT LONG TERM GOAL #2   Title Pt will retain volumetric reduction achieved during Intensive Phase CDT for 6 months after transitioning to Self Management Phase as evidenced by no greater than 3% limb volume increase when measured for follow up.    Baseline Min A    Time 6    Period Months  Status New    Target Date 12/04/20      OT LONG TERM GOAL #3   Title Pt will be able to apply BLE, knee length, multi-layer, short stretch compression wraps using correct gradient techniques with modified independence (extra time) to achieve optimal limb volume reduction, and to return affected limb , as closely as possible, to premorbid size and shape.    Baseline Max A    Time 4    Period Days    Status Achieved      OT LONG TERM GOAL #4   Title Pt will achieve at least a 10% BLE limb volume reductions below knees ( ankle- tibial tuberosity A-D) during Intensive Phase CDT to improve AROM at LE joints for functional activities, ambulation and transfers, to reduce infection risk, and to limit LE progression.    Baseline Max A    Time 12    Period Weeks    Status On-going   achieved 8.29% decrease on 10th visit 06/07/20     OT LONG TERM GOAL #5   Title Pt will achieve and sustain 85% compliance with daily  LE self-care home program components throughout Intensive Phase CDT, including impeccable skin care, lymphatic pumping therex, daily sequential pneumatic pump (one leg per day on alternating legs) and custom compression to ensure optimal limb volume reduction, to limit infection risk and to limit LE progression.    Baseline Max A    Time 12    Period Weeks    Status Achieved      OT LONG TERM GOAL #6   Title Pt will demonstrate modified independence using assistive devices and extra time  during LE self-care training to don and doff properly fitting, custom compression garments/devices with Max A for optimal LE self-management over time.    Baseline Max A    Time 12    Period Days    Status On-going                 Plan - 06/28/20 0907    Clinical Impression Statement MLD as established -proximal to distal- to LLE/LLQ. Kinesiotape to L ankle to stabilize joint and relieve eversion sprain discomfort. Compression wraps from base of toes to popliteal. All protocols tolerated well without increased pain. Cont as per POC.    Occupational performance deficits (Please refer to evaluation for details): ADL's;IADL's;Work;Leisure;Social Participation;Other    Body Structure / Function / Physical Skills ADL;Decreased knowledge of precautions;Mobility;Decreased knowledge of use of DME;Edema;Skin integrity;Pain;IADL    Rehab Potential Good    Clinical Decision Making Several treatment options, min-mod task modification necessary    Comorbidities Affecting Occupational Performance: May have comorbidities impacting occupational performance    Modification or Assistance to Complete Evaluation  No modification of tasks or assist necessary to complete eval    OT Treatment/Interventions Self-care/ADL training;Therapeutic exercise;Manual lymph drainage;Patient/family education;Compression bandaging;Building services engineer;Therapeutic activities;Manual Therapy;DME and/or AE instruction    Plan  Complete Decomgestive Therapy, Intensive and Self-Manageent Phase support: Manual lymphatic drainage (MLD) skin care, ther ex, compression wraps and garments. Consider    Recommended Other Services Fit Pt with BLE, custom, flat knit Jobst Elvarex compression knee highs. Consider Elvarex SOFT, ccl 3 (34-46 mmHg). Consider Jobst RELAX HOS device to limit fibrosis formation. Fit with Flexitouch advanced sequential pneumatic compression device ("PUMP") to aid in daily LE self care at home over time           Patient will benefit from skilled therapeutic intervention in order to improve the  following deficits and impairments:   Body Structure / Function / Physical Skills: ADL, Decreased knowledge of precautions, Mobility, Decreased knowledge of use of DME, Edema, Skin integrity, Pain, IADL       Visit Diagnosis: Lymphedema, not elsewhere classified    Problem List Patient Active Problem List   Diagnosis Date Noted  . Swelling of limb 11/29/2019  . Lymphedema 11/29/2019  . Severe recurrent major depression without psychotic features ([facility]) 03/07/2019  . Suicidal ideation 03/06/2019  . Noncompliance 03/06/2019  . Class 1 obesity due to excess calories without serious comorbidity with body mass index (BMI) of 30.0 to 30.9 in adult 02/11/2017  . History of cervical dysplasia 02/11/2017  . Liver lesion, right lobe 10/17/2016  . Von Willebrand's disease ([facility]) 11/10/2015  . PCOS (polycystic ovarian syndrome) 09/20/2015   Loel Dubonnet, MS, OTR/L, [facility] 06/28/20 9:09 AM   [place] [facility] MAIN [facility] SERVICES [address] [place], [place], [address] Phone: [phone]   Fax:  [phone]  Name: Mallory Johnston MRN: [id number] Date of Birth: Apr 30, 1991

## 2020-07-05 ENCOUNTER — Ambulatory Visit: Payer: BC Managed Care – PPO | Admitting: Occupational Therapy

## 2020-07-05 ENCOUNTER — Other Ambulatory Visit: Payer: Self-pay

## 2020-07-05 DIAGNOSIS — I890 Lymphedema, not elsewhere classified: Secondary | ICD-10-CM

## 2020-07-05 NOTE — Therapy (Signed)
[place] [facility] MAIN [facility] SERVICES [address] [place], [place], [address] Phone: [phone]   Fax:  [phone]  Occupational Therapy Treatment  Patient Details  Name: Mallory Johnston MRN: [id number] Date of Birth: 1991/11/12 Referring Provider (OT): Sheppard Plumber, NP   Encounter Date: 07/05/2020   OT End of Session - 07/05/20 1045    Visit Number 16    Number of Visits 36    Date for OT Re-Evaluation 07/01/20    OT Start Time 0804    OT Stop Time 0858    OT Time Calculation (min) 54 min    Activity Tolerance Patient tolerated treatment well;No increased pain    Behavior During Therapy WFL for tasks assessed/performed           Past Medical History:  Diagnosis Date  . H/O bilateral breast reduction surgery   . Lymphedema of left leg   . Ovarian cyst   . S/P tonsillectomy     Past Surgical History:  Procedure Laterality Date  . BREAST REDUCTION SURGERY    . OVARIAN CYST REMOVAL    . TONSILLECTOMY      There were no vitals filed for this visit.   Subjective Assessment - 07/05/20 7989    Subjective  Mallory Johnston presents for OT visit 15/36 to address BLE lymphedema. Pt presents without compression wraps in place. Pt reports pain in her L ankle of 6/10. Custom compression garments ordered from DME vendor on 7/17 have not yet been delivered.    Pertinent History H/O ovarian cyst, tonsilectomy, B breast reduction; works 2 jobs standing and walking,                        OT Treatments/Exercises (OP) - 07/05/20 0001      ADLs   ADL Education Given Yes      Manual Therapy   Manual Therapy Edema management;Taping;Compression Bandaging;Manual Lymphatic Drainage (MLD)    Manual therapy comments --    Manual Lymphatic Drainage (MLD) LLE MLD utilizing shrt neck sequence, deep abdominal pathway , functionall inguinal LN and UJ strokes to LLE from proximal to distal.    Compression Bandaging LLE knee length ccompression  wrap as established                  OT Education - 07/05/20 1045    Education Details Continued skilled Pt/caregiver education  And LE ADL training throughout visit for lymphedema self care/ home program, including compression wrapping, compression garment and device wear/care, lymphatic pumping ther ex, simple self-MLD, and skin care. Discussed progress towards goals.    Person(s) Educated Patient    Methods Explanation;Demonstration;Handout    Comprehension Verbalized understanding;Returned demonstration;Need further instruction               OT Long Term Goals - 06/07/20 0800      OT LONG TERM GOAL #1   Title Pt will be able to verbalize signs and symptoms of cellulitis infection , and be able to name 6 common lymphedema precautions using printed resource for reference (modified independence)  to limit LE progression over time .    Baseline Max A    Time 5    Period Days    Status Achieved      OT LONG TERM GOAL #2   Title Pt will retain volumetric reduction achieved during Intensive Phase CDT for 6 months after transitioning to Self Management Phase as evidenced by no greater than  3% limb volume increase when measured for follow up.    Baseline Min A    Time 6    Period Months    Status New    Target Date 12/04/20      OT LONG TERM GOAL #3   Title Pt will be able to apply BLE, knee length, multi-layer, short stretch compression wraps using correct gradient techniques with modified independence (extra time) to achieve optimal limb volume reduction, and to return affected limb , as closely as possible, to premorbid size and shape.    Baseline Max A    Time 4    Period Days    Status Achieved      OT LONG TERM GOAL #4   Title Pt will achieve at least a 10% BLE limb volume reductions below knees ( ankle- tibial tuberosity A-D) during Intensive Phase CDT to improve AROM at LE joints for functional activities, ambulation and transfers, to reduce infection risk, and to  limit LE progression.    Baseline Max A    Time 12    Period Weeks    Status On-going   achieved 8.29% decrease on 10th visit 06/07/20     OT LONG TERM GOAL #5   Title Pt will achieve and sustain 85% compliance with daily LE self-care home program components throughout Intensive Phase CDT, including impeccable skin care, lymphatic pumping therex, daily sequential pneumatic pump (one leg per day on alternating legs) and custom compression to ensure optimal limb volume reduction, to limit infection risk and to limit LE progression.    Baseline Max A    Time 12    Period Weeks    Status Achieved      OT LONG TERM GOAL #6   Title Pt will demonstrate modified independence using assistive devices and extra time  during LE self-care training to don and doff properly fitting, custom compression garments/devices with Max A for optimal LE self-management over time.    Baseline Max A    Time 12    Period Days    Status On-going                 Plan - 07/05/20 1046    Clinical Impression Statement Pt reports goood tolerance for kinesiotape to L lateral ankle ligaments (anterior talofibular ligament (ATFL) and/or the calcaneofibular ligament (CFL), but reports tape came off after a few hours and wasnt effective. We'll try kinesiotape again on visit day when Pt is not working afterwards. Pt is retaining clinical gains well with home program . She tolerated MLD and compression wraps to LLE today without increased pain. Cont as per POC. Fit custom gcompression garments as soon as they are delivered.    Occupational performance deficits (Please refer to evaluation for details): ADL's;IADL's;Work;Leisure;Social Participation;Other    Body Structure / Function / Physical Skills ADL;Decreased knowledge of precautions;Mobility;Decreased knowledge of use of DME;Edema;Skin integrity;Pain;IADL    Rehab Potential Good    Clinical Decision Making Several treatment options, min-mod task modification necessary     Comorbidities Affecting Occupational Performance: May have comorbidities impacting occupational performance    Modification or Assistance to Complete Evaluation  No modification of tasks or assist necessary to complete eval    OT Treatment/Interventions Self-care/ADL training;Therapeutic exercise;Manual lymph drainage;Patient/family education;Compression bandaging;Building services engineer;Therapeutic activities;Manual Therapy;DME and/or AE instruction    Plan Complete Decomgestive Therapy, Intensive and Self-Manageent Phase support: Manual lymphatic drainage (MLD) skin care, ther ex, compression wraps and garments. Consider    Recommended Other Services  Fit Pt with BLE, custom, flat knit Jobst Elvarex compression knee highs. Consider Elvarex SOFT, ccl 3 (34-46 mmHg). Consider Jobst RELAX HOS device to limit fibrosis formation. Fit with Flexitouch advanced sequential pneumatic compression device ("PUMP") to aid in daily LE self care at home over time           Patient will benefit from skilled therapeutic intervention in order to improve the following deficits and impairments:   Body Structure / Function / Physical Skills: ADL, Decreased knowledge of precautions, Mobility, Decreased knowledge of use of DME, Edema, Skin integrity, Pain, IADL       Visit Diagnosis: Lymphedema, not elsewhere classified    Problem List Patient Active Problem List   Diagnosis Date Noted  . Swelling of limb 11/29/2019  . Lymphedema 11/29/2019  . Severe recurrent major depression without psychotic features (HCC) 03/07/2019  . Suicidal ideation 03/06/2019  . Noncompliance 03/06/2019  . Class 1 obesity due to excess calories without serious comorbidity with body mass index (BMI) of 30.0 to 30.9 in adult 02/11/2017  . History of cervical dysplasia 02/11/2017  . Liver lesion, right lobe 10/17/2016  . Von Willebrand's disease (HCC) 11/10/2015  . PCOS (polycystic ovarian syndrome) 09/20/2015    Loel Dubonnet, MS, OTR/L, [facility] 07/05/20 10:58 AM  [facility] MAIN [facility] SERVICES [address] [place], [place], [address] Phone: [phone]   Fax:  [phone]  Name: Mallory Johnston MRN: [id number] Date of Birth: [birth year]

## 2020-07-09 ENCOUNTER — Other Ambulatory Visit: Payer: Self-pay

## 2020-07-09 ENCOUNTER — Ambulatory Visit: Payer: BC Managed Care – PPO | Admitting: Occupational Therapy

## 2020-07-09 DIAGNOSIS — I890 Lymphedema, not elsewhere classified: Secondary | ICD-10-CM | POA: Diagnosis not present

## 2020-07-09 NOTE — Therapy (Signed)
[place] [facility] MAIN [facility] SERVICES [address] [place], [place], [address] Phone: [phone]   Fax:  [phone]  Occupational Therapy Treatment  Patient Details  Name: Mallory Johnston MRN: [id number] Date of Birth: February 16, 1991 Referring Provider (OT): Sheppard Plumber, NP   Encounter Date: 07/09/2020   OT End of Session - 07/09/20 1019    Visit Number 17    Number of Visits 36    Date for OT Re-Evaluation 10/07/20    OT Start Time 0808    OT Stop Time 0911    OT Time Calculation (min) 63 min    Activity Tolerance Patient tolerated treatment well;No increased pain    Behavior During Therapy WFL for tasks assessed/performed           Past Medical History:  Diagnosis Date  . H/O bilateral breast reduction surgery   . Lymphedema of left leg   . Ovarian cyst   . S/P tonsillectomy     Past Surgical History:  Procedure Laterality Date  . BREAST REDUCTION SURGERY    . OVARIAN CYST REMOVAL    . TONSILLECTOMY      There were no vitals filed for this visit.   Subjective Assessment - 07/09/20 1017    Subjective  Mallory Johnston presents for OT visit 17/36 to address BLE lymphedema. Pt is 8 minutes late for a 60 minute Rx session. Pt presents without compression wraps in place. Pt reports pain in her L ankle is unchanged. She did not numerically rate pain today. Pt gave verbal permission for volunteer observer to attend session.    Pertinent History H/O ovarian cyst, tonsilectomy, B breast reduction; works 2 jobs standing and walking,                        OT Treatments/Exercises (OP) - 07/09/20 0001      ADLs   ADL Education Given Yes      Manual Therapy   Manual Therapy Edema management;Manual Lymphatic Drainage (MLD);Compression Bandaging;Taping;Other (comment)    Manual therapy comments Kinesiotape to L ankle in  typical pattern for ankle eversion sprain    Manual Lymphatic Drainage (MLD) LLE MLD utilizing shrt neck  sequence, deep abdominal pathway , functionall inguinal LN and UJ strokes to LLE from proximal to distal.    Compression Bandaging LLE knee length ccompression wrap as established    Other Manual Therapy skin care with low ph castor oil for optimal skin stretch during MLD and to restore acidic matrix to skin                  OT Education - 07/09/20 1019    Education Details Continued skilled Pt/caregiver education  And LE ADL training throughout visit for lymphedema self care/ home program, including compression wrapping, compression garment and device wear/care, lymphatic pumping ther ex, simple self-MLD, and skin care. Discussed progress towards goals.    Person(s) Educated Patient    Methods Explanation;Demonstration;Handout    Comprehension Verbalized understanding;Returned demonstration;Need further instruction               OT Long Term Goals - 06/07/20 0800      OT LONG TERM GOAL #1   Title Pt will be able to verbalize signs and symptoms of cellulitis infection , and be able to name 6 common lymphedema precautions using printed resource for reference (modified independence)  to limit LE progression over time .    Baseline Max A  Time 5    Period Days    Status Achieved      OT LONG TERM GOAL #2   Title Pt will retain volumetric reduction achieved during Intensive Phase CDT for 6 months after transitioning to Self Management Phase as evidenced by no greater than 3% limb volume increase when measured for follow up.    Baseline Min A    Time 6    Period Months    Status New    Target Date 12/04/20      OT LONG TERM GOAL #3   Title Pt will be able to apply BLE, knee length, multi-layer, short stretch compression wraps using correct gradient techniques with modified independence (extra time) to achieve optimal limb volume reduction, and to return affected limb , as closely as possible, to premorbid size and shape.    Baseline Max A    Time 4    Period Days     Status Achieved      OT LONG TERM GOAL #4   Title Pt will achieve at least a 10% BLE limb volume reductions below knees ( ankle- tibial tuberosity A-D) during Intensive Phase CDT to improve AROM at LE joints for functional activities, ambulation and transfers, to reduce infection risk, and to limit LE progression.    Baseline Max A    Time 12    Period Weeks    Status On-going   achieved 8.29% decrease on 10th visit 06/07/20     OT LONG TERM GOAL #5   Title Pt will achieve and sustain 85% compliance with daily LE self-care home program components throughout Intensive Phase CDT, including impeccable skin care, lymphatic pumping therex, daily sequential pneumatic pump (one leg per day on alternating legs) and custom compression to ensure optimal limb volume reduction, to limit infection risk and to limit LE progression.    Baseline Max A    Time 12    Period Weeks    Status Achieved      OT LONG TERM GOAL #6   Title Pt will demonstrate modified independence using assistive devices and extra time  during LE self-care training to don and doff properly fitting, custom compression garments/devices with Max A for optimal LE self-management over time.    Baseline Max A    Time 12    Period Days    Status On-going                 Plan - 07/09/20 1025    Clinical Impression Statement MLD as established -proximal to distal- to LLE/LLQ. Kinesiotape to L ankle to stabilize joint and relieve eversion sprain discomfort. Compression wraps from base of toes to popliteal. Rossidal foam is worn out. Replace next visit.  All protocols tolerated well without increased pain. Cont as per POC.Transition to Management Phase CDT and decrease Rx frequency to follow-along after custom garment fitting is complete.    Occupational performance deficits (Please refer to evaluation for details): ADL's;IADL's;Work;Leisure;Social Participation;Other    Body Structure / Function / Physical Skills ADL;Decreased  knowledge of precautions;Mobility;Decreased knowledge of use of DME;Edema;Skin integrity;Pain;IADL    Rehab Potential Good    Clinical Decision Making Several treatment options, min-mod task modification necessary    Comorbidities Affecting Occupational Performance: May have comorbidities impacting occupational performance    Modification or Assistance to Complete Evaluation  No modification of tasks or assist necessary to complete eval    OT Treatment/Interventions Self-care/ADL training;Therapeutic exercise;Manual lymph drainage;Patient/family education;Compression bandaging;Building services engineer;Therapeutic activities;Manual Therapy;DME and/or  AE instruction    Plan Complete Decomgestive Therapy, Intensive and Self-Manageent Phase support: Manual lymphatic drainage (MLD) skin care, ther ex, compression wraps and garments. Consider    Recommended Other Services Fit Pt with BLE, custom, flat knit Jobst Elvarex compression knee highs. Consider Elvarex SOFT, ccl 3 (34-46 mmHg). Consider Jobst RELAX HOS device to limit fibrosis formation. Fit with Flexitouch advanced sequential pneumatic compression device ("PUMP") to aid in daily LE self care at home over time           Patient will benefit from skilled therapeutic intervention in order to improve the following deficits and impairments:   Body Structure / Function / Physical Skills: ADL, Decreased knowledge of precautions, Mobility, Decreased knowledge of use of DME, Edema, Skin integrity, Pain, IADL       Visit Diagnosis: Lymphedema, not elsewhere classified - Plan: Ot plan of care cert/re-cert    Problem List Patient Active Problem List   Diagnosis Date Noted  . Swelling of limb 11/29/2019  . Lymphedema 11/29/2019  . Severe recurrent major depression without psychotic features (HCC) 03/07/2019  . Suicidal ideation 03/06/2019  . Noncompliance 03/06/2019  . Class 1 obesity due to excess calories without serious comorbidity with  body mass index (BMI) of 30.0 to 30.9 in adult 02/11/2017  . History of cervical dysplasia 02/11/2017  . Liver lesion, right lobe 10/17/2016  . Von Willebrand's disease (HCC) 11/10/2015  . PCOS (polycystic ovarian syndrome) 09/20/2015    Loel Dubonnet, MS, OTR/L, [facility] 07/09/20 10:35 AM  [place] [facility] MAIN [facility] SERVICES [address] [place], [place], [address] Phone: [phone]   Fax:  [phone]  Name: Mallory Johnston MRN: [id number] Date of Birth: 18-Jan-1991

## 2020-07-12 ENCOUNTER — Other Ambulatory Visit: Payer: Self-pay

## 2020-07-12 ENCOUNTER — Ambulatory Visit: Payer: BC Managed Care – PPO | Admitting: Occupational Therapy

## 2020-07-12 DIAGNOSIS — I890 Lymphedema, not elsewhere classified: Secondary | ICD-10-CM | POA: Diagnosis not present

## 2020-07-12 NOTE — Therapy (Signed)
[facility] MAIN [facility] SERVICES [address] [place], [place], [address] Phone: [phone]   Fax:  [phone]  Occupational Therapy Treatment  Patient Details  Name: Mallory Johnston MRN: [id number] Date of Birth: Mar 02, 1991 Referring Provider (OT): Sheppard Plumber, NP   Encounter Date: 07/12/2020   OT End of Session - 07/12/20 1242    Visit Number 18    Number of Visits 36    Date for OT Re-Evaluation 10/07/20    OT Start Time 0806    OT Stop Time 0900    OT Time Calculation (min) 54 min    Activity Tolerance Patient tolerated treatment well;No increased pain    Behavior During Therapy WFL for tasks assessed/performed           Past Medical History:  Diagnosis Date  . H/O bilateral breast reduction surgery   . Lymphedema of left leg   . Ovarian cyst   . S/P tonsillectomy     Past Surgical History:  Procedure Laterality Date  . BREAST REDUCTION SURGERY    . OVARIAN CYST REMOVAL    . TONSILLECTOMY      There were no vitals filed for this visit.   Subjective Assessment - 07/12/20 1239    Subjective  Mallory Johnston presents for OT visit 18/36 to address BLE lymphedema. Pt is 6 minutes late for a 60 minute Rx session. Pt presents without compression wraps in place. Pt reports pain in her L ankle is slightly decreased with kinesiotape.Pt declines re-application today.    Pertinent History H/O ovarian cyst, tonsilectomy, B breast reduction; works 2 jobs standing and walking,                        OT Treatments/Exercises (OP) - 07/12/20 0001      ADLs   ADL Education Given Yes      Manual Therapy   Manual Therapy Edema management;Manual Lymphatic Drainage (MLD);Compression Bandaging    Manual Lymphatic Drainage (MLD) LLE MLD utilizing shrt neck sequence, deep abdominal pathway , functionall inguinal LN and UJ strokes to LLE from proximal to distal.    Compression Bandaging LLE knee length ccompression wrap as  established    Other Manual Therapy skin care with low ph castor oil for optimal skin stretch during MLD and to restore acidic matrix to skin                  OT Education - 07/12/20 1242    Education Details Continued skilled Pt/caregiver education  And LE ADL training throughout visit for lymphedema self care/ home program, including compression wrapping, compression garment and device wear/care, lymphatic pumping ther ex, simple self-MLD, and skin care. Discussed progress towards goals.    Person(s) Educated Patient    Methods Explanation;Demonstration;Handout    Comprehension Verbalized understanding;Returned demonstration;Need further instruction               OT Long Term Goals - 06/07/20 0800      OT LONG TERM GOAL #1   Title Pt will be able to verbalize signs and symptoms of cellulitis infection , and be able to name 6 common lymphedema precautions using printed resource for reference (modified independence)  to limit LE progression over time .    Baseline Max A    Time 5    Period Days    Status Achieved      OT LONG TERM GOAL #2   Title Pt will retain  volumetric reduction achieved during Intensive Phase CDT for 6 months after transitioning to Self Management Phase as evidenced by no greater than 3% limb volume increase when measured for follow up.    Baseline Min A    Time 6    Period Months    Status New    Target Date 12/04/20      OT LONG TERM GOAL #3   Title Pt will be able to apply BLE, knee length, multi-layer, short stretch compression wraps using correct gradient techniques with modified independence (extra time) to achieve optimal limb volume reduction, and to return affected limb , as closely as possible, to premorbid size and shape.    Baseline Max A    Time 4    Period Days    Status Achieved      OT LONG TERM GOAL #4   Title Pt will achieve at least a 10% BLE limb volume reductions below knees ( ankle- tibial tuberosity A-D) during Intensive  Phase CDT to improve AROM at LE joints for functional activities, ambulation and transfers, to reduce infection risk, and to limit LE progression.    Baseline Max A    Time 12    Period Weeks    Status On-going   achieved 8.29% decrease on 10th visit 06/07/20     OT LONG TERM GOAL #5   Title Pt will achieve and sustain 85% compliance with daily LE self-care home program components throughout Intensive Phase CDT, including impeccable skin care, lymphatic pumping therex, daily sequential pneumatic pump (one leg per day on alternating legs) and custom compression to ensure optimal limb volume reduction, to limit infection risk and to limit LE progression.    Baseline Max A    Time 12    Period Weeks    Status Achieved      OT LONG TERM GOAL #6   Title Pt will demonstrate modified independence using assistive devices and extra time  during LE self-care training to don and doff properly fitting, custom compression garments/devices with Max A for optimal LE self-management over time.    Baseline Max A    Time 12    Period Days    Status On-going                 Plan - 07/12/20 1243    Clinical Impression Statement Pt tolerated LLE/llQ MLD and knee length gradient compression wraps today without increaded pain. She continues to manage swelling between sessions usoing home program components diligently. Awaiting custom garment delivery and fitting by new DME vendor. Pt agrees to call referring provider to determine if garment perscription has been returned by physician.    Occupational performance deficits (Please refer to evaluation for details): ADL's;IADL's;Work;Leisure;Social Participation;Other    Body Structure / Function / Physical Skills ADL;Decreased knowledge of precautions;Mobility;Decreased knowledge of use of DME;Edema;Skin integrity;Pain;IADL    Rehab Potential Good    Clinical Decision Making Several treatment options, min-mod task modification necessary    Comorbidities  Affecting Occupational Performance: May have comorbidities impacting occupational performance    Modification or Assistance to Complete Evaluation  No modification of tasks or assist necessary to complete eval    OT Treatment/Interventions Self-care/ADL training;Therapeutic exercise;Manual lymph drainage;Patient/family education;Compression bandaging;Building services engineer;Therapeutic activities;Manual Therapy;DME and/or AE instruction    Plan Complete Decomgestive Therapy, Intensive and Self-Manageent Phase support: Manual lymphatic drainage (MLD) skin care, ther ex, compression wraps and garments. Consider    Recommended Other Services Fit Pt with BLE, custom, flat knit  Jobst Elvarex compression knee highs. Consider Elvarex SOFT, ccl 3 (34-46 mmHg). Consider Jobst RELAX HOS device to limit fibrosis formation. Fit with Flexitouch advanced sequential pneumatic compression device ("PUMP") to aid in daily LE self care at home over time           Patient will benefit from skilled therapeutic intervention in order to improve the following deficits and impairments:   Body Structure / Function / Physical Skills: ADL, Decreased knowledge of precautions, Mobility, Decreased knowledge of use of DME, Edema, Skin integrity, Pain, IADL       Visit Diagnosis: Lymphedema, not elsewhere classified    Problem List Patient Active Problem List   Diagnosis Date Noted  . Swelling of limb 11/29/2019  . Lymphedema 11/29/2019  . Severe recurrent major depression without psychotic features (HCC) 03/07/2019  . Suicidal ideation 03/06/2019  . Noncompliance 03/06/2019  . Class 1 obesity due to excess calories without serious comorbidity with body mass index (BMI) of 30.0 to 30.9 in adult 02/11/2017  . History of cervical dysplasia 02/11/2017  . Liver lesion, right lobe 10/17/2016  . Von Willebrand's disease (HCC) 11/10/2015  . PCOS (polycystic ovarian syndrome) 09/20/2015    Loel Dubonnet, MS,  OTR/L, [facility] 07/12/20 12:46 PM   [facility] MAIN [facility] SERVICES [address] [place], [place], [address] Phone: [phone]   Fax:  [phone]  Name: Mallory Johnston MRN: [id number] Date of Birth: 03/27/1991

## 2020-07-16 ENCOUNTER — Ambulatory Visit: Payer: BC Managed Care – PPO | Admitting: Occupational Therapy

## 2020-07-19 ENCOUNTER — Ambulatory Visit: Payer: BC Managed Care – PPO | Admitting: Occupational Therapy

## 2020-07-19 ENCOUNTER — Other Ambulatory Visit: Payer: Self-pay

## 2020-07-19 DIAGNOSIS — I890 Lymphedema, not elsewhere classified: Secondary | ICD-10-CM

## 2020-07-19 NOTE — Therapy (Signed)
[facility] MAIN [facility] SERVICES [address] [place], [place], [address] Phone: [phone]   Fax:  [phone]  Occupational Therapy Treatment  Patient Details  Name: Mallory APPERSON MRN: [id number] Date of Birth: 1991-03-19 Referring Provider (OT): Sheppard Plumber, NP   Encounter Date: 07/19/2020   OT End of Session - 07/19/20 1245    Visit Number 19    Number of Visits 36    Date for OT Re-Evaluation 10/07/20    OT Start Time 0803    OT Stop Time 0910    OT Time Calculation (min) 67 min    Activity Tolerance Patient tolerated treatment well;No increased pain    Behavior During Therapy WFL for tasks assessed/performed           Past Medical History:  Diagnosis Date  . H/O bilateral breast reduction surgery   . Lymphedema of left leg   . Ovarian cyst   . S/P tonsillectomy     Past Surgical History:  Procedure Laterality Date  . BREAST REDUCTION SURGERY    . OVARIAN CYST REMOVAL    . TONSILLECTOMY      There were no vitals filed for this visit.   Subjective Assessment - 07/19/20 0811    Subjective  Mallory Johnston presents for OT visit 19/36 to address BLE lymphedema. Pt presents without compression wraps in place. Pt requests MLD vs volumetric measurements today because, "My foot's not doing good. It's been swollen. I've been on it a lot."    Pertinent History H/O ovarian cyst, tonsilectomy, B breast reduction; works 2 jobs standing and walking,                        OT Treatments/Exercises (OP) - 07/19/20 0001      ADLs   ADL Education Given Yes      Manual Therapy   Manual Therapy Edema management;Manual Lymphatic Drainage (MLD);Compression Bandaging    Manual Lymphatic Drainage (MLD) LLE MLD utilizing shrt neck sequence, deep abdominal pathway , functionall inguinal LN and UJ strokes to LLE from proximal to distal.    Compression Bandaging LLE knee length ccompression wrap as established    Other Manual  Therapy skin care with low ph castor oil for optimal skin stretch during MLD and to restore acidic matrix to skin                  OT Education - 07/19/20 1245    Education Details Continued skilled Pt/caregiver education  And LE ADL training throughout visit for lymphedema self care/ home program, including compression wrapping, compression garment and device wear/care, lymphatic pumping ther ex, simple self-MLD, and skin care. Discussed progress towards goals.    Person(s) Educated Patient    Methods Explanation;Demonstration;Handout    Comprehension Verbalized understanding;Returned demonstration;Need further instruction               OT Long Term Goals - 06/07/20 0800      OT LONG TERM GOAL #1   Title Pt will be able to verbalize signs and symptoms of cellulitis infection , and be able to name 6 common lymphedema precautions using printed resource for reference (modified independence)  to limit LE progression over time .    Baseline Max A    Time 5    Period Days    Status Achieved      OT LONG TERM GOAL #2   Title Pt will retain volumetric reduction achieved during  Intensive Phase CDT for 6 months after transitioning to Self Management Phase as evidenced by no greater than 3% limb volume increase when measured for follow up.    Baseline Min A    Time 6    Period Months    Status New    Target Date 12/04/20      OT LONG TERM GOAL #3   Title Pt will be able to apply BLE, knee length, multi-layer, short stretch compression wraps using correct gradient techniques with modified independence (extra time) to achieve optimal limb volume reduction, and to return affected limb , as closely as possible, to premorbid size and shape.    Baseline Max A    Time 4    Period Days    Status Achieved      OT LONG TERM GOAL #4   Title Pt will achieve at least a 10% BLE limb volume reductions below knees ( ankle- tibial tuberosity A-D) during Intensive Phase CDT to improve AROM at  LE joints for functional activities, ambulation and transfers, to reduce infection risk, and to limit LE progression.    Baseline Max A    Time 12    Period Weeks    Status On-going   achieved 8.29% decrease on 10th visit 06/07/20     OT LONG TERM GOAL #5   Title Pt will achieve and sustain 85% compliance with daily LE self-care home program components throughout Intensive Phase CDT, including impeccable skin care, lymphatic pumping therex, daily sequential pneumatic pump (one leg per day on alternating legs) and custom compression to ensure optimal limb volume reduction, to limit infection risk and to limit LE progression.    Baseline Max A    Time 12    Period Weeks    Status Achieved      OT LONG TERM GOAL #6   Title Pt will demonstrate modified independence using assistive devices and extra time  during LE self-care training to don and doff properly fitting, custom compression garments/devices with Max A for optimal LE self-management over time.    Baseline Max A    Time 12    Period Days    Status On-going                 Plan - 07/19/20 1248    Clinical Impression Statement MLD , skin care and compression wraps  well tolerated today. Pt is doing an excellent job with home program compliance     between visits. Mild ankle swelling remains stubborn, but Pt is sustaining clinical gains. Pt agrees with plan to decrease Rx frequency to 1 x weekly from 2 x weekly wheile awaiting custom compression garment fitting.    Occupational performance deficits (Please refer to evaluation for details): ADL's;IADL's;Work;Leisure;Social Participation;Other    Body Structure / Function / Physical Skills ADL;Decreased knowledge of precautions;Mobility;Decreased knowledge of use of DME;Edema;Skin integrity;Pain;IADL    Rehab Potential Good    Clinical Decision Making Several treatment options, min-mod task modification necessary    Comorbidities Affecting Occupational Performance: May have  comorbidities impacting occupational performance    Modification or Assistance to Complete Evaluation  No modification of tasks or assist necessary to complete eval    OT Treatment/Interventions Self-care/ADL training;Therapeutic exercise;Manual lymph drainage;Patient/family education;Compression bandaging;Building services engineer;Therapeutic activities;Manual Therapy;DME and/or AE instruction    Plan Complete Decomgestive Therapy, Intensive and Self-Manageent Phase support: Manual lymphatic drainage (MLD) skin care, ther ex, compression wraps and garments. Consider    Recommended Other Services Fit Pt with  BLE, custom, flat knit Jobst Elvarex compression knee highs. Consider Elvarex SOFT, ccl 3 (34-46 mmHg). Consider Jobst RELAX HOS device to limit fibrosis formation. Fit with Flexitouch advanced sequential pneumatic compression device ("PUMP") to aid in daily LE self care at home over time           Patient will benefit from skilled therapeutic intervention in order to improve the following deficits and impairments:   Body Structure / Function / Physical Skills: ADL, Decreased knowledge of precautions, Mobility, Decreased knowledge of use of DME, Edema, Skin integrity, Pain, IADL       Visit Diagnosis: Lymphedema, not elsewhere classified    Problem List Patient Active Problem List   Diagnosis Date Noted  . Swelling of limb 11/29/2019  . Lymphedema 11/29/2019  . Severe recurrent major depression without psychotic features (HCC) 03/07/2019  . Suicidal ideation 03/06/2019  . Noncompliance 03/06/2019  . Class 1 obesity due to excess calories without serious comorbidity with body mass index (BMI) of 30.0 to 30.9 in adult 02/11/2017  . History of cervical dysplasia 02/11/2017  . Liver lesion, right lobe 10/17/2016  . Von Willebrand's disease (HCC) 11/10/2015  . PCOS (polycystic ovarian syndrome) 09/20/2015    Loel Dubonnet, MS, OTR/L, [facility] 07/19/20 12:50 PM   [facility] MAIN [facility] SERVICES [address] [place], [place], [address] Phone: [phone]   Fax:  [phone]  Name: Mallory Johnston MRN: [id number] Date of Birth: Aug 23, 1991

## 2020-07-23 ENCOUNTER — Ambulatory Visit: Payer: BC Managed Care – PPO | Admitting: Occupational Therapy

## 2020-07-26 ENCOUNTER — Ambulatory Visit: Payer: BC Managed Care – PPO | Admitting: Occupational Therapy

## 2020-07-26 ENCOUNTER — Other Ambulatory Visit: Payer: Self-pay

## 2020-07-26 ENCOUNTER — Encounter (INDEPENDENT_AMBULATORY_CARE_PROVIDER_SITE_OTHER): Payer: Self-pay

## 2020-07-26 DIAGNOSIS — I890 Lymphedema, not elsewhere classified: Secondary | ICD-10-CM | POA: Diagnosis not present

## 2020-07-26 NOTE — Therapy (Signed)
[place] [facility] MAIN [facility] SERVICES [address] [place], [place], [address] Phone: [phone]   Fax:  [phone]  Occupational Therapy Treatment Note and Progress Report: Lymphedema Care  Patient Details  Name: Mallory Johnston MRN: [id number] Date of Birth: February 04, 1991 Referring Provider (OT): Sheppard Plumber, NP   Encounter Date: 07/26/2020   OT End of Session - 07/26/20 0913    Visit Number 20    Number of Visits 36    Date for OT Re-Evaluation 10/07/20    OT Start Time 0805    OT Stop Time 0907    OT Time Calculation (min) 62 min    Activity Tolerance Patient tolerated treatment well;No increased pain    Behavior During Therapy WFL for tasks assessed/performed           Past Medical History:  Diagnosis Date  . H/O bilateral breast reduction surgery   . Lymphedema of left leg   . Ovarian cyst   . S/P tonsillectomy     Past Surgical History:  Procedure Laterality Date  . BREAST REDUCTION SURGERY    . OVARIAN CYST REMOVAL    . TONSILLECTOMY      There were no vitals filed for this visit.   Subjective Assessment - 07/26/20 0814    Subjective  Harlowe Butchko presents for OT visit 20/36 to address BLE lymphedema. Pt presents without compression wraps in place. Pt reports increased bilateral ankle pain over the last few days @ 9/10. "Yesterday I could barely walk."    Pertinent History H/O ovarian cyst, tonsilectomy, B breast reduction; works 2 jobs standing and walking,    Special Tests +Stemmer L>R                        OT Treatments/Exercises (OP) - 07/26/20 0001      ADLs   ADL Education Given Yes      Manual Therapy   Manual Therapy Edema management;Manual Lymphatic Drainage (MLD);Compression Bandaging    Manual Lymphatic Drainage (MLD) LLE MLD utilizing shrt neck sequence, deep abdominal pathway , functionall inguinal LN and UJ strokes to LLE from proximal to distal.    Compression Bandaging modified bandage  today in effort to increase ankle flexibility and reduce pain. Used single 10 cm wide ace wrap over single layer of Rosidal foam. Still awaiting custom compression garment delivery    Other Manual Therapy skin care with low ph castor oil for optimal skin stretch during MLD and to restore acidic matrix to skin                  OT Education - 07/26/20 0918    Education Details Continued skilled Pt/caregiver education  And LE ADL training throughout visit for lymphedema self care/ home program, including compression wrapping, compression garment and device wear/care, lymphatic pumping ther ex, simple self-MLD, and skin care. Discussed progress towards goals.    Person(s) Educated Patient    Methods Explanation;Demonstration;Handout    Comprehension Verbalized understanding;Returned demonstration;Need further instruction               OT Long Term Goals - 07/26/20 0900      OT LONG TERM GOAL #1   Title Pt will be able to verbalize signs and symptoms of cellulitis infection , and be able to name 6 common lymphedema precautions using printed resource for reference (modified independence)  to limit LE progression over time .    Baseline Max A  Time 5    Period Days    Status Achieved      OT LONG TERM GOAL #2   Title Pt will retain volumetric reduction achieved during Intensive Phase CDT for 6 months after transitioning to Self Management Phase as evidenced by no greater than 3% limb volume increase when measured for follow up.    Baseline Min A    Time 6    Period Months    Status On-going      OT LONG TERM GOAL #3   Title Pt will be able to apply BLE, knee length, multi-layer, short stretch compression wraps using correct gradient techniques with modified independence (extra time) to achieve optimal limb volume reduction, and to return affected limb , as closely as possible, to premorbid size and shape.    Baseline Max A    Time 4    Period Days    Status Achieved       OT LONG TERM GOAL #4   Title Pt will achieve at least a 10% BLE limb volume reductions below knees ( ankle- tibial tuberosity A-D) during Intensive Phase CDT to improve AROM at LE joints for functional activities, ambulation and transfers, to reduce infection risk, and to limit LE progression.    Baseline Max A    Time 12    Period Weeks    Status On-going   achieved 8.29% decrease in LLE volume below the knee to date     OT LONG TERM GOAL #5   Title Pt will achieve and sustain 85% compliance with daily LE self-care home program components throughout Intensive Phase CDT, including impeccable skin care, lymphatic pumping therex, daily sequential pneumatic pump (one leg per day on alternating legs) and custom compression to ensure optimal limb volume reduction, to limit infection risk and to limit LE progression.    Baseline Max A    Time 12    Period Weeks    Status Achieved      OT LONG TERM GOAL #6   Title Pt will demonstrate modified independence using assistive devices and extra time  during LE self-care training to don and doff properly fitting, custom compression garments/devices with Max A for optimal LE self-management over time.    Baseline Max A    Time 12    Period Days    Status On-going                 Plan - 07/26/20 0920    Clinical Impression Statement Provided LLE MLD and compression wrap without increased pain. modified bandage today in effort to increase ankle flexibility and reduce pain. Modified wraps today in effort to allow for increased ankle flexibiity and to reduce pain/ discomfort in joint. (To date Pt has gotten no benefit from Kinesiotape for lateral instability. Today we applied a single 10 cm wide ace wrap over a single layer 0.4 cm thick Rosidal foam anchoring both at base of toes and extending to mid calf. Pt demonstrats progress towards all OT goals fr LE care. She is still awaiting custom compression garment delivery for fitting. Cont as per POC 1 x  weekly until garment fitting is complete, then transition to self management phase of CDT and decrease Rx to follow-along status.    Occupational performance deficits (Please refer to evaluation for details): ADL's;IADL's;Work;Leisure;Social Participation;Other    Body Structure / Function / Physical Skills ADL;Decreased knowledge of precautions;Mobility;Decreased knowledge of use of DME;Edema;Skin integrity;Pain;IADL    Rehab Potential Good  Clinical Decision Making Several treatment options, min-mod task modification necessary    Comorbidities Affecting Occupational Performance: May have comorbidities impacting occupational performance    Modification or Assistance to Complete Evaluation  No modification of tasks or assist necessary to complete eval    OT Treatment/Interventions Self-care/ADL training;Therapeutic exercise;Manual lymph drainage;Patient/family education;Compression bandaging;Building services engineer;Therapeutic activities;Manual Therapy;DME and/or AE instruction    Plan Complete Decomgestive Therapy, Intensive and Self-Manageent Phase support: Manual lymphatic drainage (MLD) skin care, ther ex, compression wraps and garments. Consider    Recommended Other Services Fit Pt with BLE, custom, flat knit Jobst Elvarex compression knee highs. Consider Elvarex SOFT, ccl 3 (34-46 mmHg). Consider Jobst RELAX HOS device to limit fibrosis formation. Fit with Flexitouch advanced sequential pneumatic compression device ("PUMP") to aid in daily LE self care at home over time           Patient will benefit from skilled therapeutic intervention in order to improve the following deficits and impairments:   Body Structure / Function / Physical Skills: ADL, Decreased knowledge of precautions, Mobility, Decreased knowledge of use of DME, Edema, Skin integrity, Pain, IADL       Visit Diagnosis: Lymphedema, not elsewhere classified    Problem List Patient Active Problem List   Diagnosis  Date Noted  . Swelling of limb 11/29/2019  . Lymphedema 11/29/2019  . Severe recurrent major depression without psychotic features (HCC) 03/07/2019  . Suicidal ideation 03/06/2019  . Noncompliance 03/06/2019  . Class 1 obesity due to excess calories without serious comorbidity with body mass index (BMI) of 30.0 to 30.9 in adult 02/11/2017  . History of cervical dysplasia 02/11/2017  . Liver lesion, right lobe 10/17/2016  . Von Willebrand's disease (HCC) 11/10/2015  . PCOS (polycystic ovarian syndrome) 09/20/2015    Loel Dubonnet, MS, OTR/L, [facility] 07/26/20 9:26 AM   [place] [facility] MAIN [facility] SERVICES [address] [place], [place], [address] Phone: [phone]   Fax:  [phone]  Name: CLAUDETT BAYLY MRN: [id number] Date of Birth: [birth year]

## 2020-07-31 ENCOUNTER — Encounter: Payer: Self-pay | Admitting: Obstetrics and Gynecology

## 2020-07-31 ENCOUNTER — Ambulatory Visit: Payer: BC Managed Care – PPO | Admitting: Obstetrics and Gynecology

## 2020-07-31 VITALS — BP 125/84 | HR 92 | Ht 61.0 in | Wt 206.0 lb

## 2020-07-31 DIAGNOSIS — N921 Excessive and frequent menstruation with irregular cycle: Secondary | ICD-10-CM | POA: Diagnosis not present

## 2020-07-31 DIAGNOSIS — R197 Diarrhea, unspecified: Secondary | ICD-10-CM | POA: Diagnosis not present

## 2020-07-31 NOTE — Progress Notes (Signed)
HPI:      [name] is a 29 y.o. G1P0010 who LMP was No LMP recorded.  Subjective:   She presents today after being seen this past weekend at [facility] emergency department for abdominal pain.  Patient states that she is experienced some nausea but after eating mostly has diarrhea.  She states has diarrhea several times per day.  Work-up at [facility] was negative for pelvic abnormality, appendicitis, UTI, pregnancy, STIs etc.  Patient has been taking new OCPs for the last month and has had almost daily spotting.  This is actually an improvement because she was having heavier bleeding with clots in the past.    Hx: The following portions of the patient's history were reviewed and updated as appropriate:             She  has a past medical history of H/O bilateral breast reduction surgery, Lymphedema of left leg, Ovarian cyst, and S/P tonsillectomy. She does not have any pertinent problems on file. She  has a past surgical history that includes Tonsillectomy; Breast reduction surgery; and Ovarian cyst removal. Her family history includes Diabetes in her mother; Hypertension in her father. She  reports that she has never smoked. She has never used smokeless tobacco. She reports current alcohol use. She reports that she does not use drugs. She has a current medication list which includes the following prescription(s): mirtazapine, desogestrel-ethinyl estradiol, naproxen, and trazodone. She is allergic to amoxicillin and percocet [oxycodone-acetaminophen].       Review of Systems:  Review of Systems  Constitutional: Denied constitutional symptoms, night sweats, recent illness, fatigue, fever, insomnia and weight loss.  Eyes: Denied eye symptoms, eye pain, photophobia, vision change and visual disturbance.  Ears/Nose/Throat/Neck: Denied ear, nose, throat or neck symptoms, hearing loss, nasal discharge, sinus congestion and sore throat.  Cardiovascular: Denied cardiovascular symptoms, arrhythmia,  chest pain/pressure, edema, exercise intolerance, orthopnea and palpitations.  Respiratory: Denied pulmonary symptoms, asthma, pleuritic pain, productive sputum, cough, dyspnea and wheezing.  Gastrointestinal: See HPI for additional information.  Genitourinary: Denied genitourinary symptoms including symptomatic vaginal discharge, pelvic relaxation issues, and urinary complaints.  Musculoskeletal: Denied musculoskeletal symptoms, stiffness, swelling, muscle weakness and myalgia.  Dermatologic: Denied dermatology symptoms, rash and scar.  Neurologic: Denied neurology symptoms, dizziness, headache, neck pain and syncope.  Psychiatric: Denied psychiatric symptoms, anxiety and depression.  Endocrine: Denied endocrine symptoms including hot flashes and night sweats.   Meds:   Current Outpatient Medications on File Prior to Visit  Medication Sig Dispense Refill  . mirtazapine (REMERON) 30 MG tablet Take 1 tablet (30 mg total) by mouth at bedtime. 30 tablet 0  . desogestrel-ethinyl estradiol (MIRCETTE) 0.15-0.02/0.01 MG (21/5) tablet Take 1 tablet by mouth at bedtime. (Patient not taking: Reported on 07/31/2020) 84 tablet 3  . naproxen (NAPROSYN) 500 MG tablet Take 1 tablet (500 mg total) by mouth 2 (two) times daily. (Patient not taking: Reported on 07/31/2020) 60 tablet 0  . traZODone (DESYREL) 100 MG tablet TAKE 1 TABLET (100 MG TOTAL) BY MOUTH AT BEDTIME AS NEEDED FOR SLEEP. (Patient not taking: Reported on 07/31/2020) 30 tablet 0   No current facility-administered medications on file prior to visit.    Objective:     Vitals:   07/31/20 0950  BP: 125/84  Pulse: 92              Emergency Department labs and imaging reviewed directly with the patient  Assessment:    G1P0010 Patient Active Problem List   Diagnosis  Date Noted  . Swelling of limb 11/29/2019  . Lymphedema 11/29/2019  . Severe recurrent major depression without psychotic features (HCC) 03/07/2019  . Suicidal ideation  03/06/2019  . Noncompliance 03/06/2019  . Class 1 obesity due to excess calories without serious comorbidity with body mass index (BMI) of 30.0 to 30.9 in adult 02/11/2017  . History of cervical dysplasia 02/11/2017  . Liver lesion, right lobe 10/17/2016  . Von Willebrand's disease (HCC) 11/10/2015  . PCOS (polycystic ovarian syndrome) 09/20/2015     1. Diarrhea, unspecified type   2. Breakthrough bleeding on birth control pills        Plan:            1.  Discussed use of Imodium AD  2.  Continue OCPs-expect spotting to resolve.  3.  Discussed possibility of GI consult as most of her symptoms seem GI related.  Patient to contact us if she desires GI referral. Orders No orders of the defined types were placed in this encounter.   No orders of the defined types were placed in this encounter.     F/U  Return for Pt to contact us if symptoms worsen. I spent 22 minutes involved in the care of this patient preparing to see the patient by obtaining and reviewing her medical history (including labs, imaging tests and prior procedures), documenting clinical information in the electronic health record (EHR), counseling and coordinating care plans, writing and sending prescriptions, ordering tests or procedures and directly communicating with the patient by discussing pertinent items from her history and physical exam as well as detailing my assessment and plan as noted above so that she has an informed understanding.  All of her questions were answered.  Elonda Husky, M.D. 07/31/2020 10:19 AM

## 2020-08-02 ENCOUNTER — Ambulatory Visit: Payer: BC Managed Care – PPO | Attending: Nurse Practitioner | Admitting: Occupational Therapy

## 2020-08-02 ENCOUNTER — Other Ambulatory Visit: Payer: Self-pay

## 2020-08-02 DIAGNOSIS — I890 Lymphedema, not elsewhere classified: Secondary | ICD-10-CM

## 2020-08-02 NOTE — Therapy (Signed)
[place] [facility] MAIN [facility] SERVICES [address] [place], [place], [address] Phone: [phone]   Fax:  [phone]  Occupational Therapy Treatment  Patient Details  Name: Mallory Johnston MRN: [id number] Date of Birth: 04-18-1991 Referring Provider (OT): Sheppard Plumber, NP   Encounter Date: 08/02/2020   OT End of Session - 08/02/20 1325    Visit Number 21    Number of Visits 36    Date for OT Re-Evaluation 10/07/20    OT Start Time 0908    OT Stop Time 1008    OT Time Calculation (min) 60 min    Activity Tolerance Patient tolerated treatment well;No increased pain    Behavior During Therapy WFL for tasks assessed/performed           Past Medical History:  Diagnosis Date  . H/O bilateral breast reduction surgery   . Lymphedema of left leg   . Ovarian cyst   . S/P tonsillectomy     Past Surgical History:  Procedure Laterality Date  . BREAST REDUCTION SURGERY    . OVARIAN CYST REMOVAL    . TONSILLECTOMY      There were no vitals filed for this visit.   Subjective Assessment - 08/02/20 0918    Subjective  Mallory Johnston presents for OT visit 21/36 to address BLE lymphedema. Pt presents without compression wraps in place. Pt denies leg pain this morning, but reports difficulty sleeping and decreased appetite since visiting the ED over the weekend due to concern for msicarriage. Pt reports she spoke with DME vendor late last week and is working on funding for [employer].    Pertinent History H/O ovarian cyst, tonsilectomy, B breast reduction; works 2 jobs standing and walking,    Limitations chronic leg swelling and associated pain, difficulty walking. impaired standing tolerance, dependent sitting, car transfers,  impaired body image,    Special Tests +Stemmer L>R    Currently in Pain? No/denies                        OT Treatments/Exercises (OP) - 08/02/20 0001      ADLs   ADL Education Given Yes      Manual Therapy    Manual Therapy Edema management;Manual Lymphatic Drainage (MLD);Compression Bandaging    Manual Lymphatic Drainage (MLD) LLE MLD utilizing shrt neck sequence, deep abdominal pathway , functionall inguinal LN and UJ strokes to LLE from proximal to distal.    Compression Bandaging modified bandage today in effort to increase ankle flexibility and reduce pain. Used single 10 cm wide ace wrap over single layer of Rosidal foam. Still awaiting custom compression garment delivery    Other Manual Therapy skin care with low ph castor oil for optimal skin stretch during MLD and to restore acidic matrix to skin                  OT Education - 08/02/20 1324    Education Details Continued skilled Pt/caregiver education  And LE ADL training throughout visit for lymphedema self care/ home program, including compression wrapping, compression garment and device wear/care, lymphatic pumping ther ex, simple self-MLD, and skin care. Discussed progress towards goals.    Person(s) Educated Patient    Methods Explanation;Demonstration;Handout    Comprehension Verbalized understanding;Returned demonstration;Need further instruction               OT Long Term Goals - 07/26/20 0900      OT LONG TERM GOAL #  1   Title Pt will be able to verbalize signs and symptoms of cellulitis infection , and be able to name 6 common lymphedema precautions using printed resource for reference (modified independence)  to limit LE progression over time .    Baseline Max A    Time 5    Period Days    Status Achieved      OT LONG TERM GOAL #2   Title Pt will retain volumetric reduction achieved during Intensive Phase CDT for 6 months after transitioning to Self Management Phase as evidenced by no greater than 3% limb volume increase when measured for follow up.    Baseline Min A    Time 6    Period Months    Status On-going      OT LONG TERM GOAL #3   Title Pt will be able to apply BLE, knee length, multi-layer, short  stretch compression wraps using correct gradient techniques with modified independence (extra time) to achieve optimal limb volume reduction, and to return affected limb , as closely as possible, to premorbid size and shape.    Baseline Max A    Time 4    Period Days    Status Achieved      OT LONG TERM GOAL #4   Title Pt will achieve at least a 10% BLE limb volume reductions below knees ( ankle- tibial tuberosity A-D) during Intensive Phase CDT to improve AROM at LE joints for functional activities, ambulation and transfers, to reduce infection risk, and to limit LE progression.    Baseline Max A    Time 12    Period Weeks    Status On-going   achieved 8.29% decrease in LLE volume below the knee to date     OT LONG TERM GOAL #5   Title Pt will achieve and sustain 85% compliance with daily LE self-care home program components throughout Intensive Phase CDT, including impeccable skin care, lymphatic pumping therex, daily sequential pneumatic pump (one leg per day on alternating legs) and custom compression to ensure optimal limb volume reduction, to limit infection risk and to limit LE progression.    Baseline Max A    Time 12    Period Weeks    Status Achieved      OT LONG TERM GOAL #6   Title Pt will demonstrate modified independence using assistive devices and extra time  during LE self-care training to don and doff properly fitting, custom compression garments/devices with Max A for optimal LE self-management over time.    Baseline Max A    Time 12    Period Days    Status On-going                 Plan - 08/02/20 1326    Clinical Impression Statement Provided LLE/LLQ MLD, skin care and compression wraps as established without increased  pain. Awaiting delivery of custom cmpression garments/ devices. These are delayed as Pt has not yet paid co-pay. Cont as per POC. Fit garments ASAP.    Occupational performance deficits (Please refer to evaluation for details):  ADL's;IADL's;Work;Leisure;Social Participation;Other    Body Structure / Function / Physical Skills ADL;Decreased knowledge of precautions;Mobility;Decreased knowledge of use of DME;Edema;Skin integrity;Pain;IADL    Rehab Potential Good    Clinical Decision Making Several treatment options, min-mod task modification necessary    Comorbidities Affecting Occupational Performance: May have comorbidities impacting occupational performance    Modification or Assistance to Complete Evaluation  No modification of tasks or  assist necessary to complete eval    OT Treatment/Interventions Self-care/ADL training;Therapeutic exercise;Manual lymph drainage;Patient/family education;Compression bandaging;Building services engineer;Therapeutic activities;Manual Therapy;DME and/or AE instruction    Plan Complete Decomgestive Therapy, Intensive and Self-Manageent Phase support: Manual lymphatic drainage (MLD) skin care, ther ex, compression wraps and garments. Consider    Recommended Other Services Fit Pt with BLE, custom, flat knit Jobst Elvarex compression knee highs. Consider Elvarex SOFT, ccl 3 (34-46 mmHg). Consider Jobst RELAX HOS device to limit fibrosis formation. Fit with Flexitouch advanced sequential pneumatic compression device ("PUMP") to aid in daily LE self care at home over time           Patient will benefit from skilled therapeutic intervention in order to improve the following deficits and impairments:   Body Structure / Function / Physical Skills: ADL, Decreased knowledge of precautions, Mobility, Decreased knowledge of use of DME, Edema, Skin integrity, Pain, IADL       Visit Diagnosis: Lymphedema, not elsewhere classified    Problem List Patient Active Problem List   Diagnosis Date Noted  . Swelling of limb 11/29/2019  . Lymphedema 11/29/2019  . Severe recurrent major depression without psychotic features (HCC) 03/07/2019  . Suicidal ideation 03/06/2019  . Noncompliance  03/06/2019  . Class 1 obesity due to excess calories without serious comorbidity with body mass index (BMI) of 30.0 to 30.9 in adult 02/11/2017  . History of cervical dysplasia 02/11/2017  . Liver lesion, right lobe 10/17/2016  . Von Willebrand's disease (HCC) 11/10/2015  . PCOS (polycystic ovarian syndrome) 09/20/2015   Loel Dubonnet, MS, OTR/L, [employer] 08/02/20 1:28 PM   [place] [facility] MAIN [facility] SERVICES [address] [place], [place], [address] Phone: [phone]   Fax:  [phone]  Name: Mallory Johnston MRN: [id number] Date of Birth: 1991-04-08

## 2020-08-06 ENCOUNTER — Encounter: Payer: BC Managed Care – PPO | Admitting: Occupational Therapy

## 2020-08-08 ENCOUNTER — Ambulatory Visit: Payer: BC Managed Care – PPO | Admitting: Occupational Therapy

## 2020-08-13 ENCOUNTER — Ambulatory Visit: Payer: BC Managed Care – PPO | Admitting: Occupational Therapy

## 2020-08-15 ENCOUNTER — Ambulatory Visit: Payer: BC Managed Care – PPO | Admitting: Occupational Therapy

## 2020-08-16 ENCOUNTER — Ambulatory Visit: Payer: BC Managed Care – PPO | Admitting: Occupational Therapy

## 2020-08-16 ENCOUNTER — Other Ambulatory Visit: Payer: Self-pay

## 2020-08-16 DIAGNOSIS — I890 Lymphedema, not elsewhere classified: Secondary | ICD-10-CM | POA: Diagnosis not present

## 2020-08-16 NOTE — Therapy (Signed)
[facility] MAIN [facility] SERVICES [address] [place], [place], [address] Phone: [phone]   Fax:  [phone]  Occupational Therapy Treatment  Patient Details  Name: Mallory Johnston MRN: [id number] Date of Birth: 1991/10/28 Referring Provider (OT): Sheppard Plumber, NP   Encounter Date: 08/16/2020   OT End of Session - 08/16/20 1424    Visit Number 22    Number of Visits 36    Date for OT Re-Evaluation 10/07/20    OT Start Time 0100    OT Stop Time 0200    OT Time Calculation (min) 60 min    Activity Tolerance Patient tolerated treatment well;No increased pain    Behavior During Therapy WFL for tasks assessed/performed           Past Medical History:  Diagnosis Date  . H/O bilateral breast reduction surgery   . Lymphedema of left leg   . Ovarian cyst   . S/P tonsillectomy     Past Surgical History:  Procedure Laterality Date  . BREAST REDUCTION SURGERY    . OVARIAN CYST REMOVAL    . TONSILLECTOMY      There were no vitals filed for this visit.   Subjective Assessment - 08/16/20 1421    Subjective  Mallory Johnston presents for OT visit 22/36 to address BLE lymphedema. Pt presents without compression wraps in place. Pt missed last appointment due to scheduling conflict. Pt has no new complaints or concerns. She has not yet contacted DME vendor who has left several VM for her regarding her ciustom compression garments.    Pertinent History H/O ovarian cyst, tonsilectomy, B breast reduction; works 2 jobs standing and walking,    Limitations chronic leg swelling and associated pain, difficulty walking. impaired standing tolerance, dependent sitting, car transfers,  impaired body image,    Special Tests +Stemmer L>R                        OT Treatments/Exercises (OP) - 08/16/20 0001      ADLs   ADL Education Given Yes      Manual Therapy   Manual Therapy Edema management;Manual Lymphatic Drainage (MLD);Compression  Bandaging    Manual Lymphatic Drainage (MLD) LLE MLD utilizing shrt neck sequence, deep abdominal pathway , functionall inguinal LN and UJ strokes to LLE from proximal to distal.    Compression Bandaging modified bandage today in effort to increase ankle flexibility and reduce pain. Used single 10 cm wide ace wrap over single layer of Rosidal foam. Still awaiting custom compression garment delivery    Other Manual Therapy skin care with low ph castor oil for optimal skin stretch during MLD and to restore acidic matrix to skin                  OT Education - 08/16/20 1424    Education Details Continued skilled Pt/caregiver education  And LE ADL training throughout visit for lymphedema self care/ home program, including compression wrapping, compression garment and device wear/care, lymphatic pumping ther ex, simple self-MLD, and skin care. Discussed progress towards goals.    Person(s) Educated Patient    Methods Explanation;Demonstration;Handout    Comprehension Verbalized understanding;Returned demonstration;Need further instruction               OT Long Term Goals - 07/26/20 0900      OT LONG TERM GOAL #1   Title Pt will be able to verbalize signs and symptoms of cellulitis infection ,  and be able to name 6 common lymphedema precautions using printed resource for reference (modified independence)  to limit LE progression over time .    Baseline Max A    Time 5    Period Days    Status Achieved      OT LONG TERM GOAL #2   Title Pt will retain volumetric reduction achieved during Intensive Phase CDT for 6 months after transitioning to Self Management Phase as evidenced by no greater than 3% limb volume increase when measured for follow up.    Baseline Min A    Time 6    Period Months    Status On-going      OT LONG TERM GOAL #3   Title Pt will be able to apply BLE, knee length, multi-layer, short stretch compression wraps using correct gradient techniques with modified  independence (extra time) to achieve optimal limb volume reduction, and to return affected limb , as closely as possible, to premorbid size and shape.    Baseline Max A    Time 4    Period Days    Status Achieved      OT LONG TERM GOAL #4   Title Pt will achieve at least a 10% BLE limb volume reductions below knees ( ankle- tibial tuberosity A-D) during Intensive Phase CDT to improve AROM at LE joints for functional activities, ambulation and transfers, to reduce infection risk, and to limit LE progression.    Baseline Max A    Time 12    Period Weeks    Status On-going   achieved 8.29% decrease in LLE volume below the knee to date     OT LONG TERM GOAL #5   Title Pt will achieve and sustain 85% compliance with daily LE self-care home program components throughout Intensive Phase CDT, including impeccable skin care, lymphatic pumping therex, daily sequential pneumatic pump (one leg per day on alternating legs) and custom compression to ensure optimal limb volume reduction, to limit infection risk and to limit LE progression.    Baseline Max A    Time 12    Period Weeks    Status Achieved      OT LONG TERM GOAL #6   Title Pt will demonstrate modified independence using assistive devices and extra time  during LE self-care training to don and doff properly fitting, custom compression garments/devices with Max A for optimal LE self-management over time.    Baseline Max A    Time 12    Period Days    Status On-going                 Plan - 08/16/20 1425    Clinical Impression Statement L foot more swollen today than has been typical. Pt has not yet returned call from DME vendor re ordering recommended compression garment. Vendor has called Pt several times. Pt encouraged to request bvendor run her benefits again as she has had several new medical expenses lately that may satisfy her copay. Pt agrees to call vendor after our session today. Pt tolerated MLD, skin are and multilayer  gradient compression wraps without increased pain today. In the interest of moving Pt forward towards self management phase of care, If patient hyas not contacted vendor about recommended compression garments for daytime use withing next 3 visits we will pause OT for CDT and resume once garments are delivered.    Occupational performance deficits (Please refer to evaluation for details): ADL's;IADL's;Work;Leisure;Social Participation;Other    Body Structure /  Function / Physical Skills ADL;Decreased knowledge of precautions;Mobility;Decreased knowledge of use of DME;Edema;Skin integrity;Pain;IADL    Rehab Potential Good    Clinical Decision Making Several treatment options, min-mod task modification necessary    Comorbidities Affecting Occupational Performance: May have comorbidities impacting occupational performance    Modification or Assistance to Complete Evaluation  No modification of tasks or assist necessary to complete eval    OT Treatment/Interventions Self-care/ADL training;Therapeutic exercise;Manual lymph drainage;Patient/family education;Compression bandaging;Building services engineer;Therapeutic activities;Manual Therapy;DME and/or AE instruction    Plan Complete Decomgestive Therapy, Intensive and Self-Manageent Phase support: Manual lymphatic drainage (MLD) skin care, ther ex, compression wraps and garments. Consider    Recommended Other Services Fit Pt with BLE, custom, flat knit Jobst Elvarex compression knee highs. Consider Elvarex SOFT, ccl 3 (34-46 mmHg). Consider Jobst RELAX HOS device to limit fibrosis formation. Fit with Flexitouch advanced sequential pneumatic compression device ("PUMP") to aid in daily LE self care at home over time           Patient will benefit from skilled therapeutic intervention in order to improve the following deficits and impairments:   Body Structure / Function / Physical Skills: ADL, Decreased knowledge of precautions, Mobility, Decreased  knowledge of use of DME, Edema, Skin integrity, Pain, IADL       Visit Diagnosis: Lymphedema, not elsewhere classified    Problem List Patient Active Problem List   Diagnosis Date Noted  . Swelling of limb 11/29/2019  . Lymphedema 11/29/2019  . Severe recurrent major depression without psychotic features (HCC) 03/07/2019  . Suicidal ideation 03/06/2019  . Noncompliance 03/06/2019  . Class 1 obesity due to excess calories without serious comorbidity with body mass index (BMI) of 30.0 to 30.9 in adult 02/11/2017  . History of cervical dysplasia 02/11/2017  . Liver lesion, right lobe 10/17/2016  . Von Willebrand's disease (HCC) 11/10/2015  . PCOS (polycystic ovarian syndrome) 09/20/2015    Loel Dubonnet, MS, OTR/L, [facility] 08/16/20 2:31 PM   [facility] [facility] MAIN [facility] SERVICES [address] [place], [place], [address] Phone: [phone]   Fax:  [phone]  Name: Mallory Johnston MRN: [id number] Date of Birth: [birth year]

## 2020-08-20 ENCOUNTER — Ambulatory Visit: Payer: BC Managed Care – PPO | Admitting: Occupational Therapy

## 2020-08-22 ENCOUNTER — Ambulatory Visit: Payer: BC Managed Care – PPO | Admitting: Occupational Therapy

## 2020-08-27 ENCOUNTER — Ambulatory Visit: Payer: BC Managed Care – PPO | Admitting: Occupational Therapy

## 2020-08-29 ENCOUNTER — Ambulatory Visit: Payer: BC Managed Care – PPO | Admitting: Occupational Therapy

## 2020-08-30 ENCOUNTER — Ambulatory Visit: Payer: BC Managed Care – PPO | Admitting: Occupational Therapy

## 2020-09-06 ENCOUNTER — Other Ambulatory Visit: Payer: Self-pay

## 2020-09-06 ENCOUNTER — Ambulatory Visit: Payer: BC Managed Care – PPO | Attending: Nurse Practitioner | Admitting: Occupational Therapy

## 2020-09-06 DIAGNOSIS — I890 Lymphedema, not elsewhere classified: Secondary | ICD-10-CM | POA: Insufficient documentation

## 2020-09-06 NOTE — Therapy (Signed)
[place] [facility] MAIN [facility] SERVICES [address] [place], [place], [address] Phone: [phone]   Fax:  [phone]  Occupational Therapy Treatment  Patient Details  Name: Mallory Johnston MRN: [id number] Date of Birth: 1991/06/20 Referring Provider (OT): Sheppard Plumber, NP   Encounter Date: 09/06/2020   OT End of Session - 09/06/20 0901    Visit Number 23    Number of Visits 36    Date for OT Re-Evaluation 10/07/20    OT Start Time 0800    OT Stop Time 0859    OT Time Calculation (min) 59 min    Activity Tolerance Patient tolerated treatment well;No increased pain    Behavior During Therapy WFL for tasks assessed/performed           Past Medical History:  Diagnosis Date  . H/O bilateral breast reduction surgery   . Lymphedema of left leg   . Ovarian cyst   . S/P tonsillectomy     Past Surgical History:  Procedure Laterality Date  . BREAST REDUCTION SURGERY    . OVARIAN CYST REMOVAL    . TONSILLECTOMY      There were no vitals filed for this visit.                 OT Treatments/Exercises (OP) - 09/06/20 0001      ADLs   ADL Education Given Yes      Manual Therapy   Manual Therapy Edema management;Manual Lymphatic Drainage (MLD);Compression Bandaging    Manual Lymphatic Drainage (MLD) LLE MLD utilizing shrt neck sequence, deep abdominal pathway , functionall inguinal LN and UJ strokes to LLE from proximal to distal.    Compression Bandaging modified bandage today in effort to increase ankle flexibility and reduce pain. Used single 10 cm wide ace wrap over single layer of Rosidal foam. Still awaiting custom compression garment delivery    Other Manual Therapy skin care with low ph castor oil for optimal skin stretch during MLD and to restore acidic matrix to skin                  OT Education - 09/06/20 0902    Education Details Continued skilled Pt/caregiver education  And LE ADL training throughout visit for  lymphedema self care/ home program, including compression wrapping, compression garment and device wear/care, lymphatic pumping ther ex, simple self-MLD, and skin care. Discussed progress towards goals.    Person(s) Educated Patient    Methods Explanation;Demonstration;Handout    Comprehension Verbalized understanding;Returned demonstration;Need further instruction               OT Long Term Goals - 07/26/20 0900      OT LONG TERM GOAL #1   Title Pt will be able to verbalize signs and symptoms of cellulitis infection , and be able to name 6 common lymphedema precautions using printed resource for reference (modified independence)  to limit LE progression over time .    Baseline Max A    Time 5    Period Days    Status Achieved      OT LONG TERM GOAL #2   Title Pt will retain volumetric reduction achieved during Intensive Phase CDT for 6 months after transitioning to Self Management Phase as evidenced by no greater than 3% limb volume increase when measured for follow up.    Baseline Min A    Time 6    Period Months    Status On-going  OT LONG TERM GOAL #3   Title Pt will be able to apply BLE, knee length, multi-layer, short stretch compression wraps using correct gradient techniques with modified independence (extra time) to achieve optimal limb volume reduction, and to return affected limb , as closely as possible, to premorbid size and shape.    Baseline Max A    Time 4    Period Days    Status Achieved      OT LONG TERM GOAL #4   Title Pt will achieve at least a 10% BLE limb volume reductions below knees ( ankle- tibial tuberosity A-D) during Intensive Phase CDT to improve AROM at LE joints for functional activities, ambulation and transfers, to reduce infection risk, and to limit LE progression.    Baseline Max A    Time 12    Period Weeks    Status On-going   achieved 8.29% decrease in LLE volume below the knee to date     OT LONG TERM GOAL #5   Title Pt will  achieve and sustain 85% compliance with daily LE self-care home program components throughout Intensive Phase CDT, including impeccable skin care, lymphatic pumping therex, daily sequential pneumatic pump (one leg per day on alternating legs) and custom compression to ensure optimal limb volume reduction, to limit infection risk and to limit LE progression.    Baseline Max A    Time 12    Period Weeks    Status Achieved      OT LONG TERM GOAL #6   Title Pt will demonstrate modified independence using assistive devices and extra time  during LE self-care training to don and doff properly fitting, custom compression garments/devices with Max A for optimal LE self-management over time.    Baseline Max A    Time 12    Period Days    Status On-going                 Plan - 09/06/20 0902    Clinical Impression Statement Pt last seen 8/19. LLE swelling appears well controlled today. Pt continues to have mild swelling and some pain in ankleand foot. She has not yet received her custom compression garment. OT emailed DME vendor in an effort to expedite communication re garment status. Pt tolerated MLD, skin care and compression wrapping today without difficulty. Cont as per POC.    Occupational performance deficits (Please refer to evaluation for details): ADL's;IADL's;Work;Leisure;Social Participation;Other    Body Structure / Function / Physical Skills ADL;Decreased knowledge of precautions;Mobility;Decreased knowledge of use of DME;Edema;Skin integrity;Pain;IADL    Rehab Potential Good    Clinical Decision Making Several treatment options, min-mod task modification necessary    Comorbidities Affecting Occupational Performance: May have comorbidities impacting occupational performance    Modification or Assistance to Complete Evaluation  No modification of tasks or assist necessary to complete eval    OT Treatment/Interventions Self-care/ADL training;Therapeutic exercise;Manual lymph  drainage;Patient/family education;Compression bandaging;Building services engineer;Therapeutic activities;Manual Therapy;DME and/or AE instruction    Plan Complete Decomgestive Therapy, Intensive and Self-Manageent Phase support: Manual lymphatic drainage (MLD) skin care, ther ex, compression wraps and garments. Consider    Recommended Other Services Fit Pt with BLE, custom, flat knit Jobst Elvarex compression knee highs. Consider Elvarex SOFT, ccl 3 (34-46 mmHg). Consider Jobst RELAX HOS device to limit fibrosis formation. Fit with Flexitouch advanced sequential pneumatic compression device ("PUMP") to aid in daily LE self care at home over time           Patient  will benefit from skilled therapeutic intervention in order to improve the following deficits and impairments:   Body Structure / Function / Physical Skills: ADL, Decreased knowledge of precautions, Mobility, Decreased knowledge of use of DME, Edema, Skin integrity, Pain, IADL       Visit Diagnosis: Lymphedema, not elsewhere classified    Problem List Patient Active Problem List   Diagnosis Date Noted  . Swelling of limb 11/29/2019  . Lymphedema 11/29/2019  . Severe recurrent major depression without psychotic features (HCC) 03/07/2019  . Suicidal ideation 03/06/2019  . Noncompliance 03/06/2019  . Class 1 obesity due to excess calories without serious comorbidity with body mass index (BMI) of 30.0 to 30.9 in adult 02/11/2017  . History of cervical dysplasia 02/11/2017  . Liver lesion, right lobe 10/17/2016  . Von Willebrand's disease (HCC) 11/10/2015  . PCOS (polycystic ovarian syndrome) 09/20/2015    Loel Dubonnet, MS, OTR/L, [facility] 09/06/20 9:05 AM  [place] [facility] MAIN [facility] SERVICES [address] [place], [place], [address] Phone: [phone]   Fax:  [phone]  Name: Mallory Johnston MRN: [id number] Date of Birth: 09-01-1991

## 2020-09-07 ENCOUNTER — Ambulatory Visit (INDEPENDENT_AMBULATORY_CARE_PROVIDER_SITE_OTHER): Payer: 59 | Admitting: Vascular Surgery

## 2020-09-11 ENCOUNTER — Other Ambulatory Visit: Payer: Self-pay

## 2020-09-11 ENCOUNTER — Encounter (INDEPENDENT_AMBULATORY_CARE_PROVIDER_SITE_OTHER): Payer: Self-pay | Admitting: Vascular Surgery

## 2020-09-11 ENCOUNTER — Ambulatory Visit (INDEPENDENT_AMBULATORY_CARE_PROVIDER_SITE_OTHER): Payer: BC Managed Care – PPO | Admitting: Vascular Surgery

## 2020-09-11 VITALS — BP 124/83 | HR 82 | Ht 61.0 in | Wt 205.0 lb

## 2020-09-11 DIAGNOSIS — I890 Lymphedema, not elsewhere classified: Secondary | ICD-10-CM | POA: Diagnosis not present

## 2020-09-11 DIAGNOSIS — Z6830 Body mass index (BMI) 30.0-30.9, adult: Secondary | ICD-10-CM | POA: Diagnosis not present

## 2020-09-11 DIAGNOSIS — E6609 Other obesity due to excess calories: Secondary | ICD-10-CM

## 2020-09-11 DIAGNOSIS — M7989 Other specified soft tissue disorders: Secondary | ICD-10-CM

## 2020-09-11 NOTE — Assessment & Plan Note (Signed)
The patient has been going to the lymphedema clinic and has improved with manual massage and therapy.  They are doing a great job.  Despite this, her left leg continues to have some swelling.  Given her young age and the chronic nature of the situation, she is interested in doing everything she can to exhaust her options for improvement.  On that note, I think a venogram of the left lower extremity to evaluate for a may Thurner situation is a reasonable option can be done in the near future at the patient's convenience.  Risks and benefits are discussed.  She is agreeable to proceed. 

## 2020-09-11 NOTE — Progress Notes (Signed)
MRN : [id number]  Mallory Johnston is a 29 y.o. (09/06/1991) female who presents with chief complaint of  Chief Complaint  Patient presents with  . Follow-up    6 mo follow up  .  History of Present Illness: Patient returns today in follow up of her left leg swelling.  Since April, she has been going to the lymphedema clinic and they have done an excellent job with wrapping and manual massage.  Her swelling is down although the left lower extremity swelling particular around the foot and ankle still persists.  This is frustrating.  She really has done a good job of wearing her compression, elevating her legs, and doing all of the therapies that are asked of her.  Current Outpatient Medications  Medication Sig Dispense Refill  . desogestrel-ethinyl estradiol (MIRCETTE) 0.15-0.02/0.01 MG (21/5) tablet Take 1 tablet by mouth at bedtime. 84 tablet 3  . mirtazapine (REMERON) 30 MG tablet Take 1 tablet (30 mg total) by mouth at bedtime. 30 tablet 0  . naproxen (NAPROSYN) 500 MG tablet Take 1 tablet (500 mg total) by mouth 2 (two) times daily. (Patient not taking: Reported on 07/31/2020) 60 tablet 0  . traZODone (DESYREL) 100 MG tablet TAKE 1 TABLET (100 MG TOTAL) BY MOUTH AT BEDTIME AS NEEDED FOR SLEEP. (Patient not taking: Reported on 07/31/2020) 30 tablet 0   No current facility-administered medications for this visit.    Past Medical History:  Diagnosis Date  . H/O bilateral breast reduction surgery   . Lymphedema of left leg   . Ovarian cyst   . S/P tonsillectomy     Past Surgical History:  Procedure Laterality Date  . BREAST REDUCTION SURGERY    . OVARIAN CYST REMOVAL    . TONSILLECTOMY       Social History   Tobacco Use  . Smoking status: Never Smoker  . Smokeless tobacco: Never Used  Vaping Use  . Vaping Use: Never used  Substance Use Topics  . Alcohol use: Yes    Comment: rare  . Drug use: No     Family History  Problem Relation Age of Onset  . Hypertension  Father   . Diabetes Mother   . Breast cancer Neg Hx   . Ovarian cancer Neg Hx   . Colon cancer Neg Hx     Allergies  Allergen Reactions  . Amoxicillin Rash  . Percocet [Oxycodone-Acetaminophen] Nausea And Vomiting and Rash     REVIEW OF SYSTEMS (Negative unless checked)  Constitutional: [] ?Weight loss  [] ?Fever  [] ?Chills Cardiac: [] ?Chest pain   [] ?Chest pressure   [] ?Palpitations   [] ?Shortness of breath when laying flat   [] ?Shortness of breath at rest   [] ?Shortness of breath with exertion. Vascular:  [x] ?Pain in legs with walking   [x] ?Pain in legs at rest   [] ?Pain in legs when laying flat   [] ?Claudication   [] ?Pain in feet when walking  [] ?Pain in feet at rest  [] ?Pain in feet when laying flat   [] ?History of DVT   [] ?Phlebitis   [x] ?Swelling in legs   [] ?Varicose veins   [] ?Non-healing ulcers Pulmonary:   [] ?Uses home oxygen   [] ?Productive cough   [] ?Hemoptysis   [] ?Wheeze  [] ?COPD   [] ?Asthma Neurologic:  [] ?Dizziness  [] ?Blackouts   [] ?Seizures   [] ?History of stroke   [] ?History of TIA  [] ?Aphasia   [] ?Temporary blindness   [] ?Dysphagia   [] ?Weakness or numbness in arms   [] ?Weakness or numbness in legs Musculoskeletal:  [] ?  Arthritis   [] ?Joint swelling   [] ?Joint pain   [] ?Low back pain Hematologic:  [] ?Easy bruising  [] ?Easy bleeding   [] ?Hypercoagulable state   [] ?Anemic  [] ?Hepatitis Gastrointestinal:  [] ?Blood in stool   [] ?Vomiting blood  [] ?Gastroesophageal reflux/heartburn   [] ?Abdominal pain Genitourinary:  [] ?Chronic kidney disease   [] ?Difficult urination  [] ?Frequent urination  [] ?Burning with urination   [] ?Hematuria Skin:  [] ?Rashes   [] ?Ulcers   [] ?Wounds Psychological:  [x] ?History of anxiety   [x] ? History of major depression.  Physical Examination  BP 124/83   Pulse 82   Ht 5\' 1"  (1.549 m)   Wt 205 lb (93 kg)   BMI 38.73 kg/m  Gen:  WD/WN, NAD Head: [place]/AT, No temporalis wasting. Ear/Nose/Throat: Hearing grossly intact, nares w/o erythema or  drainage Eyes: Conjunctiva clear. Sclera non-icteric Neck: Supple.  Trachea midline Pulmonary:  Good air movement, no use of accessory muscles.  Cardiac: RRR, no JVD Vascular:  Vessel Right Left  Radial Palpable Palpable                          PT Palpable Palpable  DP Palpable Palpable   Gastrointestinal: soft, non-tender/non-distended. No guarding/reflex.  Musculoskeletal: M/S 5/5 throughout.  No deformity or atrophy. 1+ LLE edema distally. Neurologic: Sensation grossly intact in extremities.  Symmetrical.  Speech is fluent.  Psychiatric: Judgment intact, Mood & affect appropriate for pt's clinical situation. Dermatologic: No rashes or ulcers noted.  No cellulitis or open wounds.       Labs Recent Results (from the past 2160 hour(s))  Cytology - PAP     Status: None   Collection Time: 06/21/20  2:23 PM  Result Value Ref Range   High risk HPV Negative    Adequacy      Satisfactory for evaluation; transformation zone component PRESENT.   Diagnosis      - Negative for intraepithelial lesion or malignancy (NILM)   Comment Normal Reference Range HPV - Negative     Radiology No results found.  Assessment/Plan  Class 1 obesity due to excess calories without serious comorbidity with body mass index (BMI) of 30.0 to 30.9 in adult Weight loss of benefit to improve lower extremity swelling  Lymphedema The patient has been going to the lymphedema clinic and has improved with manual massage and therapy.  They are doing a great job.  Despite this, her left leg continues to have some swelling.  Given her young age and the chronic nature of the situation, she is interested in doing everything she can to exhaust her options for improvement.  On that note, I think a venogram of the left lower extremity to evaluate for a may Thurner situation is a reasonable option can be done in the near future at the patient's convenience.  Risks and benefits are discussed.  She is agreeable to  proceed.  Swelling of limb The patient has been going to the lymphedema clinic and has improved with manual massage and therapy.  They are doing a great job.  Despite this, her left leg continues to have some swelling.  Given her young age and the chronic nature of the situation, she is interested in doing everything she can to exhaust her options for improvement.  On that note, I think a venogram of the left lower extremity to evaluate for a may Thurner situation is a reasonable option can be done in the near future at the patient's convenience.  Risks and benefits are discussed.  She is agreeable to proceed.    Festus Barren, MD  09/11/2020 3:45 PM    This note was created with Dragon medical transcription system.  Any errors from dictation are purely unintentional

## 2020-09-11 NOTE — Assessment & Plan Note (Signed)
Weight loss of benefit to improve lower extremity swelling

## 2020-09-11 NOTE — Assessment & Plan Note (Signed)
The patient has been going to the lymphedema clinic and has improved with manual massage and therapy.  They are doing a great job.  Despite this, her left leg continues to have some swelling.  Given her young age and the chronic nature of the situation, she is interested in doing everything she can to exhaust her options for improvement.  On that note, I think a venogram of the left lower extremity to evaluate for a may Thurner situation is a reasonable option can be done in the near future at the patient's convenience.  Risks and benefits are discussed.  She is agreeable to proceed.

## 2020-09-11 NOTE — H&P (View-Only) (Signed)
MRN : [id number]  Mallory Johnston is a 29 y.o. (09/06/1991) female who presents with chief complaint of  Chief Complaint  Patient presents with  . Follow-up    6 mo follow up  .  History of Present Illness: Patient returns today in follow up of her left leg swelling.  Since April, she has been going to the lymphedema clinic and they have done an excellent job with wrapping and manual massage.  Her swelling is down although the left lower extremity swelling particular around the foot and ankle still persists.  This is frustrating.  She really has done a good job of wearing her compression, elevating her legs, and doing all of the therapies that are asked of her.  Current Outpatient Medications  Medication Sig Dispense Refill  . desogestrel-ethinyl estradiol (MIRCETTE) 0.15-0.02/0.01 MG (21/5) tablet Take 1 tablet by mouth at bedtime. 84 tablet 3  . mirtazapine (REMERON) 30 MG tablet Take 1 tablet (30 mg total) by mouth at bedtime. 30 tablet 0  . naproxen (NAPROSYN) 500 MG tablet Take 1 tablet (500 mg total) by mouth 2 (two) times daily. (Patient not taking: Reported on 07/31/2020) 60 tablet 0  . traZODone (DESYREL) 100 MG tablet TAKE 1 TABLET (100 MG TOTAL) BY MOUTH AT BEDTIME AS NEEDED FOR SLEEP. (Patient not taking: Reported on 07/31/2020) 30 tablet 0   No current facility-administered medications for this visit.    Past Medical History:  Diagnosis Date  . H/O bilateral breast reduction surgery   . Lymphedema of left leg   . Ovarian cyst   . S/P tonsillectomy     Past Surgical History:  Procedure Laterality Date  . BREAST REDUCTION SURGERY    . OVARIAN CYST REMOVAL    . TONSILLECTOMY       Social History   Tobacco Use  . Smoking status: Never Smoker  . Smokeless tobacco: Never Used  Vaping Use  . Vaping Use: Never used  Substance Use Topics  . Alcohol use: Yes    Comment: rare  . Drug use: No     Family History  Problem Relation Age of Onset  . Hypertension  Father   . Diabetes Mother   . Breast cancer Neg Hx   . Ovarian cancer Neg Hx   . Colon cancer Neg Hx     Allergies  Allergen Reactions  . Amoxicillin Rash  . Percocet [Oxycodone-Acetaminophen] Nausea And Vomiting and Rash     REVIEW OF SYSTEMS (Negative unless checked)  Constitutional: [] ?Weight loss  [] ?Fever  [] ?Chills Cardiac: [] ?Chest pain   [] ?Chest pressure   [] ?Palpitations   [] ?Shortness of breath when laying flat   [] ?Shortness of breath at rest   [] ?Shortness of breath with exertion. Vascular:  [x] ?Pain in legs with walking   [x] ?Pain in legs at rest   [] ?Pain in legs when laying flat   [] ?Claudication   [] ?Pain in feet when walking  [] ?Pain in feet at rest  [] ?Pain in feet when laying flat   [] ?History of DVT   [] ?Phlebitis   [x] ?Swelling in legs   [] ?Varicose veins   [] ?Non-healing ulcers Pulmonary:   [] ?Uses home oxygen   [] ?Productive cough   [] ?Hemoptysis   [] ?Wheeze  [] ?COPD   [] ?Asthma Neurologic:  [] ?Dizziness  [] ?Blackouts   [] ?Seizures   [] ?History of stroke   [] ?History of TIA  [] ?Aphasia   [] ?Temporary blindness   [] ?Dysphagia   [] ?Weakness or numbness in arms   [] ?Weakness or numbness in legs Musculoskeletal:  [] ?  Arthritis   [] ?Joint swelling   [] ?Joint pain   [] ?Low back pain Hematologic:  [] ?Easy bruising  [] ?Easy bleeding   [] ?Hypercoagulable state   [] ?Anemic  [] ?Hepatitis Gastrointestinal:  [] ?Blood in stool   [] ?Vomiting blood  [] ?Gastroesophageal reflux/heartburn   [] ?Abdominal pain Genitourinary:  [] ?Chronic kidney disease   [] ?Difficult urination  [] ?Frequent urination  [] ?Burning with urination   [] ?Hematuria Skin:  [] ?Rashes   [] ?Ulcers   [] ?Wounds Psychological:  [x] ?History of anxiety   [x] ? History of major depression.  Physical Examination  BP 124/83   Pulse 82   Ht 5\' 1"  (1.549 m)   Wt 205 lb (93 kg)   BMI 38.73 kg/m  Gen:  WD/WN, NAD Head: [place]/AT, No temporalis wasting. Ear/Nose/Throat: Hearing grossly intact, nares w/o erythema or  drainage Eyes: Conjunctiva clear. Sclera non-icteric Neck: Supple.  Trachea midline Pulmonary:  Good air movement, no use of accessory muscles.  Cardiac: RRR, no JVD Vascular:  Vessel Right Left  Radial Palpable Palpable                          PT Palpable Palpable  DP Palpable Palpable   Gastrointestinal: soft, non-tender/non-distended. No guarding/reflex.  Musculoskeletal: M/S 5/5 throughout.  No deformity or atrophy. 1+ LLE edema distally. Neurologic: Sensation grossly intact in extremities.  Symmetrical.  Speech is fluent.  Psychiatric: Judgment intact, Mood & affect appropriate for pt's clinical situation. Dermatologic: No rashes or ulcers noted.  No cellulitis or open wounds.       Labs Recent Results (from the past 2160 hour(s))  Cytology - PAP     Status: None   Collection Time: 06/21/20  2:23 PM  Result Value Ref Range   High risk HPV Negative    Adequacy      Satisfactory for evaluation; transformation zone component PRESENT.   Diagnosis      - Negative for intraepithelial lesion or malignancy (NILM)   Comment Normal Reference Range HPV - Negative     Radiology No results found.  Assessment/Plan  Class 1 obesity due to excess calories without serious comorbidity with body mass index (BMI) of 30.0 to 30.9 in adult Weight loss of benefit to improve lower extremity swelling  Lymphedema The patient has been going to the lymphedema clinic and has improved with manual massage and therapy.  They are doing a great job.  Despite this, her left leg continues to have some swelling.  Given her young age and the chronic nature of the situation, she is interested in doing everything she can to exhaust her options for improvement.  On that note, I think a venogram of the left lower extremity to evaluate for a may Thurner situation is a reasonable option can be done in the near future at the patient's convenience.  Risks and benefits are discussed.  She is agreeable to  proceed.  Swelling of limb The patient has been going to the lymphedema clinic and has improved with manual massage and therapy.  They are doing a great job.  Despite this, her left leg continues to have some swelling.  Given her young age and the chronic nature of the situation, she is interested in doing everything she can to exhaust her options for improvement.  On that note, I think a venogram of the left lower extremity to evaluate for a may Thurner situation is a reasonable option can be done in the near future at the patient's convenience.  Risks and benefits are discussed.  She is agreeable to proceed.    Festus Barren, MD  09/11/2020 3:45 PM    This note was created with Dragon medical transcription system.  Any errors from dictation are purely unintentional

## 2020-09-11 NOTE — Patient Instructions (Signed)
Venogram A venogram, or venography, is a procedure that uses an X-ray and dye (contrast) to examine how well the veins work and how blood flows through them. Contrast helps the veins show up on X-rays. A venogram may be done:  To evaluate abnormalities in the vein.  To identify clots within veins, such as deep vein thrombosis (DVT).  To map out the veins that might be needed for another procedure. Tell a health care provider about:  Any allergies you have, especially to medicines, shellfish, iodine, and contrast.  All medicines you are taking, including vitamins, herbs, eye drops, creams, and over-the-counter medicines.  Any problems you or family members have had with anesthetic medicines.  Any blood disorders you have.  Any surgeries you have had and any complications that occurred.  Any medical conditions you have.  Whether you are pregnant, may be pregnant, or are breastfeeding.  Any history of smoking or tobacco use. What are the risks? Generally, this is a safe procedure. However, problems may occur, including:  Infection.  Bleeding.  Blood clots.  Allergic reaction to medicines or contrast.  Damage to other structures or organs.  Kidney problems.  Increased risk of cancer. Being exposed to too much radiation over a lifetime can increase the risk of cancer. The risk is small. What happens before the procedure? Medicines Ask your health care provider about:  Changing or stopping your regular medicines. This is especially important if you are taking diabetes medicines or blood thinners.  Taking medicines such as aspirin and ibuprofen. These medicines can thin your blood. Do not take these medicines unless your health care provider tells you to take them.  Taking over-the-counter medicines, vitamins, herbs, and supplements. General instructions  Follow instructions from your health care provider about eating or drinking restrictions.  You may have blood tests  to check how well your kidneys and liver are working and how well your blood can clot.  Plan to have someone take you home from the hospital or clinic. What happens during the procedure?   An IV will be inserted into one of your veins.  You may be given a medicine to help you relax (sedative).  You will lie down on an X-ray table. The table may be tilted in different directions during the procedure to help the contrast move throughout your body. Safety straps will keep you secure if the table is tilted.  If veins in your arm or leg will be examined, a band may be wrapped around that arm or leg to keep the veins full of blood. This may cause your arm or leg to feel numb.  The contrast will be injected into your IV. You may have a hot, flushed feeling as it moves throughout your body. You may also have a metallic taste in your mouth. Both of these sensations will go away after the test is complete.  You may be asked to lie in different positions or place your legs or arms in different positions.  At the end of the procedure, you may be given IV fluids to help wash or flush the contrast out of your veins.  The IV will be removed, and pressure will be applied to the IV site to prevent bleeding. A bandage (dressing) may be applied to the IV site. The exact procedure may vary among health care providers and hospitals. What can I expect after the procedure?  Your blood pressure, heart rate, breathing rate, and blood oxygen level will be monitored until you   leave the hospital or clinic.  You may be given something to eat and drink.  You may have bruising or mild discomfort in the area where the IV was inserted. Follow these instructions at home: Eating and drinking   Follow instructions from your health care provider about eating or drinking restrictions.  Drink a lot of water for the first several days after the procedure, as directed by your health care provider. This helps to flush the  contrast out of your body. Activity  Rest as told by your health care provider.  Return to your normal activities as told by your health care provider. Ask your health care provider what activities are safe for you.  If you were given a sedative during your procedure, do not drive for 24 hours or until your health care provider approves. General instructions  Check your IV insertion area every day for signs of infection. Check for: ? Redness, swelling, or pain. ? Fluid or blood. ? Warmth. ? Pus or a bad smell.  Take over-the-counter and prescription medicines only as told by your health care provider.  Keep all follow-up visits as told by your health care provider. This is important. Contact a health care provider if:  Your skin becomes itchy or you develop a rash or hives.  You have a fever that does not get better with medicine.  You feel nauseous or you vomit.  You have redness, swelling, or pain around the insertion site.  You have fluid or blood coming from the insertion site.  Your insertion area feels warm to the touch.  You have pus or a bad smell coming from the insertion site. Get help right away if you:  Have shortness of breath or difficulty breathing.  Develop chest pain.  Faint.  Feel very dizzy. These symptoms may represent a serious problem that is an emergency. Do not wait to see if the symptoms will go away. Get medical help right away. Call your local emergency services (911 in the [place] Do not drive yourself to the hospital. Summary  A venogram, or venography, is a procedure that uses an X-ray and contrast dye to check how well the veins work and how blood flows through them.  An IV will be inserted into one of your veins in order to inject the contrast.  During the exam, you will lie on an X-ray table. The table may be tilted in different directions during the procedure to help the contrast move throughout your body. Safety straps will keep you  secure.  After the procedure, you will need to drink a lot of water to help wash or flush the contrast out of your body. This information is not intended to replace advice given to you by your health care provider. Make sure you discuss any questions you have with your health care provider. Document Revised: 07/23/2019 Document Reviewed: 07/23/2019 Elsevier Patient Education  2020 [employer]  

## 2020-09-12 ENCOUNTER — Encounter (INDEPENDENT_AMBULATORY_CARE_PROVIDER_SITE_OTHER): Payer: Self-pay | Admitting: Nurse Practitioner

## 2020-09-12 ENCOUNTER — Encounter (INDEPENDENT_AMBULATORY_CARE_PROVIDER_SITE_OTHER): Payer: Self-pay

## 2020-09-12 ENCOUNTER — Telehealth (INDEPENDENT_AMBULATORY_CARE_PROVIDER_SITE_OTHER): Payer: Self-pay

## 2020-09-12 NOTE — Telephone Encounter (Signed)
Spoke with the patient and she is scheduled with Dr. Wyn Quaker on 09/24/20 with a 6:45 am arrival time to the [facility]. Covid testing on 09/20/20 between 8-1 pm at the [facility]. Pre-procedure instructions were discussed and will be mailed.

## 2020-09-13 ENCOUNTER — Ambulatory Visit: Payer: BC Managed Care – PPO | Admitting: Occupational Therapy

## 2020-09-13 ENCOUNTER — Other Ambulatory Visit: Payer: Self-pay

## 2020-09-13 ENCOUNTER — Encounter (INDEPENDENT_AMBULATORY_CARE_PROVIDER_SITE_OTHER): Payer: Self-pay | Admitting: Nurse Practitioner

## 2020-09-13 DIAGNOSIS — I890 Lymphedema, not elsewhere classified: Secondary | ICD-10-CM

## 2020-09-13 NOTE — Therapy (Signed)
[facility] MAIN [facility] SERVICES [address] [place], [place], [address] Phone: [phone]   Fax:  [phone]  Occupational Therapy Treatment  Patient Details  Name: Mallory Johnston MRN: [id number] Date of Birth: 08-11-1991 Referring Provider (OT): Sheppard Plumber, NP   Encounter Date: 09/13/2020    Past Medical History:  Diagnosis Date  . H/O bilateral breast reduction surgery   . Lymphedema of left leg   . Ovarian cyst   . S/P tonsillectomy     Past Surgical History:  Procedure Laterality Date  . BREAST REDUCTION SURGERY    . OVARIAN CYST REMOVAL    . TONSILLECTOMY      There were no vitals filed for this visit.   Subjective Assessment - 09/13/20 0814    Subjective  Anjali Arnall presents for OT visit 23/36 to address BLE lymphedema. Pt presents without compression wraps in place. Pt reports she paid for her custom compression garment on line during visit interval. Pt has 6 visits remaining. Pt reports she saw referring vascular provider and she has a venogram pending.    Pertinent History H/O ovarian cyst, tonsilectomy, B breast reduction; works 2 jobs standing and walking,    Limitations chronic leg swelling and associated pain, difficulty walking. impaired standing tolerance, dependent sitting, car transfers,  impaired body image,    Special Tests +Stemmer L>R                        OT Treatments/Exercises (OP) - 09/13/20 0001      ADLs   ADL Education Given Yes      Manual Therapy   Manual Therapy Edema management;Manual Lymphatic Drainage (MLD);Compression Bandaging    Manual Lymphatic Drainage (MLD) LLE MLD utilizing shrt neck sequence, deep abdominal pathway , functionall inguinal LN and UJ strokes to LLE from proximal to distal.    Compression Bandaging modified bandage today in effort to increase ankle flexibility and reduce pain. Used single 10 cm wide ace wrap over single layer of Rosidal foam. Still  awaiting custom compression garment delivery    Other Manual Therapy skin care with low ph castor oil for optimal skin stretch during MLD and to restore acidic matrix to skin                       OT Long Term Goals - 07/26/20 0900      OT LONG TERM GOAL #1   Title Pt will be able to verbalize signs and symptoms of cellulitis infection , and be able to name 6 common lymphedema precautions using printed resource for reference (modified independence)  to limit LE progression over time .    Baseline Max A    Time 5    Period Days    Status Achieved      OT LONG TERM GOAL #2   Title Pt will retain volumetric reduction achieved during Intensive Phase CDT for 6 months after transitioning to Self Management Phase as evidenced by no greater than 3% limb volume increase when measured for follow up.    Baseline Min A    Time 6    Period Months    Status On-going      OT LONG TERM GOAL #3   Title Pt will be able to apply BLE, knee length, multi-layer, short stretch compression wraps using correct gradient techniques with modified independence (extra time) to achieve optimal limb volume reduction, and to return affected  limb , as closely as possible, to premorbid size and shape.    Baseline Max A    Time 4    Period Days    Status Achieved      OT LONG TERM GOAL #4   Title Pt will achieve at least a 10% BLE limb volume reductions below knees ( ankle- tibial tuberosity A-D) during Intensive Phase CDT to improve AROM at LE joints for functional activities, ambulation and transfers, to reduce infection risk, and to limit LE progression.    Baseline Max A    Time 12    Period Weeks    Status On-going   achieved 8.29% decrease in LLE volume below the knee to date     OT LONG TERM GOAL #5   Title Pt will achieve and sustain 85% compliance with daily LE self-care home program components throughout Intensive Phase CDT, including impeccable skin care, lymphatic pumping therex, daily  sequential pneumatic pump (one leg per day on alternating legs) and custom compression to ensure optimal limb volume reduction, to limit infection risk and to limit LE progression.    Baseline Max A    Time 12    Period Weeks    Status Achieved      OT LONG TERM GOAL #6   Title Pt will demonstrate modified independence using assistive devices and extra time  during LE self-care training to don and doff properly fitting, custom compression garments/devices with Max A for optimal LE self-management over time.    Baseline Max A    Time 12    Period Days    Status On-going                 Plan - 09/13/20 1303    Clinical Impression Statement Provided LLE/LLQ MLD, skin care and compression wraps as established without increased  pain. Awaiting delivery of custom cmpression garments/ devices. Pt tells me these have been ordered, Cont as per POC.    Occupational performance deficits (Please refer to evaluation for details): ADL's;IADL's;Work;Leisure;Social Participation;Other    Body Structure / Function / Physical Skills ADL;Decreased knowledge of precautions;Mobility;Decreased knowledge of use of DME;Edema;Skin integrity;Pain;IADL    Rehab Potential Good    Clinical Decision Making Several treatment options, min-mod task modification necessary    Comorbidities Affecting Occupational Performance: May have comorbidities impacting occupational performance    Modification or Assistance to Complete Evaluation  No modification of tasks or assist necessary to complete eval    OT Treatment/Interventions Self-care/ADL training;Therapeutic exercise;Manual lymph drainage;Patient/family education;Compression bandaging;Building services engineer;Therapeutic activities;Manual Therapy;DME and/or AE instruction    Plan Complete Decomgestive Therapy, Intensive and Self-Manageent Phase support: Manual lymphatic drainage (MLD) skin care, ther ex, compression wraps and garments. Consider    Recommended  Other Services Fit Pt with BLE, custom, flat knit Jobst Elvarex compression knee highs. Consider Elvarex SOFT, ccl 3 (34-46 mmHg). Consider Jobst RELAX HOS device to limit fibrosis formation. Fit with Flexitouch advanced sequential pneumatic compression device ("PUMP") to aid in daily LE self care at home over time           Patient will benefit from skilled therapeutic intervention in order to improve the following deficits and impairments:   Body Structure / Function / Physical Skills: ADL, Decreased knowledge of precautions, Mobility, Decreased knowledge of use of DME, Edema, Skin integrity, Pain, IADL       Visit Diagnosis: Lymphedema, not elsewhere classified    Problem List Patient Active Problem List   Diagnosis Date Noted  .  Swelling of limb 11/29/2019  . Lymphedema 11/29/2019  . Severe recurrent major depression without psychotic features (HCC) 03/07/2019  . Suicidal ideation 03/06/2019  . Noncompliance 03/06/2019  . Class 1 obesity due to excess calories without serious comorbidity with body mass index (BMI) of 30.0 to 30.9 in adult 02/11/2017  . History of cervical dysplasia 02/11/2017  . Liver lesion, right lobe 10/17/2016  . Von Willebrand's disease (HCC) 11/10/2015  . PCOS (polycystic ovarian syndrome) 09/20/2015    Loel Dubonnet, MS, OTR/L, [facility] 09/13/20 1:05 PM  [facility] MAIN [facility] SERVICES [address] [place], [place], [address] Phone: [phone]   Fax:  [phone]  Name: MERISSA RENWICK MRN: [id number] Date of Birth: 1991/10/26

## 2020-09-20 ENCOUNTER — Other Ambulatory Visit: Payer: Self-pay

## 2020-09-20 ENCOUNTER — Other Ambulatory Visit
Admission: RE | Admit: 2020-09-20 | Discharge: 2020-09-20 | Disposition: A | Discharge status: Home / Self Care | Payer: BC Managed Care – PPO | Source: Ambulatory Visit | Attending: Vascular Surgery | Admitting: Vascular Surgery

## 2020-09-20 DIAGNOSIS — Z20822 Contact with and (suspected) exposure to covid-19: Secondary | ICD-10-CM | POA: Diagnosis not present

## 2020-09-20 DIAGNOSIS — Z01812 Encounter for preprocedural laboratory examination: Secondary | ICD-10-CM | POA: Diagnosis not present

## 2020-09-20 LAB — SARS CORONAVIRUS 2 (TAT 6-24 HRS): SARS Coronavirus 2: NEGATIVE

## 2020-09-24 ENCOUNTER — Ambulatory Visit
Admission: RE | Admit: 2020-09-24 | Discharge: 2020-09-24 | Disposition: A | Discharge status: Home / Self Care | Payer: BC Managed Care – PPO | Attending: Vascular Surgery | Admitting: Vascular Surgery

## 2020-09-24 ENCOUNTER — Encounter: Payer: Self-pay | Admitting: Vascular Surgery

## 2020-09-24 ENCOUNTER — Other Ambulatory Visit: Payer: Self-pay

## 2020-09-24 ENCOUNTER — Other Ambulatory Visit (INDEPENDENT_AMBULATORY_CARE_PROVIDER_SITE_OTHER): Payer: Self-pay | Admitting: Nurse Practitioner

## 2020-09-24 ENCOUNTER — Encounter
Admission: RE | Disposition: A | Discharge status: Home / Self Care | Payer: Self-pay | Source: Home / Self Care | Attending: Vascular Surgery

## 2020-09-24 DIAGNOSIS — Z6838 Body mass index (BMI) 38.0-38.9, adult: Secondary | ICD-10-CM | POA: Insufficient documentation

## 2020-09-24 DIAGNOSIS — E6609 Other obesity due to excess calories: Secondary | ICD-10-CM | POA: Insufficient documentation

## 2020-09-24 DIAGNOSIS — I890 Lymphedema, not elsewhere classified: Secondary | ICD-10-CM | POA: Diagnosis present

## 2020-09-24 DIAGNOSIS — R2242 Localized swelling, mass and lump, left lower limb: Secondary | ICD-10-CM | POA: Diagnosis not present

## 2020-09-24 DIAGNOSIS — Z79899 Other long term (current) drug therapy: Secondary | ICD-10-CM | POA: Diagnosis not present

## 2020-09-24 HISTORY — PX: LOWER EXTREMITY VENOGRAPHY: CATH118253

## 2020-09-24 LAB — BUN: BUN: 9 mg/dL (ref 6–20)

## 2020-09-24 LAB — CREATININE, SERUM
Creatinine, Ser: 0.74 mg/dL (ref 0.44–1.00)
GFR calc Af Amer: >60 mL/min (ref >60)
GFR calc non Af Amer: >60 mL/min (ref >60)

## 2020-09-24 LAB — PREGNANCY, URINE: Preg Test, Ur: NEGATIVE

## 2020-09-24 SURGERY — LOWER EXTREMITY VENOGRAPHY
Anesthesia: Moderate Sedation | Site: Leg Lower | Laterality: Left

## 2020-09-24 MED ORDER — IODIXANOL 320 MG/ML IV SOLN
INTRAVENOUS | Status: DC | PRN
  Administered 2020-09-24: 15 mL

## 2020-09-24 MED ORDER — FENTANYL CITRATE (PF) 100 MCG/2ML IJ SOLN
INTRAMUSCULAR | Status: DC | PRN
  Administered 2020-09-24: 25 ug via INTRAVENOUS
  Administered 2020-09-24: 50 ug via INTRAVENOUS

## 2020-09-24 MED ORDER — FENTANYL CITRATE (PF) 100 MCG/2ML IJ SOLN
INTRAMUSCULAR | Status: AC
  Filled 2020-09-24: qty 2

## 2020-09-24 MED ORDER — MIDAZOLAM HCL 5 MG/5ML IJ SOLN
INTRAMUSCULAR | Status: AC
  Filled 2020-09-24: qty 5

## 2020-09-24 MED ORDER — ONDANSETRON HCL 4 MG/2ML IJ SOLN: 4.0000 mg | Freq: Four times a day (QID) | INTRAMUSCULAR | Status: DC | PRN

## 2020-09-24 MED ORDER — MIDAZOLAM HCL 2 MG/2ML IJ SOLN
INTRAMUSCULAR | Status: DC | PRN
  Administered 2020-09-24: 1 mg via INTRAVENOUS
  Administered 2020-09-24: 2 mg via INTRAVENOUS

## 2020-09-24 MED ORDER — MIDAZOLAM HCL 2 MG/ML PO SYRP: 8.0000 mg | ORAL_SOLUTION | Freq: Once | ORAL | Status: DC | PRN

## 2020-09-24 MED ORDER — FENTANYL CITRATE (PF) 100 MCG/2ML IJ SOLN: 12.5000 ug | Freq: Once | INTRAMUSCULAR | Status: DC | PRN

## 2020-09-24 MED ORDER — FAMOTIDINE 20 MG PO TABS: 40.0000 mg | ORAL_TABLET | Freq: Once | ORAL | Status: DC | PRN

## 2020-09-24 MED ORDER — CLINDAMYCIN PHOSPHATE 300 MG/50ML IV SOLN: 300.0000 mg | Freq: Once | INTRAVENOUS | Status: AC

## 2020-09-24 MED ORDER — METHYLPREDNISOLONE SODIUM SUCC 125 MG IJ SOLR: 125.0000 mg | Freq: Once | INTRAMUSCULAR | Status: DC | PRN

## 2020-09-24 MED ORDER — SODIUM CHLORIDE 0.9 % IV SOLN: INTRAVENOUS | Status: DC

## 2020-09-24 MED ORDER — CLINDAMYCIN PHOSPHATE 300 MG/50ML IV SOLN
INTRAVENOUS | Status: AC
  Administered 2020-09-24: 300 mg via INTRAVENOUS
  Filled 2020-09-24: qty 50

## 2020-09-24 MED ORDER — DIPHENHYDRAMINE HCL 50 MG/ML IJ SOLN: 50.0000 mg | Freq: Once | INTRAMUSCULAR | Status: DC | PRN

## 2020-09-24 SURGICAL SUPPLY — 3 items
CANNULA 5F STIFF (CANNULA) ×3 IMPLANT
PACK ANGIOGRAPHY (CUSTOM PROCEDURE TRAY) ×3 IMPLANT
SHEATH BRITE TIP 5FRX11 (SHEATH) ×3 IMPLANT

## 2020-09-24 NOTE — Discharge Instructions (Signed)
Moderate Conscious Sedation, Adult, Care After These instructions provide you with information about caring for yourself after your procedure. Your health care provider may also give you more specific instructions. Your treatment has been planned according to current medical practices, but problems sometimes occur. Call your health care provider if you have any problems or questions after your procedure. What can I expect after the procedure? After your procedure, it is common:  To feel sleepy for several hours.  To feel clumsy and have poor balance for several hours.  To have poor judgment for several hours.  To vomit if you eat too soon. Follow these instructions at home: For at least 24 hours after the procedure:   Do not: ? Participate in activities where you could fall or become injured. ? Drive. ? Use heavy machinery. ? Drink alcohol. ? Take sleeping pills or medicines that cause drowsiness. ? Make important decisions or sign legal documents. ? Take care of children on your own.  Rest. Eating and drinking  Follow the diet recommended by your health care provider.  If you vomit: ? Drink water, juice, or soup when you can drink without vomiting. ? Make sure you have little or no nausea before eating solid foods. General instructions  Have a responsible adult stay with you until you are awake and alert.  Take over-the-counter and prescription medicines only as told by your health care provider.  If you smoke, do not smoke without supervision.  Keep all follow-up visits as told by your health care provider. This is important. Contact a health care provider if:  You keep feeling nauseous or you keep vomiting.  You feel light-headed.  You develop a rash.  You have a fever. Get help right away if:  You have trouble breathing. This information is not intended to replace advice given to you by your health care provider. Make sure you discuss any questions you have  with your health care provider. Document Revised: 11/27/2017 Document Reviewed: 04/05/2016 Elsevier Patient Education  2020 Elsevier [employer] Venogram A venogram, or venography, is a procedure that uses an X-ray and dye (contrast) to examine how well the veins work and how blood flows through them. Contrast helps the veins show up on X-rays. A venogram may be done:  To evaluate abnormalities in the vein.  To identify clots within veins, such as deep vein thrombosis (DVT).  To map out the veins that might be needed for another procedure. Tell a health care provider about:  Any allergies you have, especially to medicines, shellfish, iodine, and contrast.  All medicines you are taking, including vitamins, herbs, eye drops, creams, and over-the-counter medicines.  Any problems you or family members have had with anesthetic medicines.  Any blood disorders you have.  Any surgeries you have had and any complications that occurred.  Any medical conditions you have.  Whether you are pregnant, may be pregnant, or are breastfeeding.  Any history of smoking or tobacco use. What are the risks? Generally, this is a safe procedure. However, problems may occur, including:  Infection.  Bleeding.  Blood clots.  Allergic reaction to medicines or contrast.  Damage to other structures or organs.  Kidney problems.  Increased risk of cancer. Being exposed to too much radiation over a lifetime can increase the risk of cancer. The risk is small. What happens before the procedure? Medicines Ask your health care provider about:  Changing or stopping your regular medicines. This is especially important if you are taking diabetes medicines   or blood thinners.  Taking medicines such as aspirin and ibuprofen. These medicines can thin your blood. Do not take these medicines unless your health care provider tells you to take them.  Taking over-the-counter medicines, vitamins, herbs, and  supplements. General instructions  Follow instructions from your health care provider about eating or drinking restrictions.  You may have blood tests to check how well your kidneys and liver are working and how well your blood can clot.  Plan to have someone take you home from the hospital or clinic. What happens during the procedure?   An IV will be inserted into one of your veins.  You may be given a medicine to help you relax (sedative).  You will lie down on an X-ray table. The table may be tilted in different directions during the procedure to help the contrast move throughout your body. Safety straps will keep you secure if the table is tilted.  If veins in your arm or leg will be examined, a band may be wrapped around that arm or leg to keep the veins full of blood. This may cause your arm or leg to feel numb.  The contrast will be injected into your IV. You may have a hot, flushed feeling as it moves throughout your body. You may also have a metallic taste in your mouth. Both of these sensations will go away after the test is complete.  You may be asked to lie in different positions or place your legs or arms in different positions.  At the end of the procedure, you may be given IV fluids to help wash or flush the contrast out of your veins.  The IV will be removed, and pressure will be applied to the IV site to prevent bleeding. A bandage (dressing) may be applied to the IV site. The exact procedure may vary among health care providers and hospitals. What can I expect after the procedure?  Your blood pressure, heart rate, breathing rate, and blood oxygen level will be monitored until you leave the hospital or clinic.  You may be given something to eat and drink.  You may have bruising or mild discomfort in the area where the IV was inserted. Follow these instructions at home: Eating and drinking   Follow instructions from your health care provider about eating or  drinking restrictions.  Drink a lot of water for the first several days after the procedure, as directed by your health care provider. This helps to flush the contrast out of your body. Activity  Rest as told by your health care provider.  Return to your normal activities as told by your health care provider. Ask your health care provider what activities are safe for you.  If you were given a sedative during your procedure, do not drive for 24 hours or until your health care provider approves. General instructions  Check your IV insertion area every day for signs of infection. Check for: ? Redness, swelling, or pain. ? Fluid or blood. ? Warmth. ? Pus or a bad smell.  Take over-the-counter and prescription medicines only as told by your health care provider.  Keep all follow-up visits as told by your health care provider. This is important. Contact a health care provider if:  Your skin becomes itchy or you develop a rash or hives.  You have a fever that does not get better with medicine.  You feel nauseous or you vomit.  You have redness, swelling, or pain around the insertion site.    You have fluid or blood coming from the insertion site.  Your insertion area feels warm to the touch.  You have pus or a bad smell coming from the insertion site. Get help right away if you:  Have shortness of breath or difficulty breathing.  Develop chest pain.  Faint.  Feel very dizzy. These symptoms may represent a serious problem that is an emergency. Do not wait to see if the symptoms will go away. Get medical help right away. Call your local emergency services (911 in the [place] Do not drive yourself to the hospital. Summary  A venogram, or venography, is a procedure that uses an X-ray and contrast dye to check how well the veins work and how blood flows through them.  An IV will be inserted into one of your veins in order to inject the contrast.  During the exam, you will lie on  an X-ray table. The table may be tilted in different directions during the procedure to help the contrast move throughout your body. Safety straps will keep you secure.  After the procedure, you will need to drink a lot of water to help wash or flush the contrast out of your body. This information is not intended to replace advice given to you by your health care provider. Make sure you discuss any questions you have with your health care provider. Document Revised: 07/23/2019 Document Reviewed: 07/23/2019 Elsevier Patient Education  2020 Elsevier Inc.  

## 2020-09-24 NOTE — Interval H&P Note (Signed)
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  The various methods of treatment have been discussed with the patient and family. After consideration of risks, benefits and other options for treatment, the patient has consented to  Procedure(s): LOWER EXTREMITY VENOGRAPHY (Left) as a surgical intervention.  The patient's history has been reviewed, patient examined, no change in status, stable for surgery.  I have reviewed the patient's chart and labs.  Questions were answered to the patient's satisfaction.     Festus Barren

## 2020-09-24 NOTE — Op Note (Signed)
[facility]   OPERATIVE NOTE   PRE-OPERATIVE DIAGNOSIS: Significant left leg swelling despite appropriate measures, concern for may Thurner syndrome  POST-OPERATIVE DIAGNOSIS: same   PROCEDURE: 1.   US guidance for vascular access to left popliteal vein 2.   IVC gram and left lower extremity venogram     SURGEON: Festus Barren, MD  ASSISTANT(S): none  ANESTHESIA: local with moderate conscious sedation for 13 minutes using 3 mg of Versed and 75 mcg of Fentanyl  ESTIMATED BLOOD LOSS: 3 cc  FINDING(S): 1.   Left popliteal vein, superficial femoral vein, common femoral vein, external and common iliac veins were all widely patent.  The IVC was also patent without significant stenosis.  SPECIMEN(S):  none  INDICATIONS:    Patient is a 29 y.o. female who presents with chronic left leg pain and swelling.  Patient has marked leg swelling and pain.  Venous evaluation and possible intervention is performed to reduce the symtpoms and evaluate for May Thurner syndrome.    DESCRIPTION: After obtaining full informed written consent, the patient was brought back to the vascular suite and placed supine upon the table. Moderate conscious sedation was administered during a face to face encounter with the patient throughout the procedure with my supervision of the RN administering medicines and monitoring the patient's vital signs, pulse oximetry, telemetry and mental status throughout from the start of the procedure until the patient was taken to the recovery room.  After obtaining adequate anesthesia, the patient was prepped and draped in the standard fashion.    The patient was then placed into the prone position.  The left popliteal vein was then accessed under direct ultrasound guidance without difficulty with a micropuncture needle and a permanent image was recorded.   I then performed imaging through the micropuncture sheath to evaluate the left lower extremity in the left  iliac veins and inferior vena cava.  Hand injections were performed through the micropuncture sheath.  This demonstrated that the left popliteal vein, superficial femoral vein, common femoral vein, external and common iliac veins were all widely patent.  The IVC was also patent without significant stenosis.  I then elected to terminate the procedure.  The sheath was removed and a dressing was placed.  She was taken to the recovery room in stable condition having tolerated the procedure well.    COMPLICATIONS: None  CONDITION: Stable  Festus Barren 09/24/2020 8:48 AM

## 2020-09-24 NOTE — Progress Notes (Signed)
Pt states only to call her mom when she is ready to go. Pt asking to speak with Dr Wyn Quaker. MD aware. Pt is still feeling effects from sedation. MD will wait and speak to her when she is more alert to assure all questions answered.

## 2020-09-27 ENCOUNTER — Other Ambulatory Visit: Payer: Self-pay

## 2020-09-27 ENCOUNTER — Ambulatory Visit: Payer: BC Managed Care – PPO | Admitting: Occupational Therapy

## 2020-09-27 DIAGNOSIS — I890 Lymphedema, not elsewhere classified: Secondary | ICD-10-CM | POA: Diagnosis not present

## 2020-09-27 NOTE — Therapy (Signed)
[facility] MAIN [facility] SERVICES [address] [place], [place], [address] Phone: [phone]   Fax:  [phone]  Occupational Therapy Treatment  Patient Details  Name: Mallory Johnston MRN: [id number] Date of Birth: 07-29-1991 Referring Provider (OT): Sheppard Plumber, NP   Encounter Date: 09/27/2020   OT End of Session - 09/27/20 0857    Visit Number 25    Number of Visits 36    Date for OT Re-Evaluation 10/07/20    OT Start Time 0811    OT Stop Time 0855    OT Time Calculation (min) 44 min    Activity Tolerance Patient tolerated treatment well;No increased pain    Behavior During Therapy WFL for tasks assessed/performed           Past Medical History:  Diagnosis Date  . H/O bilateral breast reduction surgery   . Lymphadenopathy 2009   left leg  . Lymphedema of left leg   . Ovarian cyst   . S/P tonsillectomy     Past Surgical History:  Procedure Laterality Date  . BREAST REDUCTION SURGERY    . LOWER EXTREMITY VENOGRAPHY Left 09/24/2020   Procedure: LOWER EXTREMITY VENOGRAPHY;  Surgeon: Annice Needy, MD;  Location: [facility] INVASIVE CV LAB;  Service: Cardiovascular;  Laterality: Left;  . OVARIAN CYST REMOVAL    . TONSILLECTOMY      There were no vitals filed for this visit.   Subjective Assessment - 09/27/20 0816    Subjective  Mallory Johnston presents for OT visit 24/36 to address BLE lymphedema. Pt presents with new LLE compression knee high and toe cap in place on LLE. "I had a venogram  and they didn't find anything. I got the stockings on Monday. I slept in it last night and it bothered me."                        OT Treatments/Exercises (OP) - 09/27/20 0001      ADLs   ADL Education Given Yes      Manual Therapy   Manual Therapy Edema management;Other (comment)    Edema Management fitting and assessment for  custom Elvarex stockings and Elvarex Plus toe caps. Excellent fit.                  OT  Education - 09/27/20 0856    Education Details Pt edu for custom compression garment wear and careregimes    Person(s) Educated Patient    Methods Explanation;Demonstration;Handout    Comprehension Verbalized understanding;Returned demonstration               OT Long Term Goals - 09/27/20 0900      OT LONG TERM GOAL #1   Title Pt will be able to verbalize signs and symptoms of cellulitis infection , and be able to name 6 common lymphedema precautions using printed resource for reference (modified independence)  to limit LE progression over time .    Baseline Max A    Time 5    Period Days    Status Achieved      OT LONG TERM GOAL #2   Title Pt will retain volumetric reduction achieved during Intensive Phase CDT for 6 months after transitioning to Self Management Phase as evidenced by no greater than 3% limb volume increase when measured for follow up.    Baseline Min A    Time 6    Period Months    Status On-going  OT LONG TERM GOAL #3   Title Pt will be able to apply BLE, knee length, multi-layer, short stretch compression wraps using correct gradient techniques with modified independence (extra time) to achieve optimal limb volume reduction, and to return affected limb , as closely as possible, to premorbid size and shape.    Baseline Max A    Time 4    Period Days    Status Achieved      OT LONG TERM GOAL #4   Title Pt will achieve at least a 10% BLE limb volume reductions below knees ( ankle- tibial tuberosity A-D) during Intensive Phase CDT to improve AROM at LE joints for functional activities, ambulation and transfers, to reduce infection risk, and to limit LE progression.    Baseline Max A    Time 12    Period Weeks    Status On-going   achieved 8.29% decrease in LLE volume below the knee to date     OT LONG TERM GOAL #5   Title Pt will achieve and sustain 85% compliance with daily LE self-care home program components throughout Intensive Phase CDT, including  impeccable skin care, lymphatic pumping therex, daily sequential pneumatic pump (one leg per day on alternating legs) and custom compression to ensure optimal limb volume reduction, to limit infection risk and to limit LE progression.    Baseline Max A    Time 12    Period Weeks    Status Achieved      OT LONG TERM GOAL #6   Title Pt will demonstrate modified independence using assistive devices and extra time  during LE self-care training to don and doff properly fitting, custom compression garments/devices with Max A for optimal LE self-management over time.    Baseline Max A    Time 12    Period Days    Status Achieved                 Plan - 09/27/20 0858    Clinical Impression Statement Upon assessment ustom Elvarex  knee high compression stockings and toe caps appear to fit and function properly. After skilled teaching Pt able to don stockings with extra time using assistive devices (Modified Independence using friction gloves and tyvek sock). Pt verbalized understanding of proper garment wear and care regimes, as well as replacement recommendations. Vendor emailed requesting benefits check in effort to continue to explore Flexitouch device  needed for optimal long term LE self-care. Pt returns next week for follow along. Cont as per POC.    Occupational performance deficits (Please refer to evaluation for details): ADL's;IADL's;Work;Leisure;Social Participation;Other    Body Structure / Function / Physical Skills ADL;Decreased knowledge of precautions;Mobility;Decreased knowledge of use of DME;Edema;Skin integrity;Pain;IADL    Rehab Potential Good    Clinical Decision Making Several treatment options, min-mod task modification necessary    Comorbidities Affecting Occupational Performance: May have comorbidities impacting occupational performance    Modification or Assistance to Complete Evaluation  No modification of tasks or assist necessary to complete eval    OT  Treatment/Interventions Self-care/ADL training;Therapeutic exercise;Manual lymph drainage;Patient/family education;Compression bandaging;Building services engineer;Therapeutic activities;Manual Therapy;DME and/or AE instruction    Plan Complete Decomgestive Therapy, Intensive and Self-Manageent Phase support: Manual lymphatic drainage (MLD) skin care, ther ex, compression wraps and garments. Consider    Recommended Other Services LLE custom, flat knit [employer] Elvarex classic compression knee highs, ccl 3, and  Elvarex PLUS, ccl 2 toe caps. Trial with Flexitouch sequential pneumatic compression device pending.  Patient will benefit from skilled therapeutic intervention in order to improve the following deficits and impairments:   Body Structure / Function / Physical Skills: ADL, Decreased knowledge of precautions, Mobility, Decreased knowledge of use of DME, Edema, Skin integrity, Pain, IADL       Visit Diagnosis: Lymphedema, not elsewhere classified    Problem List Patient Active Problem List   Diagnosis Date Noted  . Swelling of limb 11/29/2019  . Lymphedema 11/29/2019  . Severe recurrent major depression without psychotic features (HCC) 03/07/2019  . Suicidal ideation 03/06/2019  . Noncompliance 03/06/2019  . Class 1 obesity due to excess calories without serious comorbidity with body mass index (BMI) of 30.0 to 30.9 in adult 02/11/2017  . History of cervical dysplasia 02/11/2017  . Liver lesion, right lobe 10/17/2016  . Von Willebrand's disease (HCC) 11/10/2015  . PCOS (polycystic ovarian syndrome) 09/20/2015    Loel Dubonnet, MS, OTR/L, [facility] 09/27/20 9:05 AM  [facility] MAIN [facility] SERVICES [address] [place], [place], [address] Phone: [phone]   Fax:  [phone]  Name: Mallory Johnston MRN: [id number] Date of Birth: 1991-01-01

## 2020-09-30 ENCOUNTER — Encounter (INDEPENDENT_AMBULATORY_CARE_PROVIDER_SITE_OTHER): Payer: Self-pay

## 2020-10-01 ENCOUNTER — Encounter (INDEPENDENT_AMBULATORY_CARE_PROVIDER_SITE_OTHER): Payer: Self-pay | Admitting: Nurse Practitioner

## 2020-10-04 ENCOUNTER — Ambulatory Visit: Payer: BC Managed Care – PPO | Attending: Nurse Practitioner | Admitting: Occupational Therapy

## 2020-10-04 ENCOUNTER — Other Ambulatory Visit: Payer: Self-pay

## 2020-10-04 DIAGNOSIS — M79672 Pain in left foot: Secondary | ICD-10-CM | POA: Insufficient documentation

## 2020-10-04 DIAGNOSIS — I890 Lymphedema, not elsewhere classified: Secondary | ICD-10-CM | POA: Insufficient documentation

## 2020-10-04 NOTE — Therapy (Signed)
[place] [facility] MAIN [facility] SERVICES [address] [place], [place], [address] Phone: [phone]   Fax:  [phone]  Occupational Therapy Treatment  Patient Details  Name: Mallory Johnston MRN: [id number] Date of Birth: Dec 11, 1991 Referring Provider (OT): Sheppard Plumber, NP   Encounter Date: 10/04/2020   OT End of Session - 10/04/20 0953    Visit Number 26    Number of Visits 36    Date for OT Re-Evaluation 10/07/20    OT Start Time 0805    OT Stop Time 0905    OT Time Calculation (min) 60 min    Activity Tolerance Patient tolerated treatment well;No increased pain    Behavior During Therapy WFL for tasks assessed/performed           Past Medical History:  Diagnosis Date  . H/O bilateral breast reduction surgery   . Lymphadenopathy 2009   left leg  . Lymphedema of left leg   . Ovarian cyst   . S/P tonsillectomy     Past Surgical History:  Procedure Laterality Date  . BREAST REDUCTION SURGERY    . LOWER EXTREMITY VENOGRAPHY Left 09/24/2020   Procedure: LOWER EXTREMITY VENOGRAPHY;  Surgeon: Annice Needy, MD;  Location: [facility] INVASIVE CV LAB;  Service: Cardiovascular;  Laterality: Left;  . OVARIAN CYST REMOVAL    . TONSILLECTOMY      There were no vitals filed for this visit.   Subjective Assessment - 10/04/20 0811    Subjective  [name] presents for OT visit 25/36 to address BLE lymphedema. Pt presents in brighter mood this morning. She tlls me that she has started seeing a therapist virtually and it is helping with depression.. Pt reports 7/10 L foot pain. She is agreeable to referral for PT screening for L ankle ROM and tissue mobilization as she transitions from Intensive to Self-Management Phase CDT. Pt tells me Flexitouch has not yet been approved. We texted Rep fr update during session.                        OT Treatments/Exercises (OP) - 10/04/20 0001      ADLs   ADL Education Given Yes (P)       Manual  Therapy   Manual Therapy Edema management;Manual Lymphatic Drainage (MLD);Compression Bandaging (P)     Manual Lymphatic Drainage (MLD) LLE MLD utilizing shrt neck sequence, deep abdominal pathway , functionall inguinal LN and UJ strokes to LLE from proximal to distal. (P)     Other Manual Therapy skin care with low ph castor oil for optimal skin stretch during MLD and to restore acidic matrix to skin (P)                   OT Education - 10/04/20 0949    Education Details Continued skilled Pt/caregiver education  And LE ADL training throughout visit for lymphedema self care/ home program, including compression wrapping, compression garment and device wear/care, lymphatic pumping ther ex, simple self-MLD, and skin care. Discussed progress towards goals.    Person(s) Educated Patient    Methods Explanation;Demonstration;Handout    Comprehension Verbalized understanding;Returned demonstration;Need further instruction               OT Long Term Goals - 09/27/20 0900      OT LONG TERM GOAL #1   Title Pt will be able to verbalize signs and symptoms of cellulitis infection , and be able to name 6 common  lymphedema precautions using printed resource for reference (modified independence)  to limit LE progression over time .    Baseline Max A    Time 5    Period Days    Status Achieved      OT LONG TERM GOAL #2   Title Pt will retain volumetric reduction achieved during Intensive Phase CDT for 6 months after transitioning to Self Management Phase as evidenced by no greater than 3% limb volume increase when measured for follow up.    Baseline Min A    Time 6    Period Months    Status On-going      OT LONG TERM GOAL #3   Title Pt will be able to apply BLE, knee length, multi-layer, short stretch compression wraps using correct gradient techniques with modified independence (extra time) to achieve optimal limb volume reduction, and to return affected limb , as closely as possible, to  premorbid size and shape.    Baseline Max A    Time 4    Period Days    Status Achieved      OT LONG TERM GOAL #4   Title Pt will achieve at least a 10% BLE limb volume reductions below knees ( ankle- tibial tuberosity A-D) during Intensive Phase CDT to improve AROM at LE joints for functional activities, ambulation and transfers, to reduce infection risk, and to limit LE progression.    Baseline Max A    Time 12    Period Weeks    Status On-going   achieved 8.29% decrease in LLE volume below the knee to date     OT LONG TERM GOAL #5   Title Pt will achieve and sustain 85% compliance with daily LE self-care home program components throughout Intensive Phase CDT, including impeccable skin care, lymphatic pumping therex, daily sequential pneumatic pump (one leg per day on alternating legs) and custom compression to ensure optimal limb volume reduction, to limit infection risk and to limit LE progression.    Baseline Max A    Time 12    Period Weeks    Status Achieved      OT LONG TERM GOAL #6   Title Pt will demonstrate modified independence using assistive devices and extra time  during LE self-care training to don and doff properly fitting, custom compression garments/devices with Max A for optimal LE self-management over time.    Baseline Max A    Time 12    Period Days    Status Achieved                 Plan - 10/04/20 0954    Clinical Impression Statement Pt tolerated LLE  MLD, skin, joint mobilization and joint distraction without pain. Sewlling on dorsal foot slightly reduced at base of toes bu end of session. Still awaiting Flexitouch. Rep checking status. Pt reports diligent compression garment compliance to date. Cont as per POC.    Occupational performance deficits (Please refer to evaluation for details): ADL's;IADL's;Work;Leisure;Social Participation;Other    Body Structure / Function / Physical Skills ADL;Decreased knowledge of precautions;Mobility;Decreased  knowledge of use of DME;Edema;Skin integrity;Pain;IADL    Rehab Potential Good    Clinical Decision Making Several treatment options, min-mod task modification necessary    Comorbidities Affecting Occupational Performance: May have comorbidities impacting occupational performance    Modification or Assistance to Complete Evaluation  No modification of tasks or assist necessary to complete eval    OT Treatment/Interventions Self-care/ADL training;Therapeutic exercise;Manual lymph drainage;Patient/family education;Compression bandaging;Functional  Mobility Training;Therapeutic activities;Manual Therapy;DME and/or AE instruction    Plan Complete Decomgestive Therapy, Intensive and Self-Manageent Phase support: Manual lymphatic drainage (MLD) skin care, ther ex, compression wraps and garments. Consider    Recommended Other Services LLE custom, flat knit Jobst Elvarex classic compression knee highs, ccl 3, and  Elvarex PLUS, ccl 2 toe caps. Trial with Flexitouch sequential pneumatic compression device pending.           Patient will benefit from skilled therapeutic intervention in order to improve the following deficits and impairments:   Body Structure / Function / Physical Skills: ADL, Decreased knowledge of precautions, Mobility, Decreased knowledge of use of DME, Edema, Skin integrity, Pain, IADL       Visit Diagnosis: Lymphedema, not elsewhere classified    Problem List Patient Active Problem List   Diagnosis Date Noted  . Swelling of limb 11/29/2019  . Lymphedema 11/29/2019  . Severe recurrent major depression without psychotic features (HCC) 03/07/2019  . Suicidal ideation 03/06/2019  . Noncompliance 03/06/2019  . Class 1 obesity due to excess calories without serious comorbidity with body mass index (BMI) of 30.0 to 30.9 in adult 02/11/2017  . History of cervical dysplasia 02/11/2017  . Liver lesion, right lobe 10/17/2016  . Von Willebrand's disease (HCC) 11/10/2015  . PCOS  (polycystic ovarian syndrome) 09/20/2015    Loel Dubonnet, MS, OTR/L, [facility] 10/04/20 9:57 AM  [place] [facility] MAIN [facility] SERVICES [address] [place], [place], [address] Phone: [phone]   Fax:  [phone]  Name: CORRISSA MARTELLO MRN: [id number] Date of Birth: 01/03/1991

## 2020-10-10 ENCOUNTER — Ambulatory Visit: Payer: BC Managed Care – PPO | Admitting: Internal Medicine

## 2020-10-10 ENCOUNTER — Other Ambulatory Visit: Payer: Self-pay

## 2020-10-10 ENCOUNTER — Encounter: Payer: Self-pay | Admitting: Internal Medicine

## 2020-10-10 VITALS — BP 116/78 | HR 82 | Temp 98.4°F | Ht 61.0 in | Wt 204.0 lb

## 2020-10-10 DIAGNOSIS — R1013 Epigastric pain: Secondary | ICD-10-CM

## 2020-10-10 DIAGNOSIS — F332 Major depressive disorder, recurrent severe without psychotic features: Secondary | ICD-10-CM

## 2020-10-10 MED ORDER — MIRTAZAPINE 30 MG PO TABS: 30.0000 mg | ORAL_TABLET | Freq: Every day | ORAL | 3 refills | Status: DC

## 2020-10-10 MED ORDER — TRAZODONE HCL 100 MG PO TABS: 100.0000 mg | ORAL_TABLET | Freq: Every evening | ORAL | 2 refills | Status: DC | PRN

## 2020-10-10 MED ORDER — FAMOTIDINE 20 MG PO TABS: 20.0000 mg | ORAL_TABLET | Freq: Two times a day (BID) | ORAL | 0 refills | Status: DC

## 2020-10-10 NOTE — Progress Notes (Signed)
Date:  10/10/2020   Name:  Mallory Johnston   DOB:  05/10/1991   MRN:  [id number]   Chief Complaint: Abdominal Pain (X3 days, mid stomach, pt thinks its something she ate, feels relief when she burps or passes gas, keeping her up at night, painful ) and Referral (psychiatrist/ was going to [facility] but they do not take her insurance [id number])  Abdominal Pain This is a new problem. The current episode started in the past 7 days. The onset quality is sudden. The problem has been gradually improving. The pain is located in the epigastric region. The pain is mild. The quality of the pain is cramping. The abdominal pain does not radiate. Associated symptoms include belching, flatus and nausea. Pertinent negatives include no diarrhea, fever, headaches or vomiting.  Depression        This is a chronic problem.  The problem has been gradually worsening since onset.  Associated symptoms include fatigue, helplessness, insomnia, decreased interest, appetite change, sad and suicidal ideas (fleeting thoughts but no plan).  Associated symptoms include no headaches.  Treatments tried: mirtazepine and trazodone in the past but has not had any for months.  She found several practices that take her insurance but they wait for a new pt appointment is 3 months.  Lab Results  Component Value Date   CREATININE 0.74 09/24/2020   BUN 9 09/24/2020   NA 139 03/05/2019   K 3.4 (L) 03/05/2019   CL 106 03/05/2019   CO2 25 03/05/2019   Lab Results  Component Value Date   CHOL 148 03/08/2019   HDL 52 03/08/2019   LDLCALC 73 03/08/2019   TRIG 115 03/08/2019   CHOLHDL 2.8 03/08/2019   Lab Results  Component Value Date   TSH 1.201 03/08/2019   Lab Results  Component Value Date   HGBA1C 5.3 03/08/2019   Lab Results  Component Value Date   WBC 10.5 03/05/2019   HGB 12.7 03/05/2019   HCT 39.9 03/05/2019   MCV 90.7 03/05/2019   PLT 336 03/05/2019   Lab Results  Component Value Date   ALT 15 03/05/2019     AST 21 03/05/2019   ALKPHOS 65 03/05/2019   BILITOT 0.5 03/05/2019     Review of Systems  Constitutional: Positive for appetite change and fatigue. Negative for fever and unexpected weight change.  Respiratory: Negative for chest tightness, shortness of breath and wheezing.   Cardiovascular: Negative for chest pain and palpitations.  Gastrointestinal: Positive for abdominal pain, flatus and nausea. Negative for blood in stool, diarrhea and vomiting.  Neurological: Negative for dizziness and headaches.  Psychiatric/Behavioral: Positive for depression, dysphoric mood, sleep disturbance and suicidal ideas (fleeting thoughts but no plan). The patient has insomnia. The patient is not nervous/anxious.     Patient Active Problem List   Diagnosis Date Noted  . Swelling of limb 11/29/2019  . Lymphedema 11/29/2019  . Severe recurrent major depression without psychotic features (HCC) 03/07/2019  . Suicidal ideation 03/06/2019  . Noncompliance 03/06/2019  . Class 1 obesity due to excess calories without serious comorbidity with body mass index (BMI) of 30.0 to 30.9 in adult 02/11/2017  . History of cervical dysplasia 02/11/2017  . Liver lesion, right lobe 10/17/2016  . Von Willebrand's disease (HCC) 11/10/2015  . PCOS (polycystic ovarian syndrome) 09/20/2015    Allergies  Allergen Reactions  . Amoxicillin Rash  . Percocet [Oxycodone-Acetaminophen] Nausea And Vomiting and Rash    Past Surgical History:  Procedure Laterality Date  .  BREAST REDUCTION SURGERY    . LOWER EXTREMITY VENOGRAPHY Left 09/24/2020   Procedure: LOWER EXTREMITY VENOGRAPHY;  Surgeon: Annice Needy, MD;  Location: [facility] INVASIVE CV LAB;  Service: Cardiovascular;  Laterality: Left;  . OVARIAN CYST REMOVAL    . TONSILLECTOMY      Social History   Tobacco Use  . Smoking status: Never Smoker  . Smokeless tobacco: Never Used  Vaping Use  . Vaping Use: Never used  Substance Use Topics  . Alcohol use: Yes     Comment: rare  . Drug use: No     Medication list has been reviewed and updated.  Current Meds  Medication Sig  . desogestrel-ethinyl estradiol (MIRCETTE) 0.15-0.02/0.01 MG (21/5) tablet Take 1 tablet by mouth at bedtime.    PHQ 2/9 Scores 10/10/2020 04/08/2019 10/27/2017  PHQ - 2 Score 6 2 0  PHQ- 9 Score 24 13 -    GAD 7 : Generalized Anxiety Score 10/10/2020 04/08/2019  Nervous, Anxious, on Edge 2 3  Control/stop worrying 3 3  Worry too much - different things 3 0  Trouble relaxing 3 1  Restless 2 3  Easily annoyed or irritable 3 1  Afraid - awful might happen 3 2  Total GAD 7 Score 19 13  Anxiety Difficulty Somewhat difficult -    BP Readings from Last 3 Encounters:  10/10/20 116/78  09/24/20 (!) 132/97  09/11/20 124/83    Physical Exam Vitals and nursing note reviewed.  Constitutional:      General: She is not in acute distress.    Appearance: She is well-developed.  HENT:     Head: Normocephalic and atraumatic.  Pulmonary:     Effort: Pulmonary effort is normal. No respiratory distress.  Abdominal:     General: Abdomen is protuberant. Bowel sounds are decreased.     Palpations: Abdomen is soft. There is no shifting dullness, hepatomegaly or splenomegaly.     Tenderness: There is abdominal tenderness in the right upper quadrant and epigastric area. There is no right CVA tenderness, left CVA tenderness, guarding or rebound.  Musculoskeletal:        General: Normal range of motion.  Skin:    General: Skin is warm and dry.     Findings: No rash.  Neurological:     Mental Status: She is alert and oriented to person, place, and time.  Psychiatric:        Behavior: Behavior normal.        Thought Content: Thought content normal.     Wt Readings from Last 3 Encounters:  10/10/20 204 lb (92.5 kg)  09/24/20 205 lb 0.4 oz (93 kg)  09/11/20 205 lb (93 kg)    BP 116/78 (BP Location: Right Arm, Patient Position: Sitting)   Pulse 82   Temp 98.4 F (36.9 C)  (Oral)   Ht 5\' 1"  (1.549 m)   Wt 204 lb (92.5 kg)   LMP 10/05/2020   SpO2 98%   BMI 38.55 kg/m   Assessment and Plan: 1. Epigastric pain Likely due to highly spiced burger - no worrisome features and gradually improving Recommend pepcid bid x 7 days; bland diet If recurrent, would consider gall bladder 12/05/2020 - famotidine (PEPCID) 20 MG tablet; Take 1 tablet (20 mg total) by mouth 2 (two) times daily.  Dispense: 60 tablet; Refill: 0  2. Severe recurrent major depression without psychotic features (HCC) She is instructed to schedule appt with new psychiatrist as soon as possible Will give her 3  mo supply of medication for now - mirtazapine (REMERON) 30 MG tablet; Take 1 tablet (30 mg total) by mouth at bedtime.  Dispense: 30 tablet; Refill: 3 - traZODone (DESYREL) 100 MG tablet; Take 1 tablet (100 mg total) by mouth at bedtime as needed for sleep.  Dispense: 30 tablet; Refill: 2   Partially dictated using Animal nutritionist. Any errors are unintentional.  Bari Edward, MD [facility]  10/10/2020

## 2020-10-10 NOTE — Patient Instructions (Signed)
Psych:  Dr. Caryn Section

## 2020-10-11 ENCOUNTER — Encounter: Payer: Self-pay | Admitting: Physical Therapy

## 2020-10-11 ENCOUNTER — Ambulatory Visit: Payer: BC Managed Care – PPO | Admitting: Occupational Therapy

## 2020-10-11 ENCOUNTER — Encounter: Payer: Self-pay | Admitting: Occupational Therapy

## 2020-10-11 ENCOUNTER — Ambulatory Visit: Payer: BC Managed Care – PPO | Admitting: Physical Therapy

## 2020-10-11 DIAGNOSIS — I890 Lymphedema, not elsewhere classified: Secondary | ICD-10-CM | POA: Diagnosis not present

## 2020-10-11 DIAGNOSIS — M79672 Pain in left foot: Secondary | ICD-10-CM

## 2020-10-11 NOTE — Therapy (Signed)
[place] [facility] MAIN [facility] SERVICES [address] [place], [place], [address] Phone: [phone]   Fax:  [phone]  Physical Therapy Treatment  Patient Details  Name: Mallory Johnston MRN: [id number] Date of Birth: 02/25/1991 No data recorded  Encounter Date: 10/11/2020    Past Medical History:  Diagnosis Date  . H/O bilateral breast reduction surgery   . Lymphadenopathy 2009   left leg  . Lymphedema of left leg   . Ovarian cyst   . S/P tonsillectomy   . Suicidal ideation 03/06/2019    Past Surgical History:  Procedure Laterality Date  . BREAST REDUCTION SURGERY    . LOWER EXTREMITY VENOGRAPHY Left 09/24/2020   Procedure: LOWER EXTREMITY VENOGRAPHY;  Surgeon: Annice Needy, MD;  Location: [facility] INVASIVE CV LAB;  Service: Cardiovascular;  Laterality: Left;  . OVARIAN CYST REMOVAL    . TONSILLECTOMY      There were no vitals filed for this visit.   Subjective Assessment - 10/11/20 1318    Subjective Patietn is getting OT for lymphodema and she is having 7/10 pain.    Currently in Pain? Yes    Pain Score 7     Pain Location Foot    Pain Orientation Left    Pain Descriptors / Indicators Aching    Pain Type Chronic pain    Pain Onset Other (comment)   pain onset with swelling. Exacerbation sept/Oct 2020 w/out known precipitatng event          PT/OT/SLP Screening Form   Time: in  1:00  Time out  1:30   Complaint: Left foot pain Past Medical Hx:  Lymphadenopathy 2015, foot pain began 2015  Injury Date : 6 years ago  Pain Scale: 7/10 left foot lateral top of foot and sometimes is in the ankle Patient's phone number: [phone]  Hx (this occurrence): Six years ago she thinks she was bitten by something like possibly a spider.  She had a fever in her foot and it was swollen and red and it was painful. It would get better but then when she is on her feet it gets worse. She had cellulitis and then became lymphedema. She has been  treated for lymphedema for 6 years and wears stockings.  She had an x-ray a few years ago, Venogram recently and it was negative. Patient is having foot pain that is intermittent. It depends on how long she is working due to standing on her foot. It also depends on what shoes she wears. Shoes that make it worse are tennis shoes.       Assessment:  ROM is limited in DF to neutral  Strength L foot is -3/5 DF/PF, inversion 3/5, eversion -3/5 Pain is 7/10     Recommendations:  Comments:    [x]  Patient would benefit from an MD referral [x]  Patient would benefit from a full PT/OT/ SLP evaluation and treatment. []  No intervention recommended at this time.                                         Patient will benefit from skilled therapeutic intervention in order to improve the following deficits and impairments:     Visit Diagnosis: Pain in left foot     Problem List Patient Active Problem List   Diagnosis Date Noted  . Swelling of limb 11/29/2019  . Lymphedema 11/29/2019  . Severe  recurrent major depression without psychotic features (HCC) 03/07/2019  . Noncompliance 03/06/2019  . Class 1 obesity due to excess calories without serious comorbidity with body mass index (BMI) of 30.0 to 30.9 in adult 02/11/2017  . History of cervical dysplasia 02/11/2017  . Liver lesion, right lobe 10/17/2016  . Von Willebrand's disease (HCC) 11/10/2015  . PCOS (polycystic ovarian syndrome) 09/20/2015    Ezekiel Ina, PT DPT 10/11/2020, 1:20 PM  [facility] MAIN [facility] SERVICES [address] [place], [place], [address] Phone: [phone]   Fax:  [phone]  Name: JAMERIAH TROTTI MRN: [id number] Date of Birth: [birth year]

## 2020-10-11 NOTE — Therapy (Signed)
[place] MAIN [facility] SERVICES [address] [place], [place], [address] Phone: [phone]   Fax:  [phone]  Occupational Therapy Treatment Note and Progress Report: Lymphedema Care  Patient Details  Name: Mallory Johnston MRN: [id number] Date of Birth: 12-13-1991 Referring Provider (OT): Eulogio Ditch, NP   Encounter Date: 10/11/2020   OT End of Session - 10/11/20 0902    Visit Number 27    Number of Visits 36    Date for OT Re-Evaluation 10/07/20    OT Start Time 0814    OT Stop Time 0855    OT Time Calculation (min) 41 min    Activity Tolerance Patient tolerated treatment well;No increased pain    Behavior During Therapy WFL for tasks assessed/performed           Past Medical History:  Diagnosis Date  . H/O bilateral breast reduction surgery   . Lymphadenopathy 2009   left leg  . Lymphedema of left leg   . Ovarian cyst   . S/P tonsillectomy   . Suicidal ideation 03/06/2019    Past Surgical History:  Procedure Laterality Date  . BREAST REDUCTION SURGERY    . LOWER EXTREMITY VENOGRAPHY Left 09/24/2020   Procedure: LOWER EXTREMITY VENOGRAPHY;  Surgeon: Algernon Huxley, MD;  Location: [facility];  Service: Cardiovascular;  Laterality: Left;  . OVARIAN CYST REMOVAL    . TONSILLECTOMY      There were no vitals filed for this visit.   Subjective Assessment - 10/11/20 0820    Subjective  Mallory Johnston presents for OT visit 26/36 to address BLE lymphedema. Pt reports she restarted taking depression medication yesterday. She reports L ankle ankle pain this morning is 8/10. She reports she's been working on her feet full time daily since her last visit with me on 10/04/20.    Currently in Pain? Yes    Pain Score 8     Pain Location Ankle    Pain Orientation Left    Pain Descriptors / Indicators Aching;Heaviness;Throbbing;Sharp;Sore;Shooting    Pain Type Chronic pain                        OT  Treatments/Exercises (OP) - 10/11/20 0001      ADLs   ADL Education Given Yes      Manual Therapy   Manual Therapy Edema management;Manual Lymphatic Drainage (MLD);Compression Bandaging    Manual therapy comments L foot and ankle joint distraction and mobilizations with slight relief    Manual Lymphatic Drainage (MLD) LLE MLD utilizing shrt neck sequence, deep abdominal pathway , functionall inguinal LN and UJ strokes to LLE from proximal to distal.    Compression Bandaging Pt doffs and dons custom LLE compression stocking iindependently    Other Manual Therapy skin care with low ph castor oil for optimal skin stretch during MLD and to restore acidic matrix to skin                  OT Education - 10/11/20 0822    Education Details Continued skilled Pt/caregiver education  And LE ADL training throughout visit for lymphedema self care/ home program, including compression wrapping, compression garment and device wear/care, lymphatic pumping ther ex, simple self-MLD, and skin care. Discussed progress towards goals.    Person(s) Educated Patient    Methods Explanation;Demonstration;Handout    Comprehension Verbalized understanding;Returned demonstration;Need further instruction  OT Long Term Goals - 10/11/20 0900      OT LONG TERM GOAL #1   Title Pt will be able to verbalize signs and symptoms of cellulitis infection , and be able to name 6 common lymphedema precautions using printed resource for reference (modified independence)  to limit LE progression over time .    Baseline Max A    Time 5    Period Days    Status Achieved      OT LONG TERM GOAL #2   Title Pt will retain volumetric reduction achieved during Intensive Phase CDT for 6 months after transitioning to Self Management Phase as evidenced by no greater than 3% limb volume increase when measured for follow up.    Baseline Min A    Time 6    Period Months    Status On-going      OT LONG TERM GOAL  #3   Title Pt will be able to apply BLE, knee length, multi-layer, short stretch compression wraps using correct gradient techniques with modified independence (extra time) to achieve optimal limb volume reduction, and to return affected limb , as closely as possible, to premorbid size and shape.    Baseline Max A    Time 4    Period Days    Status Achieved      OT LONG TERM GOAL #4   Title Pt will achieve at least a 10% BLE limb volume reductions below knees ( ankle- tibial tuberosity A-D) during Intensive Phase CDT to improve AROM at LE joints for functional activities, ambulation and transfers, to reduce infection risk, and to limit LE progression.    Baseline Max A    Time 12    Period Weeks    Status Partially Met   achieved 8.29% decrease in LLE volume below the knee to date   Target Date --   achieved 8.29% decrease in LLE volume below the knee to date     OT LONG TERM GOAL #5   Title Pt will achieve and sustain 85% compliance with daily LE self-care home program components throughout Intensive Phase CDT, including impeccable skin care, lymphatic pumping therex, daily sequential pneumatic pump (one leg per day on alternating legs) and custom compression to ensure optimal limb volume reduction, to limit infection risk and to limit LE progression.    Baseline Max A    Time 12    Period Weeks    Status Achieved      OT LONG TERM GOAL #6   Title Pt will demonstrate modified independence using assistive devices and extra time  during LE self-care training to don and doff properly fitting, custom compression garments/devices with Max A for optimal LE self-management over time.    Baseline Max A    Time 12    Period Days    Status Achieved                 Plan - 10/11/20 0906    Clinical Impression Statement This visit marks end of Intensive Phase CDT and transition to Self-Management Phase. Pt has achieved 8.29% decrease in LLE volume below the knee, which does not meet 10%  reduction goal, but approaches it. Pain level since commencing OT has not decreased in L foot. Skin condition and fibrosis reduction is palpable. Pt is fit with an appropriate custom compression stocking and toe cap, which conmtrol limb swelling well to limit LE progression. Fitting with an advanced, sequential pneumatic compression device, or Flexitouch "pump",  is pending while awaiting insurace approval. Pt is independent with all LE sef0care components and remains greater than 85% compliant at present. She will attend PT screening today to further assess foot and ankle pain reorted as 8/10 today. Ori will return to OT in 4 weeks for follow along and wil call PRN. [employer] rep for Flexitouch will communicate status update on the Flexitouch device.    Occupational performance deficits (Please refer to evaluation for details): ADL's;IADL's;Work;Leisure;Social Participation;Other    Body Structure / Function / Physical Skills ADL;Decreased knowledge of precautions;Mobility;Decreased knowledge of use of DME;Edema;Skin integrity;Pain;IADL    Rehab Potential Good    Clinical Decision Making Several treatment options, min-mod task modification necessary    Comorbidities Affecting Occupational Performance: May have comorbidities impacting occupational performance    Modification or Assistance to Complete Evaluation  No modification of tasks or assist necessary to complete eval    OT Treatment/Interventions Self-care/ADL training;Therapeutic exercise;Manual lymph drainage;Patient/family education;Compression bandaging;Therapist, nutritional;Therapeutic activities;Manual Therapy;DME and/or AE instruction    Plan Complete Decomgestive Therapy, Intensive and Self-Manageent Phase support: Manual lymphatic drainage (MLD) skin care, ther ex, compression wraps and garments. Consider    Recommended Other Services LLE custom, flat knit Jobst Elvarex classic compression knee highs, ccl 3, and  Elvarex PLUS,  ccl 2 toe caps. Trial with Flexitouch sequential pneumatic compression device pending.           Patient will benefit from skilled therapeutic intervention in order to improve the following deficits and impairments:   Body Structure / Function / Physical Skills: ADL, Decreased knowledge of precautions, Mobility, Decreased knowledge of use of DME, Edema, Skin integrity, Pain, IADL       Visit Diagnosis: Lymphedema, not elsewhere classified    Problem List Patient Active Problem List   Diagnosis Date Noted  . Swelling of limb 11/29/2019  . Lymphedema 11/29/2019  . Severe recurrent major depression without psychotic features ([place]) 03/07/2019  . Noncompliance 03/06/2019  . Class 1 obesity due to excess calories without serious comorbidity with body mass index (BMI) of 30.0 to 30.9 in adult 02/11/2017  . History of cervical dysplasia 02/11/2017  . Liver lesion, right lobe 10/17/2016  . Von Willebrand's disease ([place]) 11/10/2015  . PCOS (polycystic ovarian syndrome) 09/20/2015    Mallory Spearman, MS, OTR/L, [facility] 10/11/20 9:14 AM  [place] MAIN [facility] SERVICES [place], [place], [address] Phone: [phone]   Fax:  [phone]  Name: JALEYAH LONGHI MRN: [id number] Date of Birth: 09/11/1991

## 2020-10-18 ENCOUNTER — Ambulatory Visit: Payer: BC Managed Care – PPO | Admitting: Occupational Therapy

## 2020-10-25 ENCOUNTER — Encounter: Payer: BC Managed Care – PPO | Admitting: Occupational Therapy

## 2020-11-01 ENCOUNTER — Encounter: Payer: BC Managed Care – PPO | Admitting: Occupational Therapy

## 2020-11-01 ENCOUNTER — Other Ambulatory Visit: Payer: Self-pay | Admitting: Internal Medicine

## 2020-11-01 DIAGNOSIS — F332 Major depressive disorder, recurrent severe without psychotic features: Secondary | ICD-10-CM

## 2020-11-01 DIAGNOSIS — R1013 Epigastric pain: Secondary | ICD-10-CM

## 2020-11-01 NOTE — Telephone Encounter (Signed)
Requested medication (s) are due for refill today: yes  Requested medication (s) are on the active medication list: yes  Last refill:  10/10/20  #60  0 refills  Future visit scheduled: No  Notes to clinic: Office visit note 10/10/20 states take  BID for 7 days. Order written for BID 30day amount. Is the patient to continue this medication?    Requested Prescriptions  Pending Prescriptions Disp Refills   famotidine (PEPCID) 20 MG tablet [Pharmacy Med Name: FAMOTIDINE 20 MG TABLET] 60 tablet 0    Sig: TAKE 1 TABLET BY MOUTH TWICE A DAY      Gastroenterology:  H2 Antagonists Passed - 11/01/2020 12:31 PM      Passed - Valid encounter within last 12 months    Recent Outpatient Visits           3 weeks ago Epigastric pain   [facility] Reubin Milan, MD   10 months ago Pneumonia due to COVID-19 virus   [facility] Reubin Milan, MD   1 year ago Severe recurrent major depression without psychotic features [facility])   [facility] Reubin Milan, MD   2 years ago Cyst of skin   [facility] Reubin Milan, MD   3 years ago Sleep disorder breathing   [facility] Reubin Milan, MD       Future Appointments             In 7 months Logan Bores, Ellsworth Lennox, MD [facility]

## 2020-11-13 ENCOUNTER — Encounter (INDEPENDENT_AMBULATORY_CARE_PROVIDER_SITE_OTHER): Payer: Self-pay

## 2020-11-14 ENCOUNTER — Telehealth: Payer: Self-pay

## 2020-11-14 NOTE — Telephone Encounter (Signed)
Copied from CRM [id number]. Topic: Appointment Scheduling - Scheduling Inquiry for Clinic >> Nov 14, 2020  9:26 AM Leafy Ro wrote: Reason for CRM:Pt is calling and per pt was told to schedule an appt with her PCP. Pt has seen dr Vivia Birmingham. Pt is having trouble walking due to lymphedema of left leg. The next available appt with dr berglund is 11-20-2020

## 2020-11-14 NOTE — Telephone Encounter (Signed)
Spoke to pt and she will contact Dr. Rondell Reams office.

## 2020-11-14 NOTE — Telephone Encounter (Signed)
She sees Dr. Wyn Quaker.  She does not need to see me for this.

## 2020-11-15 ENCOUNTER — Ambulatory Visit: Payer: BC Managed Care – PPO | Attending: Nurse Practitioner | Admitting: Occupational Therapy

## 2020-11-15 ENCOUNTER — Other Ambulatory Visit: Payer: Self-pay

## 2020-11-15 DIAGNOSIS — I890 Lymphedema, not elsewhere classified: Secondary | ICD-10-CM

## 2020-11-15 NOTE — Therapy (Signed)
[place] MAIN [facility] SERVICES [address] [place], [place], [address] Phone: [phone]   Fax:  [phone]  Occupational Therapy Treatment  Patient Details  Name: Mallory Johnston MRN: [id number] Date of Birth: 01-23-1991 Referring Provider (OT): Eulogio Ditch, NP   Encounter Date: 11/15/2020   OT End of Session - 11/15/20 1214    Visit Number 28    Number of Visits 36    Date for OT Re-Evaluation 01/09/21    OT Start Time 0815    OT Stop Time 0900    OT Time Calculation (min) 45 min    Activity Tolerance Patient tolerated treatment well;No increased pain    Behavior During Therapy WFL for tasks assessed/performed           Past Medical History:  Diagnosis Date  . H/O bilateral breast reduction surgery   . Lymphadenopathy 2009   left leg  . Lymphedema of left leg   . Ovarian cyst   . S/P tonsillectomy   . Suicidal ideation 03/06/2019    Past Surgical History:  Procedure Laterality Date  . BREAST REDUCTION SURGERY    . LOWER EXTREMITY VENOGRAPHY Left 09/24/2020   Procedure: LOWER EXTREMITY VENOGRAPHY;  Surgeon: Algernon Huxley, MD;  Location: [facility];  Service: Cardiovascular;  Laterality: Left;  . OVARIAN CYST REMOVAL    . TONSILLECTOMY      There were no vitals filed for this visit.   Subjective Assessment - 11/15/20 0816    Subjective  Mallory Johnston presents for OT visit 27/36 to address BLE lymphedema. Mallory Johnston was last seen for OT LE care on 10/11/20. Pt reports leg swelling is well controlled with compression garments worn daily as directed. Pt reports persistent L foot pain and swelling continues to limit functional ambulation and mobility, basic and instrumental ADLs and work-related activities. Medical record shows xray imaging in 2015 was unremarkable. Pt reports pain and swelling continues to worsen over time.    Pertinent History H/O ovarian cyst, tonsilectomy, B breast reduction; works 2 jobs standing and  walking,    Limitations chronic leg swelling and associated pain, difficulty walking. impaired standing tolerance, dependent sitting, car transfers,  impaired body image,    Repetition Increases Symptoms    Special Tests +Stemmer L>R    Patient Stated Goals That I'll "....be able to walk and stand without pain, and get the swelling down... so I can get out of these comression socks because I like to wear dresses over the summer."    Currently in Pain? Yes    Pain Score 6     Pain Location Foot    Pain Orientation Left    Pain Descriptors / Indicators Tender;Aching;Constant;Crushing;Crying;Discomfort;Heaviness;Nagging;Pounding;Pressure;Radiating;Restless;Shooting;Sore;Spasm;Squeezing;Stabbing;Throbbing;Tiring    Pain Type Chronic pain    Pain Onset Other (comment)   >2 years. worsening w time              LYMPHEDEMA/ONCOLOGY QUESTIONNAIRE - 11/15/20 0001      Left Lower Extremity Lymphedema   Other LLE ankle to tibial tuberosisty = 4575.5 ml.    Other LLE A-D volume is increased increased today by 3% since last measured on 06/07/20. Overall limb volume reduction for L leg measures 5.17% since commencing OT for CDT on 04/09/20`. These values do not meet 10% reduction goal, but the 3% increase measured today does meet the long term reduction goal of no more than 3%  OT Treatments/Exercises (OP) - 11/15/20 0001      ADLs   ADL Education Given Yes      Manual Therapy   Manual Therapy Edema management    Edema Management LLE comparative volumetrics-ankle to knee (A-D)                  OT Education - 11/15/20 1212    Education Details Reviewed all LE self care program protocols. Pt has mastered these, understand LE progression and  and knows signs and symptoms of cellulitis.    Person(s) Educated Patient    Methods Explanation;Demonstration;Handout    Comprehension Verbalized understanding;Returned demonstration;Need further instruction                OT Long Term Goals - 11/15/20 1200      OT LONG TERM GOAL #1   Title Pt will be able to verbalize signs and symptoms of cellulitis infection , and be able to name 6 common lymphedema precautions using printed resource for reference (modified independence)  to limit LE progression over time .    Baseline Max A    Time 5    Period Days    Status Achieved      OT LONG TERM GOAL #2   Title Pt will retain volumetric reduction achieved during Intensive Phase CDT for 6 months after transitioning to Self Management Phase as evidenced by no greater than 3% limb volume increase when measured for follow up.    Baseline Min A    Time 6    Period Months    Status Achieved      OT LONG TERM GOAL #3   Title Pt will be able to apply BLE, knee length, multi-layer, short stretch compression wraps using correct gradient techniques with modified independence (extra time) to achieve optimal limb volume reduction, and to return affected limb , as closely as possible, to premorbid size and shape.    Baseline Max A    Time 4    Period Days    Status Achieved      OT LONG TERM GOAL #4   Title Pt will achieve at least a 10% BLE limb volume reductions below knees ( ankle- tibial tuberosity A-D) during Intensive Phase CDT to improve AROM at LE joints for functional activities, ambulation and transfers, to reduce infection risk, and to limit LE progression.    Baseline Max A    Time 12    Period Weeks    Status Partially Met   8.29% decr L leg volum during Intensive. 3% incr since 10/16     OT LONG TERM GOAL #5   Title Pt will achieve and sustain 85% compliance with daily LE self-care home program components throughout Intensive Phase CDT, including impeccable skin care, lymphatic pumping therex, daily sequential pneumatic pump (one leg per day on alternating legs) and custom compression to ensure optimal limb volume reduction, to limit infection risk and to limit LE progression.    Baseline Max A     Time 12    Period Weeks    Status Achieved      OT LONG TERM GOAL #6   Title Pt will demonstrate modified independence using assistive devices and extra time  during LE self-care training to don and doff properly fitting, custom compression garments/devices with Max A for optimal LE self-management over time.    Baseline Max A    Time 12    Period Days    Status Achieved  Plan - 11/15/20 1219    Clinical Impression Statement Pt last seen on 10/11/20. She has remained >85% compliant with compression garments and elevation when possible since last seen. Skin is in excellent condition without signs/ symptoms of infection. L foot remains swollen on all surfaces with chronic pain persisting at rest and worsening w weight bearing. Stemmer sign remains positive on dorsal L foot. R foot is negative . Custom compression garment is in good codition. Pt understands replacement schedule and I encouraged her to return in 3 months for a garment check and new measurements.Janisse will benefit from repeat imaging of L foot and ankle as chronic pain , swelling and decreased ankle and foot ROM have worsened over time , despite OT for LE care.  LChronic L fot and ankle pain and swelling continue to limit functional ambulation and mobility, and limit's Ms. Fischl's functional performance in all occupational domains. 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 Ms. Mcquitty will return for OT follow up in 3 onths and call PRN.    Occupational performance deficits (Please refer to evaluation for details): ADL's;IADL's;Work;Leisure;Social Participation;Other    Body Structure / Function / Physical Skills ADL;Decreased knowledge of precautions;Mobility;Decreased knowledge of use of DME;Edema;Skin integrity;Pain;IADL    Rehab Potential Good    Clinical Decision Making Several treatment options, min-mod task modification  necessary    Comorbidities Affecting Occupational Performance: May have comorbidities impacting occupational performance    Modification or Assistance to Complete Evaluation  No modification of tasks or assist necessary to complete eval    OT Treatment/Interventions Self-care/ADL training;Therapeutic exercise;Manual lymph drainage;Patient/family education;Compression bandaging;Therapist, nutritional;Therapeutic activities;Manual Therapy;DME and/or AE instruction    Plan Complete Decomgestive Therapy, Intensive and Self-Manageent Phase support: Manual lymphatic drainage (MLD) skin care, ther ex, compression wraps and garments. Consider    Recommended Other Services LLE custom, flat knit [employer] Elvarex classic compression knee highs, ccl 3, and  Elvarex PLUS, ccl 2 toe caps. Trial with Flexitouch sequential pneumatic compression device pending.           Patient will benefit from skilled therapeutic intervention in order to improve the following deficits and impairments:   Body Structure / Function / Physical Skills: ADL, Decreased knowledge of precautions, Mobility, Decreased knowledge of use of DME, Edema, Skin integrity, Pain, IADL       Visit Diagnosis: Lymphedema, not elsewhere classified    Problem List Patient Active Problem List   Diagnosis Date Noted  . Swelling of limb 11/29/2019  . Lymphedema 11/29/2019  . Severe recurrent major depression without psychotic features ([place]) 03/07/2019  . Noncompliance 03/06/2019  . Class 1 obesity due to excess calories without serious comorbidity with body mass index (BMI) of 30.0 to 30.9 in adult 02/11/2017  . History of cervical dysplasia 02/11/2017  . Liver lesion, right lobe 10/17/2016  . Von Willebrand's disease ([place]) 11/10/2015  . PCOS (polycystic ovarian syndrome) 09/20/2015   Andrey Spearman, MS, OTR/L, [facility] 11/15/20 12:30 PM   [place] MAIN [facility] SERVICES [address]  [place], [place], [address] Phone: [phone]   Fax:  [phone]  Name: Mallory Johnston MRN: [id number] Date of Birth: 1991-05-03

## 2020-12-01 ENCOUNTER — Other Ambulatory Visit: Payer: Self-pay | Admitting: Internal Medicine

## 2020-12-01 DIAGNOSIS — F332 Major depressive disorder, recurrent severe without psychotic features: Secondary | ICD-10-CM

## 2020-12-10 ENCOUNTER — Other Ambulatory Visit: Payer: Self-pay | Admitting: Internal Medicine

## 2020-12-10 DIAGNOSIS — R1013 Epigastric pain: Secondary | ICD-10-CM

## 2020-12-14 ENCOUNTER — Other Ambulatory Visit: Payer: Self-pay | Admitting: Internal Medicine

## 2020-12-14 DIAGNOSIS — R1013 Epigastric pain: Secondary | ICD-10-CM

## 2020-12-25 ENCOUNTER — Ambulatory Visit
Admission: RE | Admit: 2020-12-25 | Discharge: 2020-12-25 | Disposition: A | Discharge status: Home / Self Care | Payer: BC Managed Care – PPO | Source: Ambulatory Visit | Attending: Nurse Practitioner | Admitting: Nurse Practitioner

## 2020-12-25 ENCOUNTER — Other Ambulatory Visit: Payer: Self-pay

## 2020-12-25 ENCOUNTER — Encounter (INDEPENDENT_AMBULATORY_CARE_PROVIDER_SITE_OTHER): Payer: Self-pay | Admitting: Nurse Practitioner

## 2020-12-25 ENCOUNTER — Ambulatory Visit
Admission: RE | Admit: 2020-12-25 | Discharge: 2020-12-25 | Disposition: A | Discharge status: Home / Self Care | Payer: BC Managed Care – PPO | Attending: Nurse Practitioner | Admitting: Nurse Practitioner

## 2020-12-25 ENCOUNTER — Ambulatory Visit (INDEPENDENT_AMBULATORY_CARE_PROVIDER_SITE_OTHER): Payer: BC Managed Care – PPO | Admitting: Nurse Practitioner

## 2020-12-25 VITALS — BP 117/79 | HR 93 | Resp 16 | Wt 211.0 lb

## 2020-12-25 DIAGNOSIS — M25572 Pain in left ankle and joints of left foot: Secondary | ICD-10-CM | POA: Insufficient documentation

## 2020-12-25 DIAGNOSIS — I890 Lymphedema, not elsewhere classified: Secondary | ICD-10-CM | POA: Diagnosis not present

## 2020-12-31 ENCOUNTER — Encounter (INDEPENDENT_AMBULATORY_CARE_PROVIDER_SITE_OTHER): Payer: Self-pay

## 2021-01-07 NOTE — Progress Notes (Signed)
Subjective:    Patient ID: Mallory Johnston, female    DOB: May 26, 1991, 30 y.o.   MRN: [id number] Chief Complaint  Patient presents with  . Follow-up    [facility] 3 month post le venography    The patient presents today for follow-up after a left lower extremity venogram.  Venogram was to explore for any possible areas of stenosis that could be causing patient's edema.  No obvious stenotic areas were found during venogram.  Currently the patient's edema is fairly well controlled except for her foot.  The patient has been very diligent when it comes to her compression garments.  She wears them on a daily basis in addition to elevating her lower extremity she also uses her lymphedema pump and goes to the occupational therapy lymphedema clinic.  She continues to have pain and swelling in her left foot.  Despite her compression garments.  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  But even with a medical note allowing her to sit and elevate for portion of the day she continues to have severe swelling.  Patient notes that the swelling and pain is so bad that she can barely walk at the end of her shifts.  The pain and swelling in her foot have actually been ongoing for several years.  Previous x-rays did not find any obvious abnormality.  The patient denies any obvious injury.   Review of Systems  Cardiovascular: Positive for leg swelling.  Musculoskeletal: Positive for arthralgias.  All other systems reviewed and are negative.      Objective:   Physical Exam Vitals reviewed.  HENT:     Head: Normocephalic.  Cardiovascular:     Rate and Rhythm: Normal rate.     Pulses: Normal pulses.  Pulmonary:     Effort: Pulmonary effort is normal.  Musculoskeletal:     Left lower leg: Edema present.  Neurological:     Mental Status: She is alert and oriented to person, place, and time.  Psychiatric:        Mood and Affect: Mood normal.        Behavior: Behavior  normal.        Thought Content: Thought content normal.        Judgment: Judgment normal.     BP 117/79 (BP Location: Right Arm)   Pulse 93   Resp 16   Wt 211 lb (95.7 kg)   LMP 12/21/2020   BMI 39.87 kg/m   Past Medical History:  Diagnosis Date  . H/O bilateral breast reduction surgery   . Lymphadenopathy 2009   left leg  . Lymphedema of left leg   . Ovarian cyst   . S/P tonsillectomy   . Suicidal ideation 03/06/2019    Social History   Socioeconomic History  . Marital status: Single    Spouse name: Not on file  . Number of children: Not on file  . Years of education: Not on file  . Highest education level: Not on file  Occupational History  . Not on file  Tobacco Use  . Smoking status: Never Smoker  . Smokeless tobacco: Never Used  Vaping Use  . Vaping Use: Never used  Substance and Sexual Activity  . Alcohol use: Yes    Comment: rare  . Drug use: No  . Sexual activity: Yes    Birth control/protection: Pill, Condom  Other Topics Concern  . Not on file  Social History Narrative  .  Not on file   Social Determinants of Health   Financial Resource Strain: Not on file  Food Insecurity: Not on file  Transportation Needs: Not on file  Physical Activity: Not on file  Stress: Not on file  Social Connections: Not on file  Intimate Partner Violence: Not on file    Past Surgical History:  Procedure Laterality Date  . BREAST REDUCTION SURGERY    . LOWER EXTREMITY VENOGRAPHY Left 09/24/2020   Procedure: LOWER EXTREMITY VENOGRAPHY;  Surgeon: Annice Needy, MD;  Location: [facility] INVASIVE CV LAB;  Service: Cardiovascular;  Laterality: Left;  . OVARIAN CYST REMOVAL    . TONSILLECTOMY      Family History  Problem Relation Age of Onset  . Hypertension Father   . Diabetes Mother   . Breast cancer Neg Hx   . Ovarian cancer Neg Hx   . Colon cancer Neg Hx     Allergies  Allergen Reactions  . Amoxicillin Rash  . Percocet [Oxycodone-Acetaminophen] Nausea And  Vomiting and Rash    CBC Latest Ref Rng & Units 03/05/2019 01/20/2019 05/19/2014  WBC 4.0 - 10.5 K/uL 10.5 11.0(H) 13.0(H)  Hemoglobin 12.0 - 15.0 g/dL 32.3 55.7 32.2  Hematocrit 36.0 - 46.0 % 39.9 40.5 41.7  Platelets 150 - 400 K/uL 336 273 277      CMP     Component Value Date/Time   NA 139 03/05/2019 1935   NA 137 05/19/2014 0206   K 3.4 (L) 03/05/2019 1935   K 4.0 05/19/2014 0206   CL 106 03/05/2019 1935   CL 103 05/19/2014 0206   CO2 25 03/05/2019 1935   CO2 28 05/19/2014 0206   GLUCOSE 96 03/05/2019 1935   GLUCOSE 93 05/19/2014 0206   BUN 9 09/24/2020 0759   BUN 10 05/19/2014 0206   CREATININE 0.74 09/24/2020 0759   CREATININE 0.88 05/19/2014 0206   CALCIUM 9.2 03/05/2019 1935   CALCIUM 9.2 05/19/2014 0206   PROT 9.0 (H) 03/05/2019 1935   PROT 8.8 (H) 03/04/2013 1510   ALBUMIN 4.5 03/05/2019 1935   ALBUMIN 3.9 03/04/2013 1510   AST 21 03/05/2019 1935   AST 76 (H) 03/04/2013 1510   ALT 15 03/05/2019 1935   ALT 97 (H) 03/04/2013 1510   ALKPHOS 65 03/05/2019 1935   ALKPHOS 107 03/04/2013 1510   BILITOT 0.5 03/05/2019 1935   BILITOT 0.3 03/04/2013 1510   GFRNONAA >60 09/24/2020 0759   GFRNONAA >60 05/19/2014 0206   GFRAA >60 09/24/2020 0759   GFRAA >60 05/19/2014 0206     No results found.     Assessment & Plan:   1. Pain of joint of left ankle and foot X-rays of the patient's left foot showed only some mild chronic sclerotic changes.  However, it is not normal for the patient to have this severe foot pain.  The patient's pain is also reproducible upon palpation.  The patient denies any evaluation by podiatry or any further work-up past x-rays.  We will refer the patient to podiatry for evaluation of the cause of longstanding foot pain. - DG Ankle Complete Left; Future - DG Foot Complete Left; Future - Ambulatory referral to Podiatry  2. Lymphedema Discussed the swelling that is ongoing in her left foot her lymphedema has been doing well.  The patient will  continue with conservative therapy such as using her compression garments elevate her lower extremity and exercising as possible.  She should also continue with aggressive use of her lymphedema pump.  We  will have the patient return to the office in 6 months for evaluation sooner if necessary.   Current Outpatient Medications on File Prior to Visit  Medication Sig Dispense Refill  . desogestrel-ethinyl estradiol (MIRCETTE) 0.15-0.02/0.01 MG (21/5) tablet Take 1 tablet by mouth at bedtime. 84 tablet 3  . famotidine (PEPCID) 20 MG tablet TAKE 1 TABLET BY MOUTH TWICE A DAY 60 tablet 0  . mirtazapine (REMERON) 30 MG tablet Take 1 tablet (30 mg total) by mouth at bedtime. 30 tablet 3  . traZODone (DESYREL) 100 MG tablet Take 1 tablet (100 mg total) by mouth at bedtime as needed for sleep. 30 tablet 2   No current facility-administered medications on file prior to visit.    There are no Patient Instructions on file for this visit. No follow-ups on file.   Georgiana Spinner, NP

## 2021-01-09 ENCOUNTER — Other Ambulatory Visit: Payer: Self-pay | Admitting: Internal Medicine

## 2021-01-09 DIAGNOSIS — F332 Major depressive disorder, recurrent severe without psychotic features: Secondary | ICD-10-CM

## 2021-01-11 ENCOUNTER — Encounter: Payer: Self-pay | Admitting: Internal Medicine

## 2021-01-11 ENCOUNTER — Ambulatory Visit: Payer: BC Managed Care – PPO | Admitting: Internal Medicine

## 2021-01-11 ENCOUNTER — Other Ambulatory Visit: Payer: Self-pay

## 2021-01-11 VITALS — BP 120/86 | HR 90 | Temp 98.2°F | Ht 61.0 in | Wt 214.0 lb

## 2021-01-11 DIAGNOSIS — G43009 Migraine without aura, not intractable, without status migrainosus: Secondary | ICD-10-CM | POA: Diagnosis not present

## 2021-01-11 DIAGNOSIS — R42 Dizziness and giddiness: Secondary | ICD-10-CM

## 2021-01-11 DIAGNOSIS — D6800 Von Willebrand disease, unspecified: Secondary | ICD-10-CM

## 2021-01-11 MED ORDER — MECLIZINE HCL 12.5 MG PO TABS: 12.5000 mg | ORAL_TABLET | Freq: Every day | ORAL | 0 refills | Status: DC

## 2021-01-11 NOTE — Progress Notes (Signed)
Date:  01/11/2021   Name:  Mallory Johnston   DOB:  05/29/1991   MRN:  [id number]   Chief Complaint: Dizziness (X1.5 months, last for a few mins, happens when sitting or laying down, feels like the room is spinning, hard to focus /)  Dizziness This is a new problem. The current episode started 1 to 4 weeks ago. The problem occurs intermittently (6-7 total episodes). Progression since onset: each episode lasting 1 minute. Associated symptoms include headaches and vertigo. Pertinent negatives include no chest pain, chills, congestion, coughing, fatigue, fever, nausea, numbness, sore throat, visual change, vomiting or weakness. Nothing aggravates the symptoms.  Headache  This is a recurrent problem. The problem occurs intermittently. The problem has been unchanged. Associated symptoms include dizziness and tinnitus. Pertinent negatives include no coughing, fever, nausea, numbness, sinus pressure, sore throat, visual change, vomiting or weakness. Nothing aggravates the symptoms. She has tried acetaminophen for the symptoms. The treatment provided significant relief.    Lab Results  Component Value Date   CREATININE 0.74 09/24/2020   BUN 9 09/24/2020   NA 139 03/05/2019   K 3.4 (L) 03/05/2019   CL 106 03/05/2019   CO2 25 03/05/2019   Lab Results  Component Value Date   CHOL 148 03/08/2019   HDL 52 03/08/2019   LDLCALC 73 03/08/2019   TRIG 115 03/08/2019   CHOLHDL 2.8 03/08/2019   Lab Results  Component Value Date   TSH 1.201 03/08/2019   Lab Results  Component Value Date   HGBA1C 5.3 03/08/2019   Lab Results  Component Value Date   WBC 10.5 03/05/2019   HGB 12.7 03/05/2019   HCT 39.9 03/05/2019   MCV 90.7 03/05/2019   PLT 336 03/05/2019   Lab Results  Component Value Date   ALT 15 03/05/2019   AST 21 03/05/2019   ALKPHOS 65 03/05/2019   BILITOT 0.5 03/05/2019     Review of Systems  Constitutional: Negative for chills, fatigue and fever.  HENT: Positive for  tinnitus. Negative for congestion, sinus pressure, sinus pain and sore throat.   Respiratory: Negative for cough, chest tightness and shortness of breath.   Cardiovascular: Negative for chest pain.  Gastrointestinal: Negative for nausea and vomiting.  Neurological: Positive for dizziness, vertigo and headaches. Negative for syncope, weakness and numbness.    Patient Active Problem List   Diagnosis Date Noted  . Swelling of limb 11/29/2019  . Lymphedema 11/29/2019  . Severe recurrent major depression without psychotic features (HCC) 03/07/2019  . Noncompliance 03/06/2019  . Class 1 obesity due to excess calories without serious comorbidity with body mass index (BMI) of 30.0 to 30.9 in adult 02/11/2017  . History of cervical dysplasia 02/11/2017  . Liver lesion, right lobe 10/17/2016  . Von Willebrand's disease (HCC) 11/10/2015  . PCOS (polycystic ovarian syndrome) 09/20/2015    Allergies  Allergen Reactions  . Amoxicillin Rash  . Percocet [Oxycodone-Acetaminophen] Nausea And Vomiting and Rash    Past Surgical History:  Procedure Laterality Date  . BREAST REDUCTION SURGERY    . LOWER EXTREMITY VENOGRAPHY Left 09/24/2020   Procedure: LOWER EXTREMITY VENOGRAPHY;  Surgeon: Annice Needy, MD;  Location: [facility] INVASIVE CV LAB;  Service: Cardiovascular;  Laterality: Left;  . OVARIAN CYST REMOVAL    . TONSILLECTOMY      Social History   Tobacco Use  . Smoking status: Never Smoker  . Smokeless tobacco: Never Used  Vaping Use  . Vaping Use: Never used  Substance Use  Topics  . Alcohol use: Yes    Comment: rare  . Drug use: No     Medication list has been reviewed and updated.  Current Meds  Medication Sig  . desogestrel-ethinyl estradiol (MIRCETTE) 0.15-0.02/0.01 MG (21/5) tablet Take 1 tablet by mouth at bedtime.  . mirtazapine (REMERON) 30 MG tablet Take 1 tablet (30 mg total) by mouth at bedtime.  Marland Kitchen zolpidem (AMBIEN) 5 MG tablet Take 5 mg by mouth at bedtime as needed.     PHQ 2/9 Scores 01/11/2021 10/10/2020 04/08/2019 10/27/2017  PHQ - 2 Score 0 6 2 0  PHQ- 9 Score 6 24 13  -    GAD 7 : Generalized Anxiety Score 01/11/2021 10/10/2020 04/08/2019  Nervous, Anxious, on Edge 3 2 3   Control/stop worrying 3 3 3   Worry too much - different things 3 3 0  Trouble relaxing 3 3 1   Restless 2 2 3   Easily annoyed or irritable 3 3 1   Afraid - awful might happen 3 3 2   Total GAD 7 Score 20 19 13   Anxiety Difficulty Not difficult at all Somewhat difficult -    BP Readings from Last 3 Encounters:  01/11/21 120/86  12/25/20 117/79  10/10/20 116/78    Physical Exam Vitals and nursing note reviewed.  Constitutional:      General: She is not in acute distress.    Appearance: Normal appearance. She is well-developed.  HENT:     Head: Normocephalic and atraumatic.     Right Ear: Tympanic membrane and ear canal normal.     Left Ear: Tympanic membrane and ear canal normal.     Nose:     Right Sinus: No maxillary sinus tenderness or frontal sinus tenderness.     Left Sinus: No maxillary sinus tenderness or frontal sinus tenderness.  Eyes:     Extraocular Movements:     Right eye: Nystagmus present.     Left eye: Nystagmus present.  Cardiovascular:     Rate and Rhythm: Normal rate and regular rhythm.  Pulmonary:     Effort: Pulmonary effort is normal. No respiratory distress.     Breath sounds: No wheezing or rhonchi.  Musculoskeletal:        General: Normal range of motion.     Cervical back: Normal range of motion.  Lymphadenopathy:     Cervical: No cervical adenopathy.  Skin:    General: Skin is warm and dry.     Findings: No rash.  Neurological:     Mental Status: She is alert and oriented to person, place, and time.  Psychiatric:        Attention and Perception: Attention normal.        Mood and Affect: Mood and affect normal.     Wt Readings from Last 3 Encounters:  01/11/21 214 lb (97.1 kg)  12/25/20 211 lb (95.7 kg)  10/10/20 204 lb (92.5  kg)    BP 120/86   Pulse (!) 108   Temp 98.2 F (36.8 C) (Oral)   Ht 5\' 1"  (1.549 m)   Wt 214 lb (97.1 kg)   LMP 12/21/2020   SpO2 98%   BMI 40.43 kg/m   Assessment and Plan: 1. Vertigo Take meclizine at bedtime daily and PRN if sx worsen will refer to ENT - meclizine (ANTIVERT) 12.5 MG tablet; Take 1 tablet (12.5 mg total) by mouth at bedtime.  Dispense: 30 tablet; Refill: 0  2. Migraine without aura and without status migrainosus, not intractable Continue Tylenol  as needed Keep a headache diary and return if worsening  3. Von Willebrand's disease [facility]) Avoid all nsaids for risk is bleeding   Partially dictated using Animal nutritionist. Any errors are unintentional.  Bari Edward, MD [facility]  01/11/2021

## 2021-01-15 ENCOUNTER — Other Ambulatory Visit: Payer: Self-pay

## 2021-01-15 ENCOUNTER — Ambulatory Visit: Payer: BC Managed Care – PPO | Admitting: Podiatry

## 2021-01-15 ENCOUNTER — Encounter: Payer: Self-pay | Admitting: Podiatry

## 2021-01-15 DIAGNOSIS — Q6672 Congenital pes cavus, left foot: Secondary | ICD-10-CM | POA: Diagnosis not present

## 2021-01-15 DIAGNOSIS — Q667 Congenital pes cavus, unspecified foot: Secondary | ICD-10-CM

## 2021-01-15 DIAGNOSIS — T148XXA Other injury of unspecified body region, initial encounter: Secondary | ICD-10-CM | POA: Diagnosis not present

## 2021-01-15 NOTE — Progress Notes (Signed)
Subjective:  Patient ID: Mallory Johnston, female    DOB: August 10, 1991,  MRN: [id number]  Chief Complaint  Patient presents with  . Foot Pain    Patient presents today for left midfoot/ankle pain    30 y.o. female presents with the above complaint.  Patient presents with complaint of left midfoot ankle pain that has been going on for 6+ years.  Patient states she has a constant issue of lymphedema for which she wears compression socks.  She is constantly working on her foot.  She is sharp stabbing pain especially when walking a lot.  It throbs at times.  She has tried treating it with massage and Tylenol none of which has helped.  She has not seen anyone else prior to seeing me.  She had a x-ray done at an outside office by her primary care doctor which did not show anything.  She denies any other acute complaints.  She would like to discuss treatment options.   Review of Systems: Negative except as noted in the HPI. Denies N/V/F/Ch.  Past Medical History:  Diagnosis Date  . H/O bilateral breast reduction surgery   . Lymphadenopathy 2009   left leg  . Lymphedema of left leg   . Ovarian cyst   . S/P tonsillectomy   . Suicidal ideation 03/06/2019    Current Outpatient Medications:  .  desogestrel-ethinyl estradiol (MIRCETTE) 0.15-0.02/0.01 MG (21/5) tablet, Take 1 tablet by mouth at bedtime., Disp: 84 tablet, Rfl: 3 .  meclizine (ANTIVERT) 12.5 MG tablet, Take 1 tablet (12.5 mg total) by mouth at bedtime., Disp: 30 tablet, Rfl: 0 .  mirtazapine (REMERON) 30 MG tablet, Take 1 tablet (30 mg total) by mouth at bedtime., Disp: 30 tablet, Rfl: 3 .  zolpidem (AMBIEN) 5 MG tablet, Take 5 mg by mouth at bedtime as needed., Disp: , Rfl:   Social History   Tobacco Use  Smoking Status Never Smoker  Smokeless Tobacco Never Used    Allergies  Allergen Reactions  . Amoxicillin Rash  . Percocet [Oxycodone-Acetaminophen] Nausea And Vomiting and Rash   Objective:  There were no vitals filed for  this visit. There is no height or weight on file to calculate BMI. Constitutional Well developed. Well nourished.  Vascular Dorsalis pedis pulses palpable bilaterally. Posterior tibial pulses palpable bilaterally. Capillary refill normal to all digits.  No cyanosis or clubbing noted. Pedal hair growth normal.  Neurologic Normal speech. Oriented to person, place, and time. Epicritic sensation to light touch grossly present bilaterally.  Dermatologic Nails well groomed and normal in appearance. No open wounds. No skin lesions.  Orthopedic:  Generalized soft tissue pain noted across the lower extremity with non pitting edema to left lower extremity.  Pain along the ankle joint as well as medial lateral foot along the peroneal and tibial tendon.  Pain at the Achilles tendon as well.  No pain in the forefoot.  Patient has a semiflexible pes cavus foot structure.  No hammertoe contractures noted.  Tight Achilles noted.   Radiographs: No fracture or malalignment. Mild chronic sclerosis base of fifth proximal phalanx. The joint spaces appear within normal limits. There is no evidence of fracture, dislocation, or joint effusion. There is no evidence of arthropathy or other focal bone abnormality. Soft tissues are unremarkable. Assessment:   1. Pes cavus   2. Contusion of soft tissue    Plan:  Patient was evaluated and treated and all questions answered.  Generalized soft tissue contusion secondary to lymphedema -I explained to  the patient the etiology of contusion various treatment options were discussed.  The primary culprit likely may be lymphedema leading to excessive pressure in the underlying soft tissue therefore contusion.  Given that she is constantly on her foot is likely the other culprit without good shoes as well as orthotic.  Given that she also has a pes cavus foot structure I believe she will benefit from custom-made orthotics as well. -Since she is having a lot of pain and I  am unable to focalized her pain given the generalized pain across the lower extremity I believe patient will benefit from cam boot immobilization to allow the soft tissue to heal appropriately and for [place] to focalized some of her pain.  Patient agrees with the plan -Cam boot was dispensed  Pes cavus -I explained to the patient the etiology of pes cavus foot structure and various treatment options were discussed.  Given that she is having generalized pain to the lower extremity likely due to no support in the shoes I believe she will benefit from orthotics.  She will be scheduled to see [facility] for custom-made orthotics.  No follow-ups on file.

## 2021-01-17 ENCOUNTER — Ambulatory Visit: Payer: BC Managed Care – PPO | Admitting: Podiatry

## 2021-01-30 ENCOUNTER — Other Ambulatory Visit: Payer: Self-pay

## 2021-01-30 ENCOUNTER — Ambulatory Visit (INDEPENDENT_AMBULATORY_CARE_PROVIDER_SITE_OTHER): Payer: BC Managed Care – PPO | Admitting: Orthotics

## 2021-01-30 DIAGNOSIS — T148XXA Other injury of unspecified body region, initial encounter: Secondary | ICD-10-CM

## 2021-01-30 DIAGNOSIS — Q6672 Congenital pes cavus, left foot: Secondary | ICD-10-CM

## 2021-01-30 DIAGNOSIS — Q6671 Congenital pes cavus, right foot: Secondary | ICD-10-CM

## 2021-01-30 DIAGNOSIS — I890 Lymphedema, not elsewhere classified: Secondary | ICD-10-CM

## 2021-01-30 DIAGNOSIS — Q667 Congenital pes cavus, unspecified foot: Secondary | ICD-10-CM

## 2021-01-30 NOTE — Progress Notes (Signed)
Patient came in today for evaluation/assessment custom foot orthotics.  Patient presents foot pain and discomfort associated with plantar fasciitis.  Patient has noted pes cavus foot type with also rear foot varus deformity. ...Goal is to "bring ground up" with contoured arch support, and 4 degree valgus RF and FF posting.     

## 2021-02-12 ENCOUNTER — Ambulatory Visit (INDEPENDENT_AMBULATORY_CARE_PROVIDER_SITE_OTHER): Payer: BC Managed Care – PPO | Admitting: Podiatry

## 2021-02-12 ENCOUNTER — Other Ambulatory Visit: Payer: Self-pay

## 2021-02-12 ENCOUNTER — Ambulatory Visit: Payer: BC Managed Care – PPO | Admitting: Podiatry

## 2021-02-12 ENCOUNTER — Encounter: Payer: Self-pay | Admitting: Podiatry

## 2021-02-12 DIAGNOSIS — M7752 Other enthesopathy of left foot: Secondary | ICD-10-CM | POA: Diagnosis not present

## 2021-02-12 DIAGNOSIS — S93402A Sprain of unspecified ligament of left ankle, initial encounter: Secondary | ICD-10-CM

## 2021-02-12 MED ORDER — METHYLPREDNISOLONE 4 MG PO TBPK: ORAL_TABLET | ORAL | 0 refills | Status: DC

## 2021-02-12 NOTE — Progress Notes (Signed)
Subjective:  Patient ID: Mallory Johnston, female    DOB: May 12, 1991,  MRN: [id number]  Chief Complaint  Patient presents with  . Foot Pain    "Its a little better, still hurts if Im on my foot a lot"    30 y.o. female presents with the above complaint.  Patient presents with complaint of left lateral ankle pain.  Patient states the pain is now more localized to the lateral part of the ankle.  She has been ambulating with a cam boot which helped a little bit but still very painful.  She still constantly on her foot.  She denies any other acute complaints.   Review of Systems: Negative except as noted in the HPI. Denies N/V/F/Ch.  Past Medical History:  Diagnosis Date  . H/O bilateral breast reduction surgery   . Lymphadenopathy 2009   left leg  . Lymphedema of left leg   . Ovarian cyst   . S/P tonsillectomy   . Suicidal ideation 03/06/2019    Current Outpatient Medications:  .  methylPREDNISolone (MEDROL DOSEPAK) 4 MG TBPK tablet, Use as directed, Disp: 1 each, Rfl: 0 .  desogestrel-ethinyl estradiol (MIRCETTE) 0.15-0.02/0.01 MG (21/5) tablet, Take 1 tablet by mouth at bedtime., Disp: 84 tablet, Rfl: 3 .  meclizine (ANTIVERT) 12.5 MG tablet, Take 1 tablet (12.5 mg total) by mouth at bedtime., Disp: 30 tablet, Rfl: 0 .  mirtazapine (REMERON) 30 MG tablet, Take 1 tablet (30 mg total) by mouth at bedtime., Disp: 30 tablet, Rfl: 3 .  zolpidem (AMBIEN) 5 MG tablet, Take 5 mg by mouth at bedtime as needed., Disp: , Rfl:   Social History   Tobacco Use  Smoking Status Never Smoker  Smokeless Tobacco Never Used    Allergies  Allergen Reactions  . Amoxicillin Rash  . Percocet [Oxycodone-Acetaminophen] Nausea And Vomiting and Rash   Objective:  There were no vitals filed for this visit. There is no height or weight on file to calculate BMI. Constitutional Well developed. Well nourished.  Vascular Dorsalis pedis pulses palpable bilaterally. Posterior tibial pulses palpable  bilaterally. Capillary refill normal to all digits.  No cyanosis or clubbing noted. Pedal hair growth normal.  Neurologic Normal speech. Oriented to person, place, and time. Epicritic sensation to light touch grossly present bilaterally.  Dermatologic Nails well groomed and normal in appearance. No open wounds. No skin lesions.  Orthopedic:  Pain is now localized more to the left lateral ankle pain with dorsiflexion plantarflexion resisted.  Mild pain to the midfoot.  There is still edema present nonpitting to the left lower extremity.  Patient has a semiflexible pes cavus foot structure.  No hammertoe contractures noted.  Tight Achilles noted.   Radiographs: No fracture or malalignment. Mild chronic sclerosis base of fifth proximal phalanx. The joint spaces appear within normal limits. There is no evidence of fracture, dislocation, or joint effusion. There is no evidence of arthropathy or other focal bone abnormality. Soft tissues are unremarkable. Assessment:   1. Contusion of soft tissue   2. Moderate ankle sprain, left, initial encounter    Plan:  Patient was evaluated and treated and all questions answered.  Left lateral ankle sprain/ATFL ligament with underlying capsulitis -Explained to the patient the etiology of ankle sprain versus were discussed.  Patient does have a history of ankle sprain and likely pain due to ligament strain.  I discussed with her that given the amount of pain she is having I believe she is compensating along with lymphedema leading to  a lot of pain in the soft tissue.  Given that there is a lot of inflammatory pain I believe she will benefit from a steroid injection to help with that patient agrees with the plan would like to proceed with a steroid injection.  She can start transitioning out of the cam boot into a Tri-Lock brace if she is able to.  Tri-Lock brace was dispensed -A steroid injection was performed at left lateral ankle using 1% plain Lidocaine  and 10 mg of Kenalog. This was well tolerated.   Pes cavus -I explained to the patient the etiology of pes cavus foot structure and various treatment options were discussed.  Given that she is having generalized pain to the lower extremity likely due to no support in the shoes I believe she will benefit from orthotics.  She will be scheduled to see [facility] for custom-made orthotics.  No follow-ups on file.

## 2021-02-14 ENCOUNTER — Other Ambulatory Visit: Payer: Self-pay

## 2021-02-14 ENCOUNTER — Ambulatory Visit: Payer: BC Managed Care – PPO | Attending: Nurse Practitioner | Admitting: Occupational Therapy

## 2021-02-14 DIAGNOSIS — I890 Lymphedema, not elsewhere classified: Secondary | ICD-10-CM | POA: Insufficient documentation

## 2021-02-14 NOTE — Therapy (Signed)
[place] MAIN [facility] SERVICES [address] [place], [place], [address] Phone: [phone]   Fax:  [phone]  Occupational Therapy Treatment  Patient Details  Name: Mallory Johnston MRN: [id number] Date of Birth: 07/16/1991 Referring Provider (OT): Eulogio Ditch, NP   Encounter Date: 02/14/2021   OT End of Session - 02/14/21 0946    Visit Number 29    Number of Visits 36    Date for OT Re-Evaluation 05/15/21    OT Start Time 0905    OT Stop Time 0940    OT Time Calculation (min) 35 min    Activity Tolerance Patient tolerated treatment well;No increased pain    Behavior During Therapy WFL for tasks assessed/performed           Past Medical History:  Diagnosis Date  . H/O bilateral breast reduction surgery   . Lymphadenopathy 2009   left leg  . Lymphedema of left leg   . Ovarian cyst   . S/P tonsillectomy   . Suicidal ideation 03/06/2019    Past Surgical History:  Procedure Laterality Date  . BREAST REDUCTION SURGERY    . LOWER EXTREMITY VENOGRAPHY Left 09/24/2020   Procedure: LOWER EXTREMITY VENOGRAPHY;  Surgeon: Algernon Huxley, MD;  Location: [facility];  Service: Cardiovascular;  Laterality: Left;  . OVARIAN CYST REMOVAL    . TONSILLECTOMY      There were no vitals filed for this visit.   Subjective Assessment - 02/14/21 0947    Subjective  Mallory Johnston presents for OT visit 87/36 for follow up on  BLE lymphedema, L>R. Mallory Johnston was last seen for OT LE care on 11/15/20.Marland Kitchen Pt reports she wears custom  compression knee highs and toe caps daily as directed. Pt reports persistent L foot pain and swelling continues to limit functional ambulation and mobility, basic and instrumental ADLs and work-related activities. She saw Dr Mallory Johnston recently at [facility] and is diagnosed with capsulitis/ ankle sprain/ contusion. She underwent a steroid injection to reduce inflammation and is in the process of being fitted with orthotics.  Pt does not rate L ankle pain numerically. She presents this mornng with LLE compression garments in place under ankle brace issued by Dr Mallory Johnston.    Pertinent History H/O ovarian cyst, tonsilectomy, B breast reduction; works 2 jobs standing and walking,    Limitations chronic leg swelling and associated pain, difficulty walking. impaired standing tolerance, dependent sitting, car transfers,  impaired body image,    Repetition Increases Symptoms    Special Tests +Stemmer L>R    Patient Stated Goals That I'll "....be able to walk and stand without pain, and get the swelling down... so I can get out of these comression socks because I like to wear dresses over the summer."    Pain Onset Other (comment)   >2 years. worsening w time              LYMPHEDEMA/ONCOLOGY QUESTIONNAIRE - 02/14/21 0953      Left Lower Extremity Lymphedema   Other LLE volume from ankle to tibial tuberosisty (A-D) = 4819.8 ml.    Other LLE A-D volume is increased increased today by 5.3 % since last measured on 11/21. Since commencing OT for CDT on 04/09/20 LLE A-D limb volume is decreased by 0.11%, which is essentially unchanged and stable.                   OT Treatments/Exercises (OP) - 02/14/21 1103  ADLs   ADL Education Given Yes      Manual Therapy   Manual Therapy Edema management;Other (comment)    Edema Management LLE comparative volumetrics-ankle to knee (A-D)    Compression Bandaging garment condition assessment                  OT Education - 02/14/21 1004    Education Details Continued skilled Pt/caregiver education  And LE ADL training throughout visit for lymphedema self care/ home program, including compression wrapping, compression garment and device wear/care, lymphatic pumping ther ex, simple self-MLD, and skin care. Discussed progress towards goals.    Person(s) Educated Patient    Methods Explanation;Demonstration;Handout    Comprehension Verbalized  understanding;Returned demonstration;Need further instruction               OT Long Term Goals - 11/15/20 1200      OT LONG TERM GOAL #1   Title Pt will be able to verbalize signs and symptoms of cellulitis infection , and be able to name 6 common lymphedema precautions using printed resource for reference (modified independence)  to limit LE progression over time .    Baseline Max A    Time 5    Period Days    Status Achieved      OT LONG TERM GOAL #2   Title Pt will retain volumetric reduction achieved during Intensive Phase CDT for 6 months after transitioning to Self Management Phase as evidenced by no greater than 3% limb volume increase when measured for follow up.    Baseline Min A    Time 6    Period Months    Status Achieved      OT LONG TERM GOAL #3   Title Pt will be able to apply BLE, knee length, multi-layer, short stretch compression wraps using correct gradient techniques with modified independence (extra time) to achieve optimal limb volume reduction, and to return affected limb , as closely as possible, to premorbid size and shape.    Baseline Max A    Time 4    Period Days    Status Achieved      OT LONG TERM GOAL #4   Title Pt will achieve at least a 10% BLE limb volume reductions below knees ( ankle- tibial tuberosity A-D) during Intensive Phase CDT to improve AROM at LE joints for functional activities, ambulation and transfers, to reduce infection risk, and to limit LE progression.    Baseline Max A    Time 12    Period Weeks    Status Partially Met   8.29% decr L leg volum during Intensive. 3% incr since 10/16     OT LONG TERM GOAL #5   Title Pt will achieve and sustain 85% compliance with daily LE self-care home program components throughout Intensive Phase CDT, including impeccable skin care, lymphatic pumping therex, daily sequential pneumatic pump (one leg per day on alternating legs) and custom compression to ensure optimal limb volume reduction, to  limit infection risk and to limit LE progression.    Baseline Max A    Time 12    Period Weeks    Status Achieved      OT LONG TERM GOAL #6   Title Pt will demonstrate modified independence using assistive devices and extra time  during LE self-care training to don and doff properly fitting, custom compression garments/devices with Max A for optimal LE self-management over time.    Baseline Max A    Time  12    Period Days    Status Achieved                 Plan - 02/14/21 0957    Clinical Impression Statement Pt last seen on 11/15/20. She has remained >85% compliant with compression garments and elevation when possible since last seen by report. Skin is in excellent condition without signs/ symptoms of infection. L foot remains mildly swollen with chronic ankle pain persisting. Stemmer sign remains positive on dorsal L foot. R foot is negative . Custom L compression knee high is in good codition. Toe cap needs replacement.  LLE comparative limb volumetrics from A-D reveals LLE A-D volume is increased increased today by 5.3 % since last measured on 11/21. Since commencing OT for CDT on 04/09/20 LLE A-D limb volume is decreased overall by 0.11%, which is essentially unchanged and stable. Vonya agrees with plan to return to OT for replacement toe caps measurements next month, and to return for fitting visits to ensure optimal effectiveness and comfort. Manufacturer's rep from [employer] is re-running Pt's benefits for Flexitouch since she was unable to afford this device when we looked into it for her in the past.    OT Occupational Profile and History Comprehensive Assessment- Review of records and extensive additional review of physical, cognitive, psychosocial history related to current functional performance    Occupational performance deficits (Please refer to evaluation for details): ADL's;IADL's;Work;Leisure;Social Participation;Other    Body Structure / Function / Physical Skills  ADL;Decreased knowledge of precautions;Mobility;Decreased knowledge of use of DME;Edema;Skin integrity;Pain;IADL    Rehab Potential Good    Clinical Decision Making Several treatment options, min-mod task modification necessary    Comorbidities Affecting Occupational Performance: May have comorbidities impacting occupational performance    Modification or Assistance to Complete Evaluation  No modification of tasks or assist necessary to complete eval    OT Frequency Other (comment)    OT Duration --   6 visits for custom cmpression garment measurements and fittings   OT Treatment/Interventions Self-care/ADL training;Patient/family education;Compression bandaging;Therapeutic activities;Manual Therapy;DME and/or AE instruction;Manual lymph drainage;Other (comment)   compression garment measurement and fitting   Plan Complete Decomgestive Therapy, Intensive and Self-Manageent Phase support: Manual lymphatic drainage (MLD) skin care, ther ex, compression wraps and garments. Consider    Recommended Other Services LLE custom, flat knit Jobst Elvarex classic compression knee highs, ccl 3, and  Elvarex PLUS, ccl 2 toe caps. Trial with Flexitouch sequential pneumatic compression device pending.    Consulted and Agree with Plan of Care Patient           Patient will benefit from skilled therapeutic intervention in order to improve the following deficits and impairments:   Body Structure / Function / Physical Skills: ADL,Decreased knowledge of precautions,Mobility,Decreased knowledge of use of DME,Edema,Skin integrity,Pain,IADL       Visit Diagnosis: Lymphedema, not elsewhere classified - Plan: Ot plan of care cert/re-cert    Problem List Patient Active Problem List   Diagnosis Date Noted  . Migraine without aura and without status migrainosus, not intractable 01/11/2021  . Swelling of limb 11/29/2019  . Lymphedema 11/29/2019  . Noncompliance 03/06/2019  . Class 1 obesity due to excess  calories without serious comorbidity with body mass index (BMI) of 30.0 to 30.9 in adult 02/11/2017  . History of cervical dysplasia 02/11/2017  . Liver lesion, right lobe 10/17/2016  . Von Willebrand's disease ([place]) 11/10/2015  . PCOS (polycystic ovarian syndrome) 09/20/2015    Andrey Spearman, MS, OTR/L, CLT-[name]  02/14/21 10:07 AM   [place] MAIN [facility] SERVICES [address] [place], [place], [address] Phone: [phone]   Fax:  [phone]  Name: Mallory Johnston MRN: [id number] Date of Birth: 1991-07-06

## 2021-03-07 ENCOUNTER — Ambulatory Visit: Payer: BC Managed Care – PPO | Attending: Nurse Practitioner | Admitting: Occupational Therapy

## 2021-03-07 ENCOUNTER — Other Ambulatory Visit: Payer: Self-pay

## 2021-03-07 DIAGNOSIS — I890 Lymphedema, not elsewhere classified: Secondary | ICD-10-CM | POA: Insufficient documentation

## 2021-03-07 NOTE — Therapy (Signed)
[place] MAIN [facility] SERVICES [address] [place], [place], [address] Phone: [phone]   Fax:  [phone]  Occupational Therapy Treatment Note and Progress Report Reporting Period 08/02/20 - 03/07/21  Patient Details  Name: Mallory Johnston MRN: [id number] Date of Birth: 11/11/1991 Referring Provider (OT): Eulogio Ditch, NP   Encounter Date: 03/07/2021   OT End of Session - 03/07/21 1109    Visit Number 30    Number of Visits 36    Date for OT Re-Evaluation 05/15/21    OT Start Time 1009    OT Stop Time 1046    OT Time Calculation (min) 37 min    Activity Tolerance Patient tolerated treatment well;No increased pain    Behavior During Therapy WFL for tasks assessed/performed           Past Medical History:  Diagnosis Date  . H/O bilateral breast reduction surgery   . Lymphadenopathy 2009   left leg  . Lymphedema of left leg   . Ovarian cyst   . S/P tonsillectomy   . Suicidal ideation 03/06/2019    Past Surgical History:  Procedure Laterality Date  . BREAST REDUCTION SURGERY    . LOWER EXTREMITY VENOGRAPHY Left 09/24/2020   Procedure: LOWER EXTREMITY VENOGRAPHY;  Surgeon: Algernon Huxley, MD;  Location: [place];  Service: Cardiovascular;  Laterality: Left;  . OVARIAN CYST REMOVAL    . TONSILLECTOMY      There were no vitals filed for this visit.   Subjective Assessment - 03/07/21 1056    Subjective  Momoko presents for OT visit 30/36 for management phase support for LLE lymphedema. Pt's foot and ankle pain is unchanged. She does not rate L pain numerically today. Plan for today is to complete repeat measurements for replacement custom LLE compression garments.    Pertinent History H/O ovarian cyst, tonsilectomy, B breast reduction; works 2 jobs standing and walking,    Limitations chronic leg swelling and associated pain, difficulty walking. impaired standing tolerance, dependent sitting, car transfers,  impaired body  image,    Repetition Increases Symptoms    Special Tests +Stemmer L>R    Patient Stated Goals That I'll "....be able to walk and stand without pain, and get the swelling down... so I can get out of these comression socks because I like to wear dresses over the summer."    Pain Onset Other (comment)   >2 years. worsening w time                       OT Treatments/Exercises (OP) - 03/07/21 1105      ADLs   ADL Education Given Yes      Manual Therapy   Manual Therapy Edema management;Other (comment)    Edema Management anatomical measurements for LLE custom compression garment replacements    Compression Bandaging Pt agrees to utilize LLE, knee length, short stretch gradient wrap for HOS                  OT Education - 03/07/21 1107    Education Details Continued skilled Pt/caregiver education  And LE ADL training throughout visit for lymphedema self care/ home program, including compression wrapping, compression garment and device wear/care, lymphatic pumping ther ex, simple self-MLD, and skin care. Discussed progress towards goals.    Person(s) Educated Patient    Methods Explanation;Demonstration;Handout    Comprehension Verbalized understanding;Returned demonstration  OT Long Term Goals - 03/07/21 1048      OT LONG TERM GOAL #1   Title Pt will be able to verbalize signs and symptoms of cellulitis infection , and be able to name 6 common lymphedema precautions using printed resource for reference (modified independence)  to limit LE progression over time .    Baseline Max A    Time 5    Period Days    Status Achieved      OT LONG TERM GOAL #2   Title Pt will retain volumetric reduction achieved during Intensive Phase CDT for 6 months after transitioning to Self Management Phase as evidenced by no greater than 3% limb volume increase when measured for follow up.    Baseline Min A    Time 6    Period Months    Status Achieved      OT  LONG TERM GOAL #3   Title Pt will be able to apply BLE, knee length, multi-layer, short stretch compression wraps using correct gradient techniques with modified independence (extra time) to achieve optimal limb volume reduction, and to return affected limb , as closely as possible, to premorbid size and shape.    Baseline Max A    Time 4    Period Days    Status Achieved      OT LONG TERM GOAL #4   Title Pt will achieve at least a 10% BLE limb volume reductions below knees ( ankle- tibial tuberosity A-D) during Intensive Phase CDT to improve AROM at LE joints for functional activities, ambulation and transfers, to reduce infection risk, and to limit LE progression.    Baseline Max A    Time 12    Period Weeks    Status Partially Met   8.29% decr L leg volum during Intensive. 3% incr since 10/16     OT LONG TERM GOAL #5   Title Pt will achieve and sustain 85% compliance with daily LE self-care home program components throughout Intensive Phase CDT, including impeccable skin care, lymphatic pumping therex, daily sequential pneumatic pump (one leg per day on alternating legs) and custom compression to ensure optimal limb volume reduction, to limit infection risk and to limit LE progression.    Baseline Max A    Time 12    Period Weeks    Status Achieved      OT LONG TERM GOAL #6   Title Pt will demonstrate modified independence using assistive devices and extra time  during LE self-care training to don and doff properly fitting, custom compression garments/devices with Max A for optimal LE self-management over time.    Baseline Max A    Time 12    Period Days    Status Achieved      OT LONG TERM GOAL #7   Title Pt will replace worn out compression garments q 3-6 months and PRN    Baseline Max A    Time 6    Period Months    Status New    Target Date 06/05/21                 Plan - 03/07/21 1058    Clinical Impression Statement Completed anatomical measurements for  replacement LLE compression knee high and toe cap as existing garments are worn out. Garment order faxed to DME vendor after session. Pt will schedule return visit for fitting. She continues to do very well with LE self-management . She is compliant with skin care, ther  ex, daily compression. It's unclear if  she performs daily self MLD. I encouraged her to follow up with Jerrell Mylar at [employer] to see if she qualifies for insurance benefits for the Flexitouch sequential pneumatic cmpression device, or "pump". She did not pursue the device after trial we completed in the past, but I feel it would be beneficial in turms of reducing limb volume at foot and ankle, limiting further fibrosis formation essential for optimal AROM, and for limiting LE progression. Provided Mr. Spaulding's phone contact and let her know he is aware she may be calling. Cont as per POC. See Long Term Goals section for details progress to date. New goal added today for garment replacement routine.    OT Occupational Profile and History Comprehensive Assessment- Review of records and extensive additional review of physical, cognitive, psychosocial history related to current functional performance    Occupational performance deficits (Please refer to evaluation for details): ADL's;IADL's;Work;Leisure;Social Participation;Other    Body Structure / Function / Physical Skills ADL;Decreased knowledge of precautions;Mobility;Decreased knowledge of use of DME;Edema;Skin integrity;Pain;IADL    Rehab Potential Good    Clinical Decision Making Several treatment options, min-mod task modification necessary    Comorbidities Affecting Occupational Performance: May have comorbidities impacting occupational performance    Modification or Assistance to Complete Evaluation  No modification of tasks or assist necessary to complete eval    OT Frequency Other (comment)    OT Duration --   6 visits for custom cmpression garment measurements and  fittings   OT Treatment/Interventions Self-care/ADL training;Patient/family education;Compression bandaging;Therapeutic activities;Manual Therapy;DME and/or AE instruction;Manual lymph drainage;Other (comment)   compression garment measurement and fitting   Plan Complete Decomgestive Therapy, Intensive and Self-Manageent Phase support: Manual lymphatic drainage (MLD) skin care, ther ex, compression wraps and garments. Consider    Recommended Other Services LLE custom, flat knit Jobst Elvarex classic compression knee highs, ccl 3, and  Elvarex PLUS, ccl 2 toe caps. Trial with Flexitouch sequential pneumatic compression device pending.    Consulted and Agree with Plan of Care Patient           Patient will benefit from skilled therapeutic intervention in order to improve the following deficits and impairments:   Body Structure / Function / Physical Skills: ADL,Decreased knowledge of precautions,Mobility,Decreased knowledge of use of DME,Edema,Skin integrity,Pain,IADL       Visit Diagnosis: Lymphedema, not elsewhere classified    Problem List Patient Active Problem List   Diagnosis Date Noted  . Migraine without aura and without status migrainosus, not intractable 01/11/2021  . Swelling of limb 11/29/2019  . Lymphedema 11/29/2019  . Noncompliance 03/06/2019  . Class 1 obesity due to excess calories without serious comorbidity with body mass index (BMI) of 30.0 to 30.9 in adult 02/11/2017  . History of cervical dysplasia 02/11/2017  . Liver lesion, right lobe 10/17/2016  . Von Willebrand's disease ([place]) 11/10/2015  . PCOS (polycystic ovarian syndrome) 09/20/2015    Andrey Spearman, MS, OTR/L, [facility] 03/07/21 11:30 AM  [place] MAIN [facility] SERVICES [address] [place], [place], [address] Phone: [phone]   Fax:  [phone]  Name: SHARNIECE GIBBON MRN: [id number] Date of Birth: 28-Jun-1991

## 2021-03-14 ENCOUNTER — Ambulatory Visit: Payer: BC Managed Care – PPO | Admitting: Podiatry

## 2021-03-21 ENCOUNTER — Ambulatory Visit: Payer: BC Managed Care – PPO | Admitting: Podiatry

## 2021-03-21 ENCOUNTER — Other Ambulatory Visit: Payer: Self-pay

## 2021-03-21 ENCOUNTER — Encounter: Payer: Self-pay | Admitting: Podiatry

## 2021-03-21 DIAGNOSIS — S93402A Sprain of unspecified ligament of left ankle, initial encounter: Secondary | ICD-10-CM | POA: Diagnosis not present

## 2021-03-21 DIAGNOSIS — M7752 Other enthesopathy of left foot: Secondary | ICD-10-CM | POA: Diagnosis not present

## 2021-03-21 DIAGNOSIS — Q667 Congenital pes cavus, unspecified foot: Secondary | ICD-10-CM

## 2021-03-21 NOTE — Patient Instructions (Signed)

## 2021-03-21 NOTE — Progress Notes (Signed)
Subjective:  Patient ID: Mallory Johnston, female    DOB: November 27, 1991,  MRN: [id number]  Chief Complaint  Patient presents with  . Ankle Pain    "its ok, It has good and bad days"   PUO    30 y.o. female presents with the above complaint.  Patient presents with complaint of left lateral ankle capsulitis.  Patient states she is feeling better the injection helps a lot she now has good or bad days.  She is here to get her orthotics as well.  She denies any other acute complaints.   Review of Systems: Negative except as noted in the HPI. Denies N/V/F/Ch.  Past Medical History:  Diagnosis Date  . H/O bilateral breast reduction surgery   . Lymphadenopathy 2009   left leg  . Lymphedema of left leg   . Ovarian cyst   . S/P tonsillectomy   . Suicidal ideation 03/06/2019    Current Outpatient Medications:  .  desogestrel-ethinyl estradiol (MIRCETTE) 0.15-0.02/0.01 MG (21/5) tablet, Take 1 tablet by mouth at bedtime., Disp: 84 tablet, Rfl: 3 .  meclizine (ANTIVERT) 12.5 MG tablet, Take 1 tablet (12.5 mg total) by mouth at bedtime., Disp: 30 tablet, Rfl: 0 .  methylPREDNISolone (MEDROL DOSEPAK) 4 MG TBPK tablet, Use as directed, Disp: 1 each, Rfl: 0 .  mirtazapine (REMERON) 30 MG tablet, Take 1 tablet (30 mg total) by mouth at bedtime., Disp: 30 tablet, Rfl: 3 .  zolpidem (AMBIEN) 5 MG tablet, Take 5 mg by mouth at bedtime as needed., Disp: , Rfl:   Social History   Tobacco Use  Smoking Status Never Smoker  Smokeless Tobacco Never Used    Allergies  Allergen Reactions  . Amoxicillin Rash  . Percocet [Oxycodone-Acetaminophen] Nausea And Vomiting and Rash   Objective:  There were no vitals filed for this visit. There is no height or weight on file to calculate BMI. Constitutional Well developed. Well nourished.  Vascular Dorsalis pedis pulses palpable bilaterally. Posterior tibial pulses palpable bilaterally. Capillary refill normal to all digits.  No cyanosis or clubbing  noted. Pedal hair growth normal.  Neurologic Normal speech. Oriented to person, place, and time. Epicritic sensation to light touch grossly present bilaterally.  Dermatologic Nails well groomed and normal in appearance. No open wounds. No skin lesions.  Orthopedic:  Mild pain is now localized more to the left lateral ankle mild pain with dorsiflexion plantarflexion resisted.  Mild pain to the midfoot.  There is still edema present nonpitting to the left lower extremity.  Patient has a semiflexible pes cavus foot structure.  No hammertoe contractures noted.  Tight Achilles noted.   Radiographs: No fracture or malalignment. Mild chronic sclerosis base of fifth proximal phalanx. The joint spaces appear within normal limits. There is no evidence of fracture, dislocation, or joint effusion. There is no evidence of arthropathy or other focal bone abnormality. Soft tissues are unremarkable. Assessment:   1. Capsulitis of left ankle   2. Moderate ankle sprain, left, initial encounter   3. Pes cavus    Plan:  Patient was evaluated and treated and all questions answered.  Left lateral ankle sprain/ATFL ligament with underlying capsulitis -Clinically improved with a steroid injection.  At this time I will continue to discuss shoe gear modification as well as trying orthotics.  She still has some residual pain I will hold off on another steroid injection even though it did help considerably.  If her pain does not get better with orthotics we will discuss doing another  steroid injection during next clinical visit.   Pes cavus -I explained to the patient the etiology of pes cavus foot structure and various treatment options were discussed.  Given that she is having generalized pain to the lower extremity likely due to no support in the shoes I believe she will benefit from orthotics.  -Orthotics were dispensed  No follow-ups on file.

## 2021-04-30 ENCOUNTER — Other Ambulatory Visit: Payer: Self-pay

## 2021-04-30 ENCOUNTER — Ambulatory Visit: Payer: BC Managed Care – PPO | Admitting: Podiatry

## 2021-04-30 ENCOUNTER — Encounter: Payer: Self-pay | Admitting: Podiatry

## 2021-04-30 DIAGNOSIS — T148XXA Other injury of unspecified body region, initial encounter: Secondary | ICD-10-CM | POA: Diagnosis not present

## 2021-04-30 DIAGNOSIS — S93402A Sprain of unspecified ligament of left ankle, initial encounter: Secondary | ICD-10-CM

## 2021-04-30 NOTE — Progress Notes (Signed)
Subjective:  Patient ID: Mallory Johnston, female    DOB: 1991/03/14,  MRN: [id number]  Chief Complaint  Patient presents with  . Ankle Pain    "it still hurts when I work, but I have been on vacation so it feels better now.  The orthotics are doing good"    30 y.o. female presents with the above complaint.  Patient presents with continuous left lateral ankle pain.  Patient states injection does help however she would like to continue holding off of it.  She states that is feeling a little bit better while she was on vacation however she notices still good amount of pain when she is ambulating.  The orthotics are functioning well.   Review of Systems: Negative except as noted in the HPI. Denies N/V/F/Ch.  Past Medical History:  Diagnosis Date  . H/O bilateral breast reduction surgery   . Lymphadenopathy 2009   left leg  . Lymphedema of left leg   . Ovarian cyst   . S/P tonsillectomy   . Suicidal ideation 03/06/2019    Current Outpatient Medications:  .  desogestrel-ethinyl estradiol (MIRCETTE) 0.15-0.02/0.01 MG (21/5) tablet, Take 1 tablet by mouth at bedtime., Disp: 84 tablet, Rfl: 3 .  meclizine (ANTIVERT) 12.5 MG tablet, Take 1 tablet (12.5 mg total) by mouth at bedtime., Disp: 30 tablet, Rfl: 0 .  methylPREDNISolone (MEDROL DOSEPAK) 4 MG TBPK tablet, Use as directed, Disp: 1 each, Rfl: 0 .  mirtazapine (REMERON) 30 MG tablet, Take 1 tablet (30 mg total) by mouth at bedtime., Disp: 30 tablet, Rfl: 3 .  zolpidem (AMBIEN) 5 MG tablet, Take 5 mg by mouth at bedtime as needed., Disp: , Rfl:   Social History   Tobacco Use  Smoking Status Never Smoker  Smokeless Tobacco Never Used    Allergies  Allergen Reactions  . Amoxicillin Rash  . Percocet [Oxycodone-Acetaminophen] Nausea And Vomiting and Rash   Objective:  There were no vitals filed for this visit. There is no height or weight on file to calculate BMI. Constitutional Well developed. Well nourished.  Vascular Dorsalis  pedis pulses palpable bilaterally. Posterior tibial pulses palpable bilaterally. Capillary refill normal to all digits.  No cyanosis or clubbing noted. Pedal hair growth normal.  Neurologic Normal speech. Oriented to person, place, and time. Epicritic sensation to light touch grossly present bilaterally.  Dermatologic Nails well groomed and normal in appearance. No open wounds. No skin lesions.  Orthopedic:  Moderate pain is now localized more to the left lateral ankle mild pain with dorsiflexion plantarflexion resisted.  Moderate pain to the midfoot.  There is still edema present nonpitting to the left lower extremity.  Patient has a semiflexible pes cavus foot structure.  No hammertoe contractures noted.  Tight Achilles noted.   Radiographs: No fracture or malalignment. Mild chronic sclerosis base of fifth proximal phalanx. The joint spaces appear within normal limits. There is no evidence of fracture, dislocation, or joint effusion. There is no evidence of arthropathy or other focal bone abnormality. Soft tissues are unremarkable. Assessment:   1. Moderate ankle sprain, left, initial encounter   2. Ligament tear    Plan:  Patient was evaluated and treated and all questions answered.  Left lateral ankle sprain/ATFL ligament with underlying capsulitis -Clinically improved with either given a steroid injection or has been off of the foot.  However when she returns back to work or is on her foot it starts hurting continuously.  Given that there has not been any improvement in  this I believe patient will benefit from an MRI evaluation of the left ankle to rule out ligament tear versus osteochondral lesion.  Patient agrees with the plan would like to proceed with an MRI.  We will hold off on the steroid injection -MRI was ordered   Pes cavus -I explained to the patient the etiology of pes cavus foot structure and various treatment options were discussed.  Given that she is having  generalized pain to the lower extremity likely due to no support in the shoes I believe she will benefit from orthotics.  -Orthotics were dispensed and are functioning well ligament tear  No follow-ups on file.

## 2021-05-02 ENCOUNTER — Ambulatory Visit: Payer: BC Managed Care – PPO | Admitting: Podiatry

## 2021-05-24 ENCOUNTER — Other Ambulatory Visit: Payer: Self-pay | Admitting: Obstetrics and Gynecology

## 2021-05-24 DIAGNOSIS — Z3041 Encounter for surveillance of contraceptive pills: Secondary | ICD-10-CM

## 2021-05-24 DIAGNOSIS — Z0000 Encounter for general adult medical examination without abnormal findings: Secondary | ICD-10-CM

## 2021-05-30 ENCOUNTER — Ambulatory Visit: Payer: BC Managed Care – PPO | Admitting: Podiatry

## 2021-06-13 ENCOUNTER — Ambulatory Visit (INDEPENDENT_AMBULATORY_CARE_PROVIDER_SITE_OTHER): Payer: Self-pay | Admitting: Nurse Practitioner

## 2021-06-25 ENCOUNTER — Encounter: Payer: Self-pay | Admitting: Obstetrics and Gynecology

## 2021-07-10 ENCOUNTER — Encounter: Payer: Self-pay | Admitting: Obstetrics and Gynecology

## 2021-07-10 ENCOUNTER — Other Ambulatory Visit: Payer: Self-pay

## 2021-07-10 ENCOUNTER — Ambulatory Visit (INDEPENDENT_AMBULATORY_CARE_PROVIDER_SITE_OTHER): Payer: 59 | Admitting: Obstetrics and Gynecology

## 2021-07-10 VITALS — BP 119/82 | HR 91 | Ht 61.0 in | Wt 208.7 lb

## 2021-07-10 DIAGNOSIS — D6800 Von Willebrand disease, unspecified: Secondary | ICD-10-CM

## 2021-07-10 DIAGNOSIS — Z01419 Encounter for gynecological examination (general) (routine) without abnormal findings: Secondary | ICD-10-CM | POA: Diagnosis not present

## 2021-07-10 DIAGNOSIS — N63 Unspecified lump in unspecified breast: Secondary | ICD-10-CM | POA: Diagnosis not present

## 2021-07-10 DIAGNOSIS — N852 Hypertrophy of uterus: Secondary | ICD-10-CM | POA: Diagnosis not present

## 2021-07-10 DIAGNOSIS — N921 Excessive and frequent menstruation with irregular cycle: Secondary | ICD-10-CM

## 2021-07-10 MED ORDER — LEVONORGEST-ETH ESTRAD 91-DAY 0.15-0.03 &0.01 MG PO TABS
1.0000 | ORAL_TABLET | Freq: Every day | ORAL | 1 refills | Status: DC
Start: 2021-07-10 — End: 2022-01-06

## 2021-07-10 NOTE — Progress Notes (Signed)
HPI:      [name] is a 30 y.o. G1P0010 who LMP was Patient's last menstrual period was 05/13/2021.  Subjective:   She presents today for her annual examination.  Stating that her bleeding is improved on 54-month OCPs but she still has some spotting during the active pills.  She says she gets about 2 weeks/month of no bleeding.  Nevertheless, she would like to continue the 43-month OCPs.  She states that she is taking them correctly/daily.    Hx: The following portions of the patient's history were reviewed and updated as appropriate:             She  has a past medical history of H/O bilateral breast reduction surgery, Lymphadenopathy (2009), Lymphedema of left leg, Ovarian cyst, S/P tonsillectomy, and Suicidal ideation (03/06/2019). She does not have any pertinent problems on file. She  has a past surgical history that includes Tonsillectomy; Breast reduction surgery; Ovarian cyst removal; and LOWER EXTREMITY VENOGRAPHY (Left, 09/24/2020). Her family history includes Diabetes in her mother; Hypertension in her father. She  reports that she has never smoked. She has never used smokeless tobacco. She reports current alcohol use. She reports that she does not use drugs. She has a current medication list which includes the following prescription(s): kariva, levonorgestrel-ethinyl estradiol, mirtazapine, zolpidem, and meclizine. She is allergic to amoxicillin and percocet [oxycodone-acetaminophen].       Review of Systems:  Review of Systems  Constitutional: Denied constitutional symptoms, night sweats, recent illness, fatigue, fever, insomnia and weight loss.  Eyes: Denied eye symptoms, eye pain, photophobia, vision change and visual disturbance.  Ears/Nose/Throat/Neck: Denied ear, nose, throat or neck symptoms, hearing loss, nasal discharge, sinus congestion and sore throat.  Cardiovascular: Denied cardiovascular symptoms, arrhythmia, chest pain/pressure, edema, exercise intolerance,  orthopnea and palpitations.  Respiratory: Denied pulmonary symptoms, asthma, pleuritic pain, productive sputum, cough, dyspnea and wheezing.  Gastrointestinal: Denied, gastro-esophageal reflux, melena, nausea and vomiting.  Genitourinary: Breakthrough bleeding on OCPs  Musculoskeletal: Denied musculoskeletal symptoms, stiffness, swelling, muscle weakness and myalgia.  Dermatologic: Denied dermatology symptoms, rash and scar.  Neurologic: Denied neurology symptoms, dizziness, headache, neck pain and syncope.  Psychiatric: Denied psychiatric symptoms, anxiety and depression.  Endocrine: Denied endocrine symptoms including hot flashes and night sweats.   Meds:   Current Outpatient Medications on File Prior to Visit  Medication Sig Dispense Refill   KARIVA 0.15-0.02/0.01 MG (21/5) tablet TAKE 1 TABLET BY MOUTH EVERYDAY AT BEDTIME 84 tablet 0   mirtazapine (REMERON) 30 MG tablet Take 1 tablet (30 mg total) by mouth at bedtime. 30 tablet 3   zolpidem (AMBIEN) 5 MG tablet Take 5 mg by mouth at bedtime as needed.     meclizine (ANTIVERT) 12.5 MG tablet Take 1 tablet (12.5 mg total) by mouth at bedtime. (Patient not taking: Reported on 07/10/2021) 30 tablet 0   No current facility-administered medications on file prior to visit.       Upstream - 07/10/21 0813       Pregnancy Intention Screening   Does the patient want to become pregnant in the next year? No    Does the patient's partner want to become pregnant in the next year? No    Would the patient like to discuss contraceptive options today? No      Contraception Wrap Up   Current Method Oral Contraceptive    End Method Oral Contraceptive    Contraception Counseling Provided No  The pregnancy intention screening data noted above was reviewed. Potential methods of contraception were discussed. The patient elected to proceed with Oral Contraceptive.    Objective:     Vitals:   07/10/21 0810  BP: 119/82  Pulse: 91     Filed Weights   07/10/21 0810  Weight: 208 lb 11.2 oz (94.7 kg)              Physical examination General NAD, Conversant  HEENT Atraumatic; Op clear with mmm.  Normo-cephalic. Pupils reactive. Anicteric sclerae  Thyroid/Neck Smooth without nodularity or enlargement. Normal ROM.  Neck Supple.  Skin No rashes, lesions or ulceration. Normal palpated skin turgor. No nodularity.  Breasts: Bilateral breast reduction surgery right breast reveals ridgelike mass at approximately 5:00, no nipple discharge no axillary adenopathy  Lungs: Clear to auscultation.No rales or wheezes. Normal Respiratory effort, no retractions.  Heart: NSR.  No murmurs or rubs appreciated. No periferal edema  Abdomen: Soft.  Non-tender.  No masses.  No HSM. No hernia  Extremities: Moves all appropriately.  Normal ROM for age. No lymphadenopathy.  Neuro: Oriented to PPT.  Normal mood. Normal affect.     Pelvic:   Vulva: Normal appearance.  No lesions.  Vagina: No lesions or abnormalities noted.  Support: Normal pelvic support.  Urethra No masses tenderness or scarring.  Meatus Normal size without lesions or prolapse.  Cervix: Normal appearance.  No lesions.  Anus: Normal exam.  No lesions.  Perineum: Normal exam.  No lesions.        Bimanual   Uterus: Enlarged-approximately 10 weeks size non-tender.  Mobile.  AV.  Adnexae: No masses.  Non-tender to palpation.  Cul-de-sac: Negative for abnormality.     Assessment:    G1P0010 Patient Active Problem List   Diagnosis Date Noted   Migraine without aura and without status migrainosus, not intractable 01/11/2021   Swelling of limb 11/29/2019   Lymphedema 11/29/2019   Noncompliance 03/06/2019   Class 1 obesity due to excess calories without serious comorbidity with body mass index (BMI) of 30.0 to 30.9 in adult 02/11/2017   History of cervical dysplasia 02/11/2017   Liver lesion, right lobe 10/17/2016   Von Willebrand's disease (HCC) 11/10/2015   PCOS  (polycystic ovarian syndrome) 09/20/2015     1. Well woman exam with routine gynecological exam   2. Breakthrough bleeding on OCPs   3. Breast mass in female   4. Uterine enlargement   5. Von Willebrand's disease [facility])     Breakthrough bleeding possibly secondary to uterine fibroids.  Possibly secondary to von Willebrand's.  Enlarged uterus -question fibroids  Right breast mass-likely scar tissue secondary to breast reduction surgery.   Plan:            1.  Basic Screening Recommendations The basic screening recommendations for asymptomatic women were discussed with the patient during her visit.  The age-appropriate recommendations were discussed with her and the rational for the tests reviewed.  When I am informed by the patient that another primary care physician has previously obtained the age-appropriate tests and they are up-to-date, only outstanding tests are ordered and referrals given as necessary.  Abnormal results of tests will be discussed with her when all of her results are completed.  Routine preventative health maintenance measures emphasized: Exercise/Diet/Weight control, Tobacco Warnings, Alcohol/Substance use risks and Stress Management 2.  Pelvic ultrasound to rule out uterine fibroids 3.  She has elected to continue OCPs 4.  Von Willebrand's and hand breakthrough bleeding discussed  5.  Ultrasound for right breast mass  Orders Orders Placed This Encounter  Procedures   US BREAST LTD UNI RIGHT INC AXILLA   US PELVIS TRANSVAGINAL NON-OB (TV ONLY)   US PELVIS (TRANSABDOMINAL ONLY)     Meds ordered this encounter  Medications   Levonorgestrel-Ethinyl Estradiol (AMETHIA) 0.15-0.03 &0.01 MG tablet    Sig: Take 1 tablet by mouth at bedtime.    Dispense:  84 tablet    Refill:  1          F/U  Return in about 1 year (around 07/10/2022) for Annual Physical, We will contact her with any abnormal test results. I spent 13 additional minutes involved in the care of this  patient specifically addressing her right breast mass enlarged uterus von Willebrand's disease and breakthrough bleeding.  Additionally preparing to see the patient by obtaining and reviewing her medical history (including labs, imaging tests and prior procedures), documenting clinical information in the electronic health record (EHR), counseling and coordinating care plans, writing and sending prescriptions, ordering tests or procedures and directly communicating with the patient by discussing pertinent items from her history and physical exam as well as detailing my assessment and plan as noted above so that she has an informed understanding.  All of her questions were answered.  Elonda Husky, M.D. 07/10/2021 8:46 AM

## 2021-07-11 ENCOUNTER — Encounter: Payer: Self-pay | Admitting: Obstetrics and Gynecology

## 2021-07-12 ENCOUNTER — Other Ambulatory Visit: Payer: Self-pay | Admitting: Obstetrics and Gynecology

## 2021-07-12 DIAGNOSIS — N63 Unspecified lump in unspecified breast: Secondary | ICD-10-CM

## 2021-07-24 ENCOUNTER — Ambulatory Visit
Admission: RE | Admit: 2021-07-24 | Discharge: 2021-07-24 | Disposition: A | Discharge status: Home / Self Care | Payer: 59 | Source: Ambulatory Visit | Attending: Obstetrics and Gynecology | Admitting: Obstetrics and Gynecology

## 2021-07-24 DIAGNOSIS — N852 Hypertrophy of uterus: Secondary | ICD-10-CM

## 2021-08-01 ENCOUNTER — Ambulatory Visit (INDEPENDENT_AMBULATORY_CARE_PROVIDER_SITE_OTHER): Payer: Self-pay | Admitting: Nurse Practitioner

## 2021-08-09 ENCOUNTER — Ambulatory Visit (INDEPENDENT_AMBULATORY_CARE_PROVIDER_SITE_OTHER): Payer: Self-pay | Admitting: Nurse Practitioner

## 2021-08-13 ENCOUNTER — Ambulatory Visit (INDEPENDENT_AMBULATORY_CARE_PROVIDER_SITE_OTHER): Payer: 59 | Admitting: Nurse Practitioner

## 2021-08-13 ENCOUNTER — Encounter (INDEPENDENT_AMBULATORY_CARE_PROVIDER_SITE_OTHER): Payer: Self-pay | Admitting: Nurse Practitioner

## 2021-08-13 ENCOUNTER — Other Ambulatory Visit: Payer: Self-pay

## 2021-08-13 VITALS — BP 130/88 | HR 86 | Resp 18 | Ht 61.0 in | Wt 209.0 lb

## 2021-08-13 DIAGNOSIS — M25572 Pain in left ankle and joints of left foot: Secondary | ICD-10-CM | POA: Diagnosis not present

## 2021-08-13 DIAGNOSIS — I890 Lymphedema, not elsewhere classified: Secondary | ICD-10-CM

## 2021-08-14 ENCOUNTER — Encounter (INDEPENDENT_AMBULATORY_CARE_PROVIDER_SITE_OTHER): Payer: Self-pay | Admitting: Nurse Practitioner

## 2021-08-14 NOTE — Progress Notes (Signed)
Subjective:    Patient ID: Mallory Johnston, female    DOB: 07/29/1991, 30 y.o.   MRN: [id number] Chief Complaint  Patient presents with  . Follow-up    LLE still continues to swell    Mallory Johnston is a 30 year old female that presents today for follow-up evaluation of her lymphedema.  Today the patient notes that she is very diligent with wearing medical grade compression stockings daily.  The lymphedema is much more improved than when she first began with this but she still has issues with swelling.  She continues to have persistent pain in her left ankle as well.  She has been working with podiatry for treatment.  She previously had a cortisone shot which was somewhat helpful but she continues to have pain.  It was recently recommended that she has an MRI but due to some job changes she has not been able to get one as of yet.  The patient also continues to utilize other conservative tactics of elevation as possible.  Due to the ankle pain activity is difficult.  Review of Systems  Cardiovascular:  Positive for leg swelling.  Musculoskeletal:  Positive for arthralgias.  All other systems reviewed and are negative.     Objective:   Physical Exam Vitals reviewed.  HENT:     Head: Normocephalic.  Cardiovascular:     Rate and Rhythm: Normal rate.  Pulmonary:     Effort: Pulmonary effort is normal.  Musculoskeletal:     Left lower leg: Edema present.  Skin:    General: Skin is warm and dry.  Neurological:     Mental Status: She is alert and oriented to person, place, and time.  Psychiatric:        Mood and Affect: Mood normal.        Behavior: Behavior normal.        Thought Content: Thought content normal.        Judgment: Judgment normal.    BP 130/88 (BP Location: Right Arm, Cuff Size: Large)   Pulse 86   Resp 18   Ht 5\' 1"  (1.549 m)   Wt 209 lb (94.8 kg)   BMI 39.49 kg/m   Past Medical History:  Diagnosis Date  . H/O bilateral breast reduction surgery   .  Lymphadenopathy 2009   left leg  . Lymphedema of left leg   . Ovarian cyst   . S/P tonsillectomy   . Suicidal ideation 03/06/2019    Social History   Socioeconomic History  . Marital status: Single    Spouse name: Not on file  . Number of children: Not on file  . Years of education: Not on file  . Highest education level: Not on file  Occupational History  . Not on file  Tobacco Use  . Smoking status: Never  . Smokeless tobacco: Never  Vaping Use  . Vaping Use: Never used  Substance and Sexual Activity  . Alcohol use: Yes    Comment: rare  . Drug use: No  . Sexual activity: Yes    Birth control/protection: Pill, Condom  Other Topics Concern  . Not on file  Social History Narrative  . Not on file   Social Determinants of Health   Financial Resource Strain: Not on file  Food Insecurity: Not on file  Transportation Needs: Not on file  Physical Activity: Not on file  Stress: Not on file  Social Connections: Not on file  Intimate Partner Violence: Not on file  Past Surgical History:  Procedure Laterality Date  . BREAST REDUCTION SURGERY    . LOWER EXTREMITY VENOGRAPHY Left 09/24/2020   Procedure: LOWER EXTREMITY VENOGRAPHY;  Surgeon: Annice Needy, MD;  Location: [facility] INVASIVE CV [facility];  Service: Cardiovascular;  Laterality: Left;  . OVARIAN CYST REMOVAL    . TONSILLECTOMY      Family History  Problem Relation Age of Onset  . Hypertension Father   . Diabetes Mother   . Breast cancer Neg Hx   . Ovarian cancer Neg Hx   . Colon cancer Neg Hx     Allergies  Allergen Reactions  . Amoxicillin Rash  . Percocet [Oxycodone-Acetaminophen] Nausea And Vomiting and Rash    CBC Latest Ref Rng & Units 03/05/2019 01/20/2019 05/19/2014  WBC 4.0 - 10.5 K/uL 10.5 11.0(H) 13.0(H)  Hemoglobin 12.0 - 15.0 g/dL 67.6 19.5 09.3  Hematocrit 36.0 - 46.0 % 39.9 40.5 41.7  Platelets 150 - 400 K/uL 336 273 277      CMP     Component Value Date/Time   NA 139 03/05/2019 1935   NA  137 05/19/2014 0206   K 3.4 (L) 03/05/2019 1935   K 4.0 05/19/2014 0206   CL 106 03/05/2019 1935   CL 103 05/19/2014 0206   CO2 25 03/05/2019 1935   CO2 28 05/19/2014 0206   GLUCOSE 96 03/05/2019 1935   GLUCOSE 93 05/19/2014 0206   BUN 9 09/24/2020 0759   BUN 10 05/19/2014 0206   CREATININE 0.74 09/24/2020 0759   CREATININE 0.88 05/19/2014 0206   CALCIUM 9.2 03/05/2019 1935   CALCIUM 9.2 05/19/2014 0206   PROT 9.0 (H) 03/05/2019 1935   PROT 8.8 (H) 03/04/2013 1510   ALBUMIN 4.5 03/05/2019 1935   ALBUMIN 3.9 03/04/2013 1510   AST 21 03/05/2019 1935   AST 76 (H) 03/04/2013 1510   ALT 15 03/05/2019 1935   ALT 97 (H) 03/04/2013 1510   ALKPHOS 65 03/05/2019 1935   ALKPHOS 107 03/04/2013 1510   BILITOT 0.5 03/05/2019 1935   BILITOT 0.3 03/04/2013 1510   GFRNONAA >60 09/24/2020 0759   GFRNONAA >60 05/19/2014 0206   GFRAA >60 09/24/2020 0759   GFRAA >60 05/19/2014 0206     No results found.     Assessment & Plan:   1. Lymphedema   Recommend:  No surgery or intervention at this point in time.    I have reviewed my previous discussion with the patient regarding swelling and why it causes symptoms.  Patient will continue wearing graduated compression stockings class 1 (20-30 mmHg) on a daily basis. The patient will  beginning wearing the stockings first thing in the morning and removing them in the evening. The patient is instructed specifically not to sleep in the stockings.    In addition, behavioral modification including several periods of elevation of the lower extremities during the day will be continued.  This was reviewed with the patient during the initial visit.  The patient will also continue routine exercise, especially walking on a daily basis as was discussed during the initial visit.    Despite conservative treatments of well over 4 weeks, including graduated compression therapy class 1 and behavioral modification including exercise and elevation the patient   has not obtained adequate control of the lymphedema.  The patient still has stage 3 lymphedema and therefore, I believe that a lymph pump should be added to improve the control of the patient's lymphedema.  Additionally, a lymph pump is warranted because it  will reduce the risk of cellulitis and ulceration in the future.  Patient should follow-up in six months    2. Pain of joint of left ankle and foot The patient has been working with podiatry to treat the left lateral ankle pain.  Despite cortisone shots she still continues to have pain and issues with ambulating.  Podiatry previously notes an MRI is recommended to rule out ligament tear versus osteochondral lesion.  Patient will continue to follow with podiatry for ankle pain and issues.   Current Outpatient Medications on File Prior to Visit  Medication Sig Dispense Refill  . Levonorgestrel-Ethinyl Estradiol (AMETHIA) 0.15-0.03 &0.01 MG tablet Take 1 tablet by mouth at bedtime. 84 tablet 1  . meclizine (ANTIVERT) 12.5 MG tablet Take 1 tablet (12.5 mg total) by mouth at bedtime. 30 tablet 0  . mirtazapine (REMERON) 30 MG tablet Take 1 tablet (30 mg total) by mouth at bedtime. 30 tablet 3  . zolpidem (AMBIEN) 5 MG tablet Take 5 mg by mouth at bedtime as needed.    Marland Kitchen KARIVA 0.15-0.02/0.01 MG (21/5) tablet TAKE 1 TABLET BY MOUTH EVERYDAY AT BEDTIME (Patient not taking: Reported on 08/13/2021) 84 tablet 0   No current facility-administered medications on file prior to visit.    There are no Patient Instructions on file for this visit. No follow-ups on file.   Georgiana Spinner, NP

## 2021-08-16 ENCOUNTER — Other Ambulatory Visit: Payer: Self-pay | Admitting: Obstetrics and Gynecology

## 2021-08-16 ENCOUNTER — Other Ambulatory Visit: Payer: Self-pay

## 2021-08-16 ENCOUNTER — Ambulatory Visit
Admission: RE | Admit: 2021-08-16 | Discharge: 2021-08-16 | Disposition: A | Discharge status: Home / Self Care | Payer: 59 | Source: Ambulatory Visit | Attending: Obstetrics and Gynecology | Admitting: Obstetrics and Gynecology

## 2021-08-16 DIAGNOSIS — N63 Unspecified lump in unspecified breast: Secondary | ICD-10-CM

## 2021-08-30 ENCOUNTER — Inpatient Hospital Stay: Admission: RE | Admit: 2021-08-30 | Payer: 59 | Source: Ambulatory Visit

## 2021-09-07 ENCOUNTER — Ambulatory Visit
Admission: RE | Admit: 2021-09-07 | Discharge: 2021-09-07 | Disposition: A | Discharge status: Home / Self Care | Payer: 59 | Source: Ambulatory Visit | Attending: Podiatry | Admitting: Podiatry

## 2021-09-07 ENCOUNTER — Other Ambulatory Visit: Payer: Self-pay

## 2021-09-07 DIAGNOSIS — T148XXA Other injury of unspecified body region, initial encounter: Secondary | ICD-10-CM

## 2021-09-09 IMAGING — CR DG FOOT COMPLETE 3+V*L*
1 series · 3 of 3 positions shown · non-contrast
Comparison: 05/07/2014

CLINICAL DATA: Foot and ankle pain

EXAM:
LEFT FOOT - COMPLETE 3+ VIEW

[Series 1: dg foot complete left · 0.14mm/px · 3 of 3 slices shown]
[im 1/3]
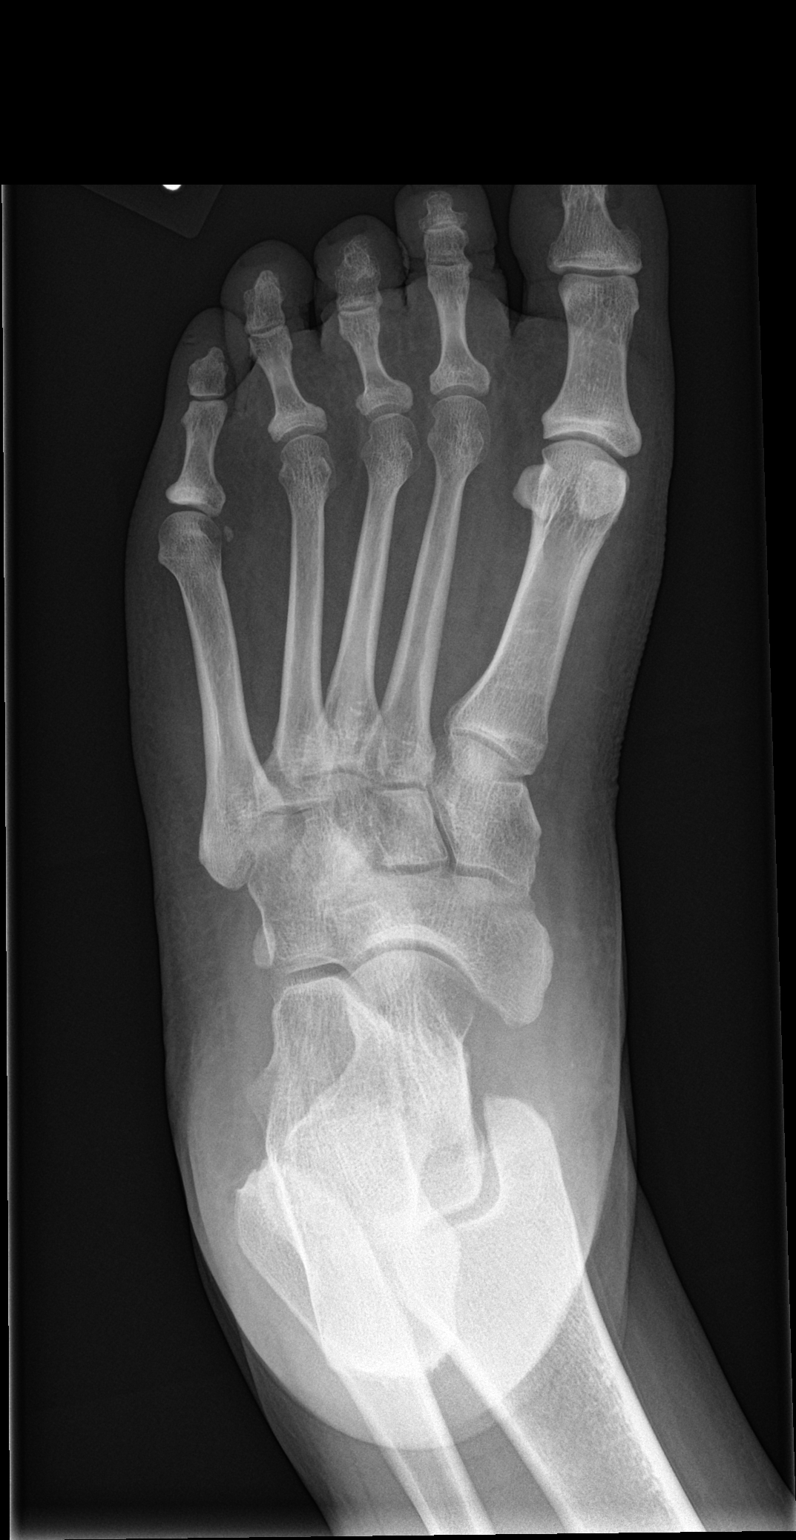
[im 2/3]
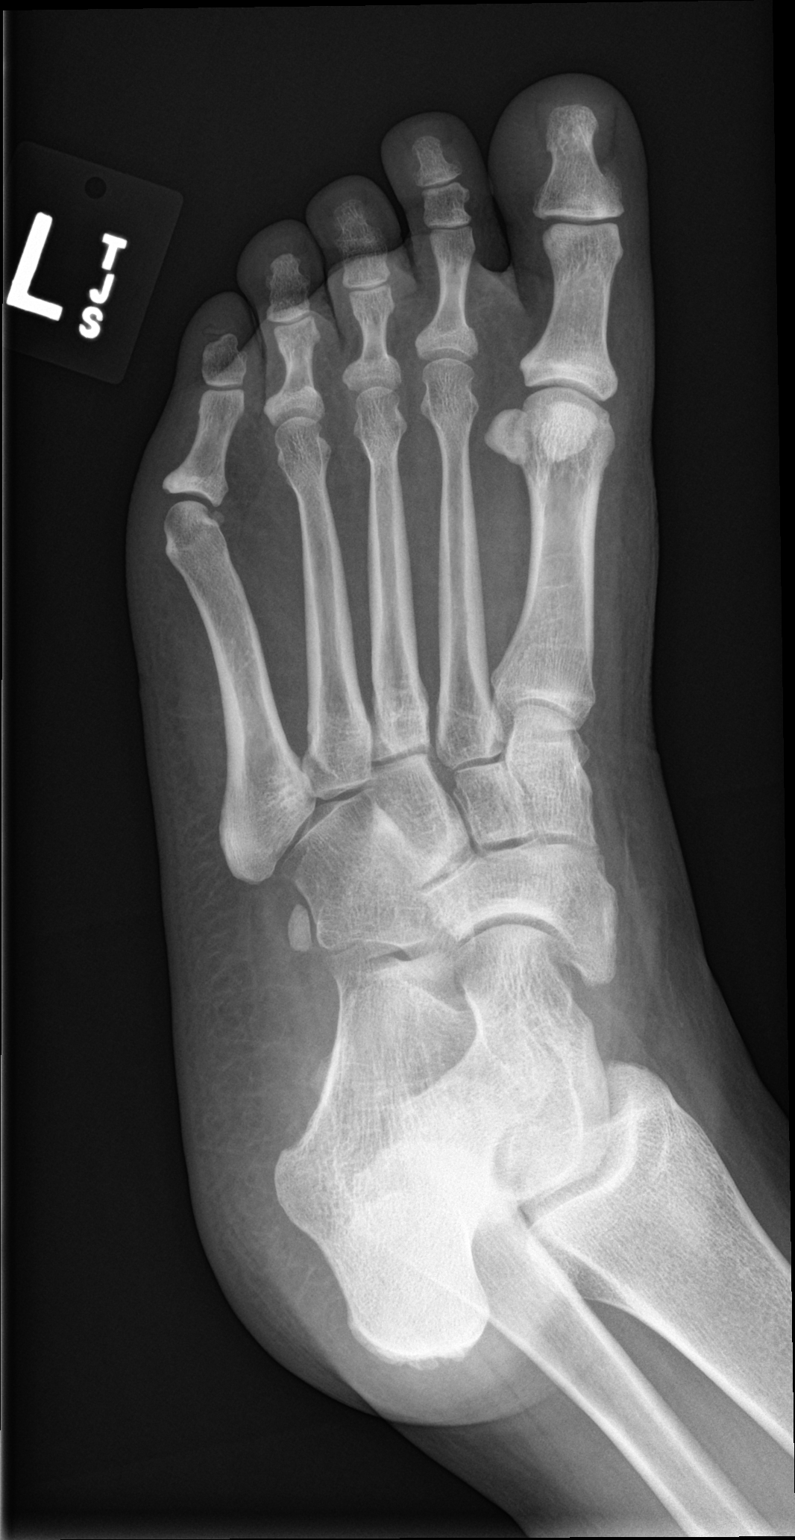
[im 3/3]
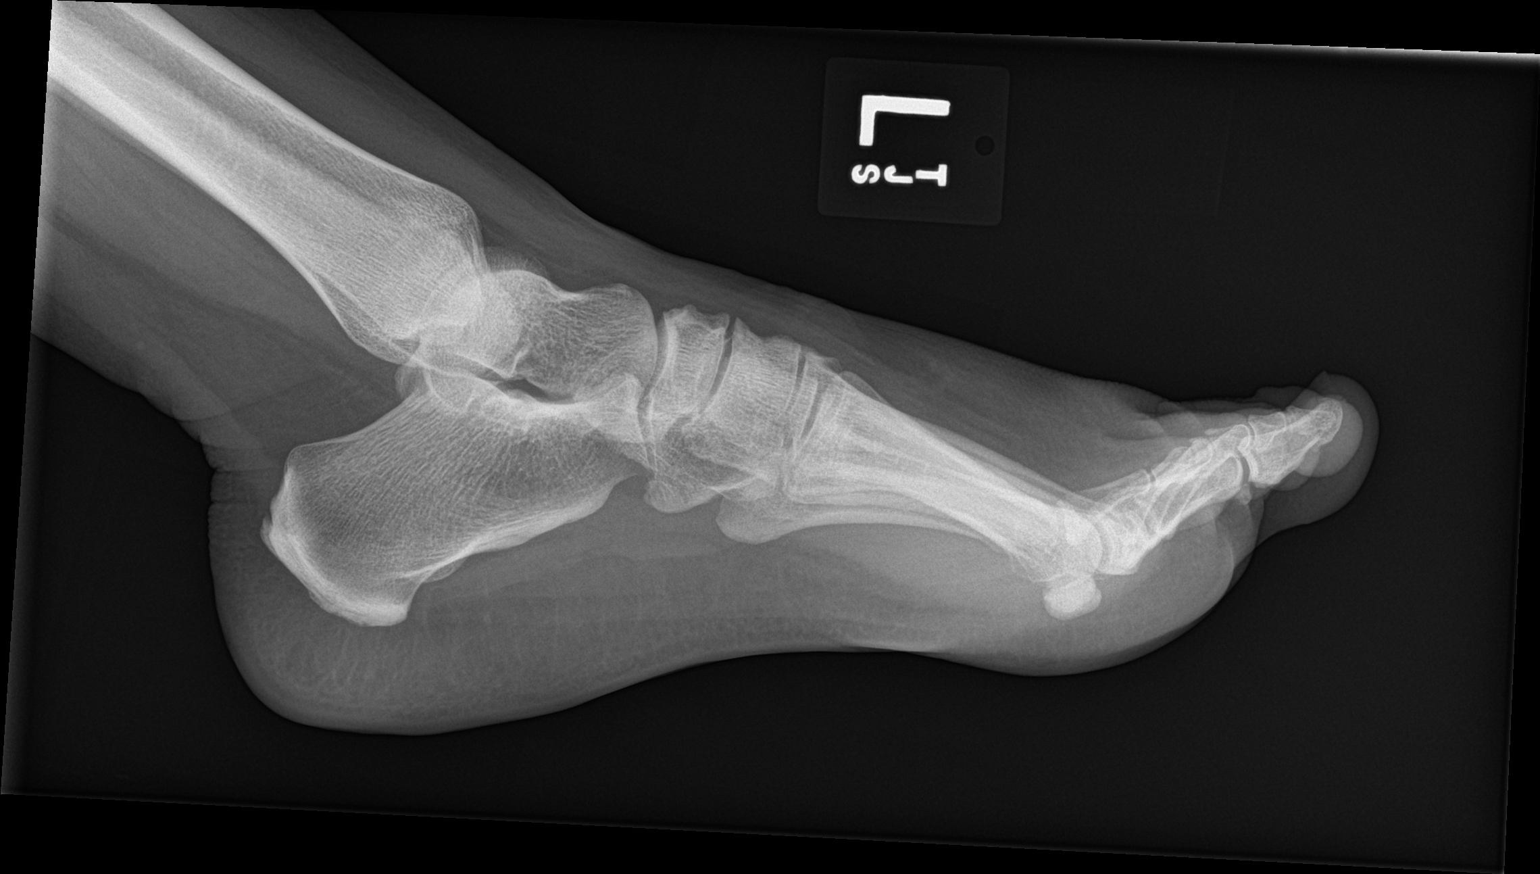

[3 of 3 positions shown; findings below may reference images not displayed]

FINDINGS: No fracture or malalignment. Mild chronic sclerosis base of fifth
proximal phalanx. The joint spaces appear within normal limits.
IMPRESSION: Negative.

## 2021-09-09 IMAGING — CR DG ANKLE COMPLETE 3+V*L*
1 series · 3 of 3 positions shown · non-contrast
Comparison: 05/07/2014

CLINICAL DATA: Foot and ankle pain

EXAM:
LEFT ANKLE COMPLETE - 3+ VIEW

[Series 1: dg ankle complete left · 0.14mm/px · 3 of 3 slices shown]
[im 1/3]
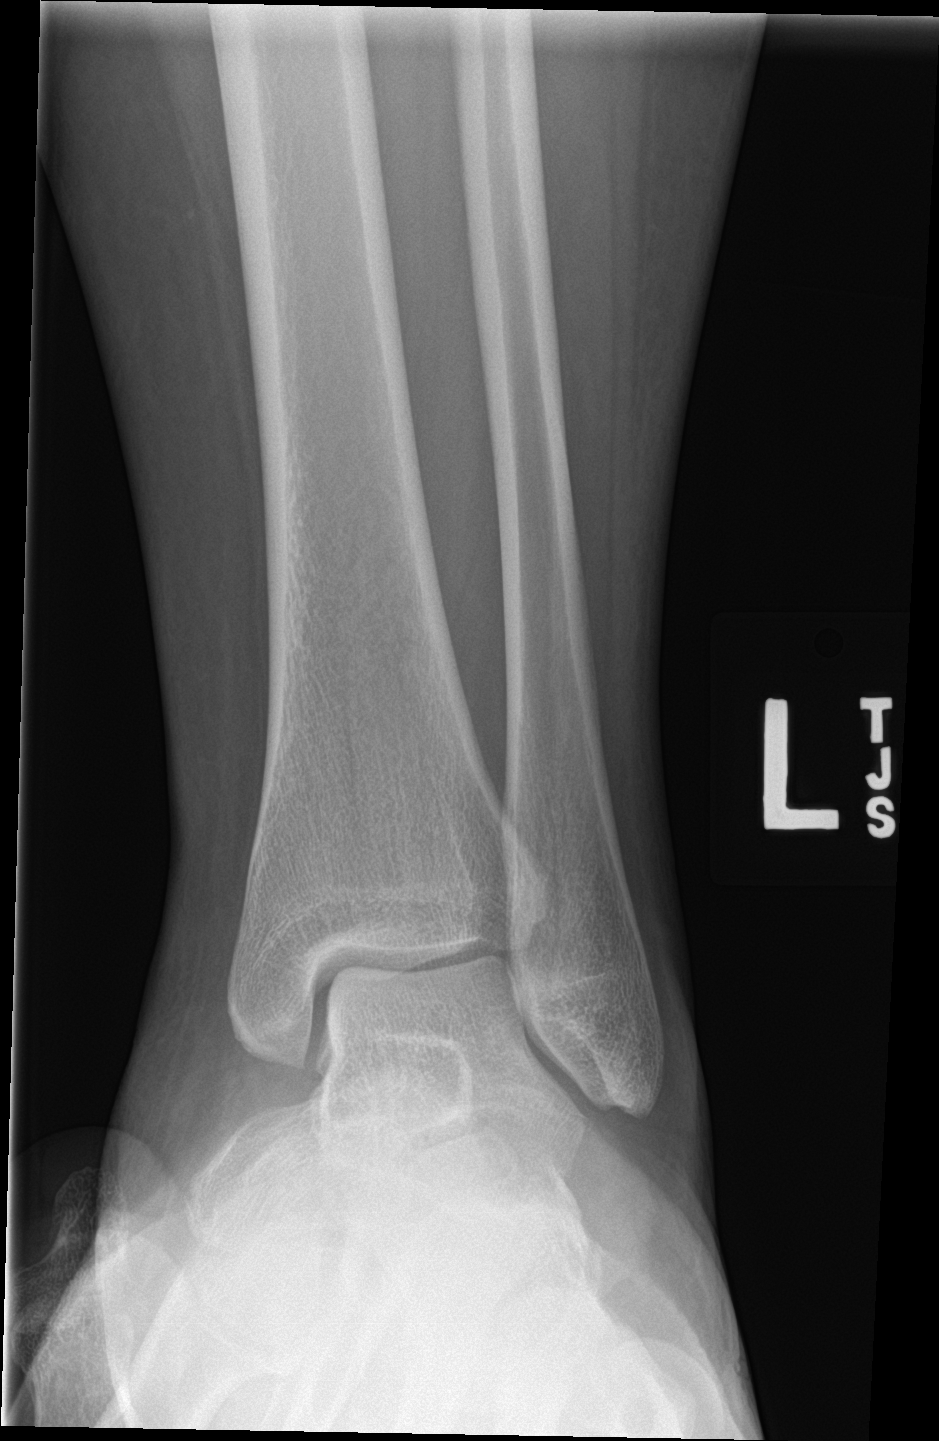
[im 2/3]
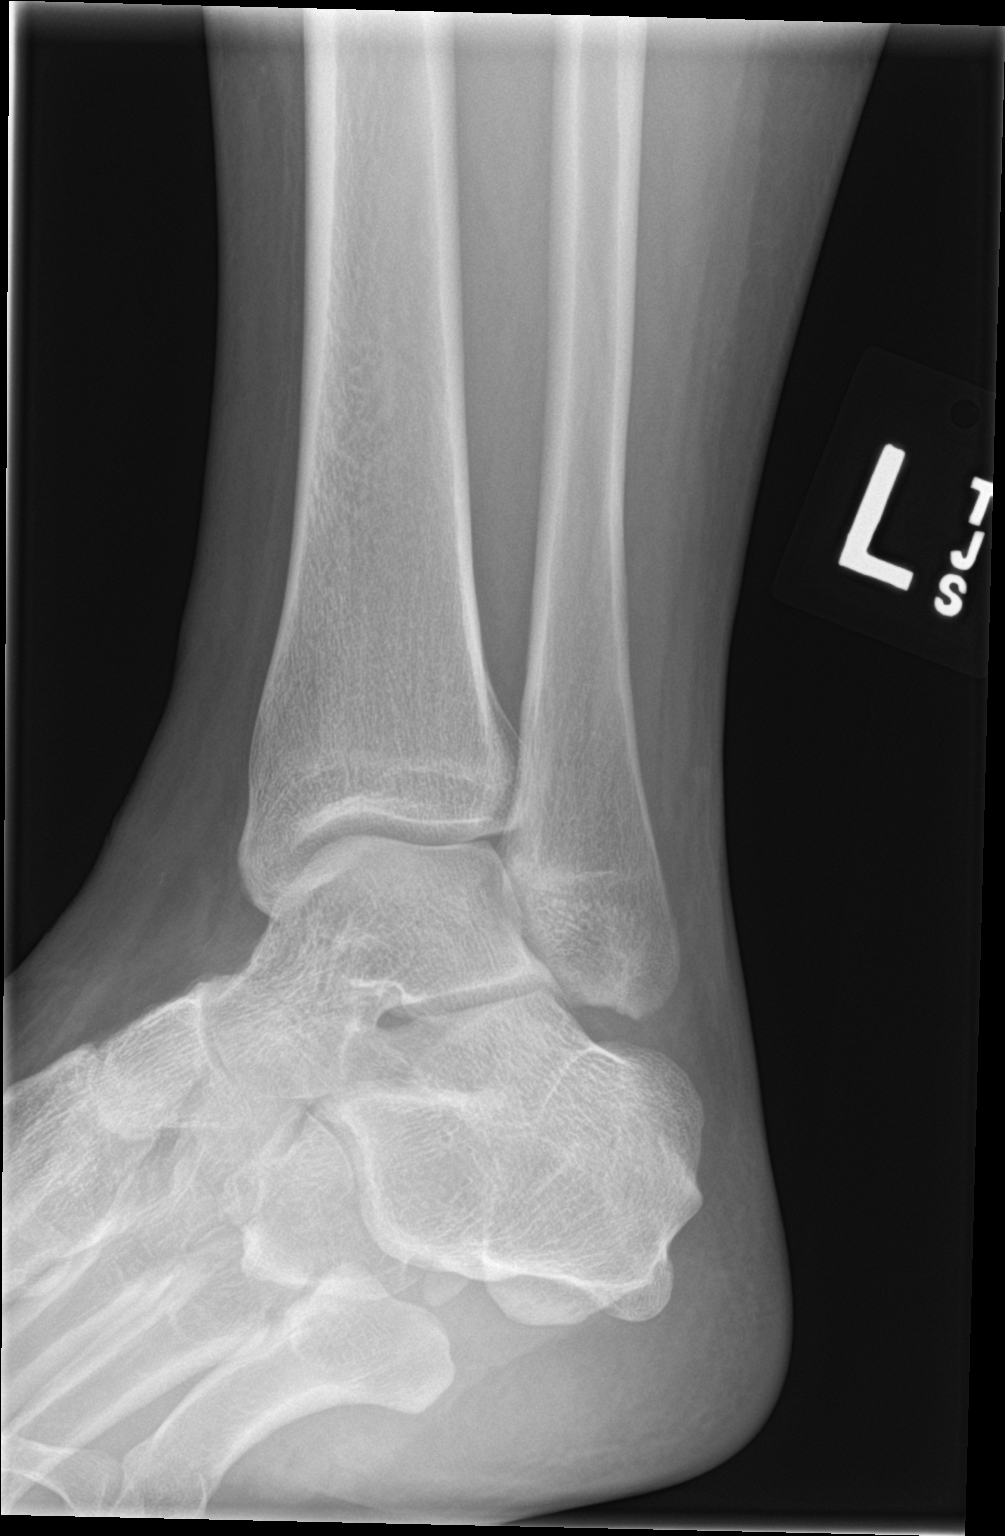
[im 3/3]
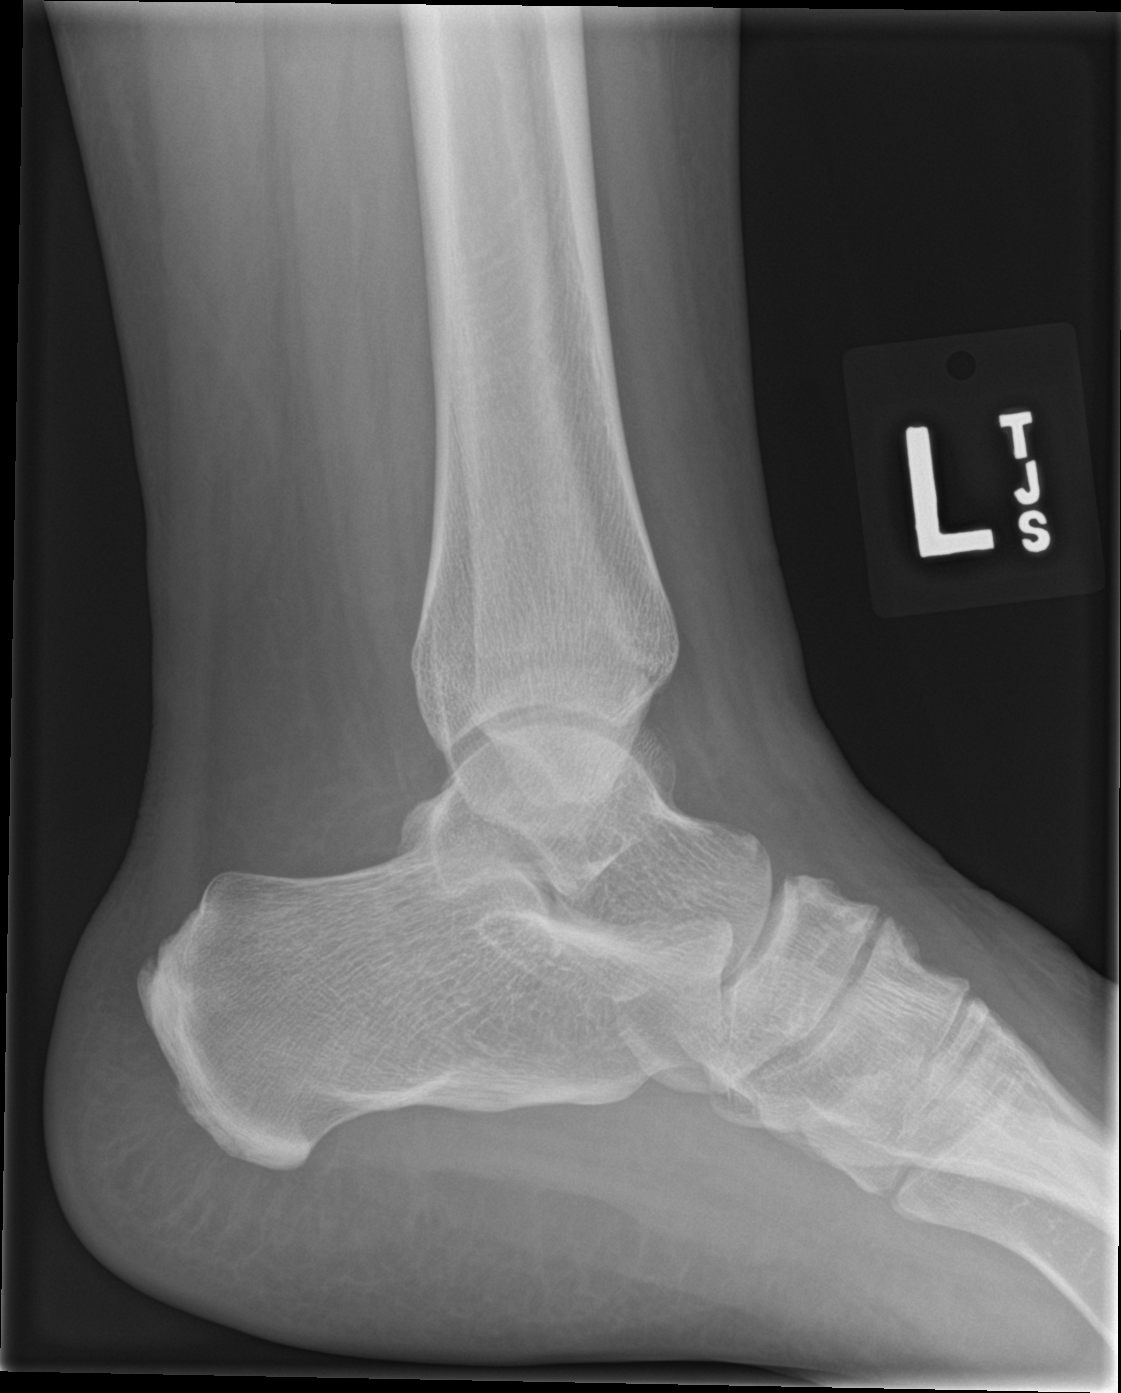

[3 of 3 positions shown; findings below may reference images not displayed]

FINDINGS: There is no evidence of fracture, dislocation, or joint effusion.
There is no evidence of arthropathy or other focal bone abnormality.
Soft tissues are unremarkable.
IMPRESSION: Negative.

## 2021-09-12 ENCOUNTER — Ambulatory Visit (INDEPENDENT_AMBULATORY_CARE_PROVIDER_SITE_OTHER): Payer: 59 | Admitting: Podiatry

## 2021-09-12 ENCOUNTER — Encounter: Payer: Self-pay | Admitting: Podiatry

## 2021-09-12 ENCOUNTER — Other Ambulatory Visit: Payer: Self-pay

## 2021-09-12 DIAGNOSIS — M7752 Other enthesopathy of left foot: Secondary | ICD-10-CM

## 2021-09-12 DIAGNOSIS — S93402A Sprain of unspecified ligament of left ankle, initial encounter: Secondary | ICD-10-CM | POA: Diagnosis not present

## 2021-09-12 NOTE — Progress Notes (Signed)
Subjective:  Patient ID: Mallory Johnston, female    DOB: 05-Dec-1991,  MRN: [id number]  Chief Complaint  Patient presents with   Foot Pain    MRI results     30 y.o. female presents with the above complaint.  Patient presents with continuous left lateral ankle pain.  Patient states injection does help however she would like to continue holding off of it.  She states that is feeling a little bit better while she was on vacation however she notices still good amount of pain when she is ambulating.  The orthotics are functioning well.   Review of Systems: Negative except as noted in the HPI. Denies N/V/F/Ch.  Past Medical History:  Diagnosis Date   H/O bilateral breast reduction surgery    Lymphadenopathy 2009   left leg   Lymphedema of left leg    Ovarian cyst    S/P tonsillectomy    Suicidal ideation 03/06/2019    Current Outpatient Medications:    KARIVA 0.15-0.02/0.01 MG (21/5) tablet, TAKE 1 TABLET BY MOUTH EVERYDAY AT BEDTIME (Patient not taking: Reported on 08/13/2021), Disp: 84 tablet, Rfl: 0   Levonorgestrel-Ethinyl Estradiol (AMETHIA) 0.15-0.03 &0.01 MG tablet, Take 1 tablet by mouth at bedtime., Disp: 84 tablet, Rfl: 1   meclizine (ANTIVERT) 12.5 MG tablet, Take 1 tablet (12.5 mg total) by mouth at bedtime., Disp: 30 tablet, Rfl: 0   mirtazapine (REMERON) 30 MG tablet, Take 1 tablet (30 mg total) by mouth at bedtime., Disp: 30 tablet, Rfl: 3   zolpidem (AMBIEN) 5 MG tablet, Take 5 mg by mouth at bedtime as needed., Disp: , Rfl:   Social History   Tobacco Use  Smoking Status Never  Smokeless Tobacco Never    Allergies  Allergen Reactions   Amoxicillin Rash   Percocet [Oxycodone-Acetaminophen] Nausea And Vomiting and Rash   Objective:  There were no vitals filed for this visit. There is no height or weight on file to calculate BMI. Constitutional Well developed. Well nourished.  Vascular Dorsalis pedis pulses palpable bilaterally. Posterior tibial pulses palpable  bilaterally. Capillary refill normal to all digits.  No cyanosis or clubbing noted. Pedal hair growth normal.  Neurologic Normal speech. Oriented to person, place, and time. Epicritic sensation to light touch grossly present bilaterally.  Dermatologic Nails well groomed and normal in appearance. No open wounds. No skin lesions.  Orthopedic:  Moderate pain is now localized more to the left lateral ankle mild pain with dorsiflexion plantarflexion resisted.  Moderate pain to the midfoot.  There is still edema present nonpitting to the left lower extremity.  Patient has a semiflexible pes cavus foot structure.  No hammertoe contractures noted.  Tight Achilles noted.   Radiographs: No fracture or malalignment. Mild chronic sclerosis base of fifth proximal phalanx. The joint spaces appear within normal limits. There is no evidence of fracture, dislocation, or joint effusion. There is no evidence of arthropathy or other focal bone abnormality. Soft tissues are unremarkable.  1. Apparent lateral midfoot degenerative changes which could be posttraumatic in etiology. No subluxation at the Lisfranc joint identified. The Lisfranc ligament appears intact. 2. No talar dome osteochondral lesion, tibiotalar or hindfoot arthropathy identified. 3. The ankle tendons and ligaments appear intact. Assessment:   1. Moderate ankle sprain, left, initial encounter   2. Capsulitis of left ankle     Plan:  Patient was evaluated and treated and all questions answered.  Left lateral ankle sprain/ATFL ligament with underlying capsulitis -Given that she still continues to have pain  I believe she will benefit from another steroid injection of decrease acute inflammatory component associate with pain.  She is unable to transition out of the boot into regular shoes.  Patient agrees plan would like to try another steroid injection -A steroid injection was performed at right ankle joint using 1% plain Lidocaine and  10 mg of Kenalog. This was well tolerated. -MRI was reviewed with the patient which did not show any kind of ankle involvement.  At this time I discussed that -She will also start physical therapy to help with mobilization and tiredness.  Patient agrees on plan to proceed with physical therapy for strengthening of the foot  Pes cavus -I explained to the patient the etiology of pes cavus foot structure and various treatment options were discussed.  Given that she is having generalized pain to the lower extremity likely due to no support in the shoes I believe she will benefit from orthotics.  -Orthotics were dispensed and are functioning well ligament tear  No follow-ups on file.

## 2021-10-10 ENCOUNTER — Ambulatory Visit: Payer: 59 | Admitting: Podiatry

## 2021-10-22 ENCOUNTER — Encounter: Payer: Self-pay | Admitting: Podiatry

## 2021-10-22 ENCOUNTER — Ambulatory Visit (INDEPENDENT_AMBULATORY_CARE_PROVIDER_SITE_OTHER): Payer: 59 | Admitting: Podiatry

## 2021-10-22 ENCOUNTER — Other Ambulatory Visit: Payer: Self-pay

## 2021-10-22 DIAGNOSIS — S93402A Sprain of unspecified ligament of left ankle, initial encounter: Secondary | ICD-10-CM

## 2021-10-22 DIAGNOSIS — M7752 Other enthesopathy of left foot: Secondary | ICD-10-CM | POA: Diagnosis not present

## 2021-10-22 NOTE — Progress Notes (Signed)
Subjective:  Patient ID: Mallory Johnston, female    DOB: November 06, 1991,  MRN: [id number]  Chief Complaint  Patient presents with   Foot Pain    "It has it's days."    30 y.o. female presents with the above complaint.  Patient presents with continuous left lateral ankle pain.  She states her pain is now occasional.  The injection helped considerably.  She was not able to go to physical therapy due to family emergency.  She denies any other acute complaints.   Review of Systems: Negative except as noted in the HPI. Denies N/V/F/Ch.  Past Medical History:  Diagnosis Date   H/O bilateral breast reduction surgery    Lymphadenopathy 2009   left leg   Lymphedema of left leg    Ovarian cyst    S/P tonsillectomy    Suicidal ideation 03/06/2019    Current Outpatient Medications:    KARIVA 0.15-0.02/0.01 MG (21/5) tablet, TAKE 1 TABLET BY MOUTH EVERYDAY AT BEDTIME, Disp: 84 tablet, Rfl: 0   Levonorgestrel-Ethinyl Estradiol (AMETHIA) 0.15-0.03 &0.01 MG tablet, Take 1 tablet by mouth at bedtime., Disp: 84 tablet, Rfl: 1   meclizine (ANTIVERT) 12.5 MG tablet, Take 1 tablet (12.5 mg total) by mouth at bedtime., Disp: 30 tablet, Rfl: 0   mirtazapine (REMERON) 30 MG tablet, Take 1 tablet (30 mg total) by mouth at bedtime., Disp: 30 tablet, Rfl: 3   zolpidem (AMBIEN) 5 MG tablet, Take 5 mg by mouth at bedtime as needed., Disp: , Rfl:   Social History   Tobacco Use  Smoking Status Never  Smokeless Tobacco Never    Allergies  Allergen Reactions   Amoxicillin Rash   Percocet [Oxycodone-Acetaminophen] Nausea And Vomiting and Rash   Objective:  There were no vitals filed for this visit. There is no height or weight on file to calculate BMI. Constitutional Well developed. Well nourished.  Vascular Dorsalis pedis pulses palpable bilaterally. Posterior tibial pulses palpable bilaterally. Capillary refill normal to all digits.  No cyanosis or clubbing noted. Pedal hair growth normal.   Neurologic Normal speech. Oriented to person, place, and time. Epicritic sensation to light touch grossly present bilaterally.  Dermatologic Nails well groomed and normal in appearance. No open wounds. No skin lesions.  Orthopedic:  Moderate pain is now localized more to the left lateral ankle mild pain with dorsiflexion plantarflexion resisted.  Moderate pain to the midfoot.  There is still edema present nonpitting to the left lower extremity.  Patient has a semiflexible pes cavus foot structure.  No hammertoe contractures noted.  Tight Achilles noted.   Radiographs: No fracture or malalignment. Mild chronic sclerosis base of fifth proximal phalanx. The joint spaces appear within normal limits. There is no evidence of fracture, dislocation, or joint effusion. There is no evidence of arthropathy or other focal bone abnormality. Soft tissues are unremarkable.  1. Apparent lateral midfoot degenerative changes which could be posttraumatic in etiology. No subluxation at the Lisfranc joint identified. The Lisfranc ligament appears intact. 2. No talar dome osteochondral lesion, tibiotalar or hindfoot arthropathy identified. 3. The ankle tendons and ligaments appear intact. Assessment:   No diagnosis found.   Plan:  Patient was evaluated and treated and all questions answered.  Left lateral ankle sprain/ATFL ligament with underlying capsulitis -Given that she still continues to have pain I believe she will benefit from another steroid injection of decrease acute inflammatory component associate with pain.  She is unable to transition out of the boot into regular shoes.  Patient agrees  plan would like to try another steroid injection -A steroid injection was performed at right ankle joint using 1% plain Lidocaine and 10 mg of Kenalog. This was well tolerated. -MRI was reviewed with the patient which did not show any kind of ankle involvement.  At this time I discussed that -I encouraged  her to reschedule physical therapy to help with mobilization and tiredness.  Patient agrees on plan to proceed with physical therapy for strengthening of the foot  Pes cavus -I explained to the patient the etiology of pes cavus foot structure and various treatment options were discussed.  Given that she is having generalized pain to the lower extremity likely due to no support in the shoes I believe she will benefit from orthotics.  -Orthotics were dispensed and are functioning well ligament tear  No follow-ups on file.

## 2021-11-13 ENCOUNTER — Telehealth (INDEPENDENT_AMBULATORY_CARE_PROVIDER_SITE_OTHER): Payer: Self-pay

## 2021-11-13 NOTE — Telephone Encounter (Signed)
This was received from Sherlyn Lick at [facility]:   MRN [id number]- this patient has never answered our calls nor returned our messages, I left a note on their door.

## 2021-12-04 ENCOUNTER — Telehealth: Payer: Self-pay | Admitting: Podiatry

## 2021-12-04 NOTE — Telephone Encounter (Signed)
Patient called she states you sent her to Physical therapy at [facility]. Patient states she has never heard from them and she cant get a hold of anyone in the office there for an appointment. Patient wants to know if you can send her somewhere else.

## 2021-12-05 ENCOUNTER — Ambulatory Visit: Payer: 59 | Admitting: Podiatry

## 2021-12-06 NOTE — Telephone Encounter (Signed)
Called pt let her know RX for PT is at the front desk. Patient coming in Friday to pick it up.

## 2021-12-25 ENCOUNTER — Ambulatory Visit: Payer: Self-pay | Admitting: *Deleted

## 2021-12-25 NOTE — Telephone Encounter (Signed)
Patient is scheduled for tomorrow morning at 1120 AM.

## 2021-12-25 NOTE — Telephone Encounter (Signed)
Reason for Disposition  [1] COVID-19 diagnosed by positive lab test (e.g., PCR, rapid self-test kit) AND [2] mild symptoms (e.g., cough, fever, others) AND [9] no complications or SOB  Answer Assessment - Initial Assessment Questions 1. COVID-19 DIAGNOSIS: "Who made your COVID-19 diagnosis?" "Was it confirmed by a positive lab test or self-test?" If not diagnosed by a doctor (or NP/PA), ask "Are there lots of cases (community spread) where you live?" Note: See public health department website, if unsure.     Home today 2. COVID-19 EXPOSURE: "Was there any known exposure to COVID before the symptoms began?" CDC Definition of close contact: within 6 feet (2 meters) for a total of 15 minutes or more over a 24-hour period.      Grandparent 3. ONSET: "When did the COVID-19 symptoms start?"      yesterday 4. WORST SYMPTOM: "What is your worst symptom?" (e.g., cough, fever, shortness of breath, muscle aches)     COugh 5. COUGH: "Do you have a cough?" If Yes, ask: "How bad is the cough?"       Yes, productive for yellowish phlegm 6. FEVER: "Do you have a fever?" If Yes, ask: "What is your temperature, how was it measured, and when did it start?"     Not presently 7. RESPIRATORY STATUS: "Describe your breathing?" (e.g., shortness of breath, wheezing, unable to speak)      Moderate 8. BETTER-SAME-WORSE: "Are you getting better, staying the same or getting worse compared to yesterday?"  If getting worse, ask, "In what way?"     Better 9. HIGH RISK DISEASE: "Do you have any chronic medical problems?" (e.g., asthma, heart or lung disease, weak immune system, obesity, etc.)      10. VACCINE: "Have you had the COVID-19 vaccine?" If Yes, ask: "Which one, how many shots, when did you get it?"       2 11. BOOSTER: "Have you received your COVID-19 booster?" If Yes, ask: "Which one and when did you get it?"       1  13. OTHER SYMPTOMS: "Do you have any other symptoms?"  (e.g., chills, fatigue, headache, loss  of smell or taste, muscle pain, sore throat)       CP with coughing only, "All over" 14. O2 SATURATION MONITOR:  "Do you use an oxygen saturation monitor (pulse oximeter) at home?" If Yes, ask "What is your reading (oxygen level) today?" "What is your usual oxygen saturation reading?" (e.g., 95%)       NA  Protocols used: Coronavirus (COVID-19) Diagnosed or Suspected-A-AH

## 2021-12-25 NOTE — Telephone Encounter (Signed)
°  Chief Complaint: Covid positive today Symptoms: Cough, headache,congestion, chest sore "From coughing" with coughing only. Frequency: Symptoms onset yesterday Pertinent Negatives: Patient denies fever,SOB Disposition: [] ED /[] Urgent Care (no appt availability in office) / [] Appointment(In office/virtual)/ []  [facility]/ [x] Home Care/ [] Refused Recommended Disposition  Additional Notes: HOme care advise given, reviewed guidelines for self isolation as well as symptoms that warrant an ED eval. Pt verbalizes understanding.  Pt is interested in oral anti -viral med. Assured pt NT would route to practice for PCPs review.

## 2021-12-26 ENCOUNTER — Telehealth (INDEPENDENT_AMBULATORY_CARE_PROVIDER_SITE_OTHER): Payer: 59 | Admitting: Internal Medicine

## 2021-12-26 ENCOUNTER — Encounter: Payer: Self-pay | Admitting: Internal Medicine

## 2021-12-26 ENCOUNTER — Telehealth: Payer: Self-pay

## 2021-12-26 VITALS — Temp 99.0°F | Ht 61.0 in

## 2021-12-26 DIAGNOSIS — U071 COVID-19: Secondary | ICD-10-CM

## 2021-12-26 MED ORDER — MOLNUPIRAVIR EUA 200MG CAPSULE: 4.0000 | ORAL_CAPSULE | Freq: Two times a day (BID) | ORAL | 0 refills | Status: AC

## 2021-12-26 NOTE — Patient Instructions (Signed)
Take Tylenol 650 - 1000 mg every 6-8 hours for fever, body aches and headache. Drink plenty of fluids with electrolytes. Monitor for fever that does not decrease and/or shortness of breath that worsens or is present at rest.   

## 2021-12-26 NOTE — Progress Notes (Signed)
Date:  12/26/2021   Name:  Mallory Johnston   DOB:  12/11/1991   MRN:  [id number]  This encounter was conducted via video encounter due to the need for social distancing in light of the Covid-19 pandemic.  The patient was correctly identified.  I advised that I am conducting the visit from a secure room in my office at [facility].  The patient is located at home. The limitations of this form of encounter were discussed with the patient and he/she agreed to proceed.  Some vital signs will be absent.  Chief Complaint: Covid Positive (Positive via home test 1 day ago symptoms started 3 days ago )  URI  This is a new problem. Episode onset: X3 days. The problem has been gradually worsening. The maximum temperature recorded prior to her arrival was 100.4 - 100.9 F. The fever has been present for 1 to 2 days. Associated symptoms include congestion, coughing, diarrhea (only on the first day), headaches, a plugged ear sensation, rhinorrhea, sneezing and a sore throat. Pertinent negatives include no nausea, vomiting or wheezing. She has tried acetaminophen and decongestant for the symptoms. The treatment provided mild relief.  Cough This is a new problem. The current episode started yesterday. The problem occurs hourly. The cough is Non-productive. Associated symptoms include chills, a fever, headaches, nasal congestion, rhinorrhea and a sore throat. Pertinent negatives include no shortness of breath or wheezing. She has tried OTC cough suppressant for the symptoms. The treatment provided moderate relief. There is no history of asthma.  Onset of cough 2 days ago.  Tested negative for Covid. Symptoms progressed with fever and cough. Yesterday tested positive.   Lab Results  Component Value Date   NA 139 03/05/2019   K 3.4 (L) 03/05/2019   CO2 25 03/05/2019   GLUCOSE 96 03/05/2019   BUN 9 09/24/2020   CREATININE 0.74 09/24/2020   CALCIUM 9.2 03/05/2019   GFRNONAA >60 09/24/2020   Lab  Results  Component Value Date   CHOL 148 03/08/2019   HDL 52 03/08/2019   LDLCALC 73 03/08/2019   TRIG 115 03/08/2019   CHOLHDL 2.8 03/08/2019   Lab Results  Component Value Date   TSH 1.201 03/08/2019   Lab Results  Component Value Date   HGBA1C 5.3 03/08/2019   Lab Results  Component Value Date   WBC 10.5 03/05/2019   HGB 12.7 03/05/2019   HCT 39.9 03/05/2019   MCV 90.7 03/05/2019   PLT 336 03/05/2019   Lab Results  Component Value Date   ALT 15 03/05/2019   AST 21 03/05/2019   ALKPHOS 65 03/05/2019   BILITOT 0.5 03/05/2019   No results found for: 25OHVITD2, 25OHVITD3, VD25OH   Review of Systems  Constitutional:  Positive for chills and fever.  HENT:  Positive for congestion, rhinorrhea, sneezing and sore throat.   Respiratory:  Positive for cough. Negative for shortness of breath and wheezing.   Gastrointestinal:  Positive for diarrhea (only on the first day). Negative for nausea and vomiting.  Neurological:  Positive for headaches.   Patient Active Problem List   Diagnosis Date Noted   Migraine without aura and without status migrainosus, not intractable 01/11/2021   Swelling of limb 11/29/2019   Lymphedema 11/29/2019   Noncompliance 03/06/2019   Class 1 obesity due to excess calories without serious comorbidity with body mass index (BMI) of 30.0 to 30.9 in adult 02/11/2017   History of cervical dysplasia 02/11/2017   Liver lesion, right  lobe 10/17/2016   Von Willebrand's disease 11/10/2015   PCOS (polycystic ovarian syndrome) 09/20/2015    Allergies  Allergen Reactions   Amoxicillin Rash   Percocet [Oxycodone-Acetaminophen] Nausea And Vomiting and Rash    Past Surgical History:  Procedure Laterality Date   BREAST REDUCTION SURGERY     LOWER EXTREMITY VENOGRAPHY Left 09/24/2020   Procedure: LOWER EXTREMITY VENOGRAPHY;  Surgeon: Annice Needy, MD;  Location: [facility] INVASIVE CV LAB;  Service: Cardiovascular;  Laterality: Left;   OVARIAN CYST REMOVAL      REDUCTION MAMMAPLASTY     TONSILLECTOMY      Social History   Tobacco Use   Smoking status: Never   Smokeless tobacco: Never  Vaping Use   Vaping Use: Never used  Substance Use Topics   Alcohol use: Yes    Comment: rare   Drug use: No     Medication list has been reviewed and updated.  Current Meds  Medication Sig   Levonorgestrel-Ethinyl Estradiol (AMETHIA) 0.15-0.03 &0.01 MG tablet Take 1 tablet by mouth at bedtime.   mirtazapine (REMERON) 30 MG tablet Take 1 tablet (30 mg total) by mouth at bedtime.   molnupiravir EUA (LAGEVRIO) 200 mg CAPS capsule Take 4 capsules (800 mg total) by mouth 2 (two) times daily for 5 days.   zolpidem (AMBIEN) 5 MG tablet Take 5 mg by mouth at bedtime as needed.   [DISCONTINUED] KARIVA 0.15-0.02/0.01 MG (21/5) tablet TAKE 1 TABLET BY MOUTH EVERYDAY AT BEDTIME   [DISCONTINUED] meclizine (ANTIVERT) 12.5 MG tablet Take 1 tablet (12.5 mg total) by mouth at bedtime.    PHQ 2/9 Scores 12/26/2021 01/11/2021 10/10/2020 04/08/2019  PHQ - 2 Score 0 0 6 2  PHQ- 9 Score 3 6 24 13     GAD 7 : Generalized Anxiety Score 12/26/2021 01/11/2021 10/10/2020 04/08/2019  Nervous, Anxious, on Edge 0 3 2 3   Control/stop worrying 0 3 3 3   Worry too much - different things 0 3 3 0  Trouble relaxing 0 3 3 1   Restless 0 2 2 3   Easily annoyed or irritable 0 3 3 1   Afraid - awful might happen 0 3 3 2   Total GAD 7 Score 0 20 19 13   Anxiety Difficulty - Not difficult at all Somewhat difficult -    BP Readings from Last 3 Encounters:  08/13/21 130/88  07/10/21 119/82  01/11/21 120/86    Physical Exam Constitutional:      Appearance: She is ill-appearing.  Pulmonary:     Effort: Pulmonary effort is normal.  Neurological:     General: No focal deficit present.     Mental Status: She is alert.  Psychiatric:        Mood and Affect: Mood normal.    Wt Readings from Last 3 Encounters:  08/13/21 209 lb (94.8 kg)  07/10/21 208 lb 11.2 oz (94.7 kg)  01/11/21 214  lb (97.1 kg)    Temp 99 F (37.2 C) (Oral)    Ht 5\' 1"  (1.549 m)    BMI 39.49 kg/m   Assessment and Plan: 1. COVID-19 virus infection Take Tylenol 650 - 1000 mg every 6-8 hours for fever, body aches and headache. Drink plenty of fluids with electrolytes. Monitor for fever that does not decrease and/or shortness of breath that worsens or is present at rest. Quarantine for 5 days. - molnupiravir EUA (LAGEVRIO) 200 mg CAPS capsule; Take 4 capsules (800 mg total) by mouth 2 (two) times daily for 5 days.  Dispense:  40 capsule; Refill: 0  I spent 8 minutes on this encounter.  100 % of the time was via video. Partially dictated using Animal nutritionist. Any errors are unintentional.  Bari Edward, MD [facility]  12/26/2021

## 2021-12-26 NOTE — Telephone Encounter (Signed)

## 2021-12-31 ENCOUNTER — Ambulatory Visit: Payer: BC Managed Care – PPO | Admitting: Podiatry

## 2022-01-05 ENCOUNTER — Other Ambulatory Visit: Payer: Self-pay | Admitting: Obstetrics and Gynecology

## 2022-01-05 DIAGNOSIS — N921 Excessive and frequent menstruation with irregular cycle: Secondary | ICD-10-CM

## 2022-01-05 DIAGNOSIS — Z01419 Encounter for gynecological examination (general) (routine) without abnormal findings: Secondary | ICD-10-CM

## 2022-02-11 ENCOUNTER — Encounter (INDEPENDENT_AMBULATORY_CARE_PROVIDER_SITE_OTHER): Payer: Self-pay | Admitting: Vascular Surgery

## 2022-02-11 ENCOUNTER — Other Ambulatory Visit: Payer: Self-pay

## 2022-02-11 ENCOUNTER — Ambulatory Visit (INDEPENDENT_AMBULATORY_CARE_PROVIDER_SITE_OTHER): Payer: 59 | Admitting: Vascular Surgery

## 2022-02-11 VITALS — BP 134/85 | HR 97 | Resp 16 | Wt 213.0 lb

## 2022-02-11 DIAGNOSIS — I890 Lymphedema, not elsewhere classified: Secondary | ICD-10-CM | POA: Diagnosis not present

## 2022-02-11 DIAGNOSIS — M7989 Other specified soft tissue disorders: Secondary | ICD-10-CM | POA: Diagnosis not present

## 2022-02-11 NOTE — Assessment & Plan Note (Signed)
Symptoms are roughly similar to what they were at last visit.  She has seen physical therapy and has been prescribed a TENS unit but has not yet received this.  We again discussed a lymphedema pump, but she is not comfortable having people in her house to set this up.  We will just follow her once a year at this point.

## 2022-02-11 NOTE — Assessment & Plan Note (Signed)
Stable  As above

## 2022-02-11 NOTE — Progress Notes (Signed)
MRN : [id number]  Mallory Johnston is a 31 y.o. (July 29, 1991) female who presents with chief complaint of  Chief Complaint  Patient presents with   Follow-up    6 month follow up  .  History of Present Illness: Patient returns today in follow up of her leg swelling and lymphedema.  Her symptoms are about the same.  She has not gotten her lymphedema pump but she does not want to have people in the house to set it up.  She has been going to physical therapy and is getting a TENS unit through them.  No ulceration or infection.  No fevers or chills.  Current Outpatient Medications  Medication Sig Dispense Refill   Levonorgestrel-Ethinyl Estradiol (AMETHIA) 0.15-0.03 &0.01 MG tablet TAKE 1 TABLET BY MOUTH EVERYDAY AT BEDTIME 91 tablet 1   mirtazapine (REMERON) 30 MG tablet Take 1 tablet (30 mg total) by mouth at bedtime. 30 tablet 3   zolpidem (AMBIEN) 5 MG tablet Take 5 mg by mouth at bedtime as needed.     No current facility-administered medications for this visit.    Past Medical History:  Diagnosis Date   H/O bilateral breast reduction surgery    Lymphadenopathy 2009   left leg   Lymphedema of left leg    Ovarian cyst    S/P tonsillectomy    Suicidal ideation 03/06/2019    Past Surgical History:  Procedure Laterality Date   BREAST REDUCTION SURGERY     LOWER EXTREMITY VENOGRAPHY Left 09/24/2020   Procedure: LOWER EXTREMITY VENOGRAPHY;  Surgeon: Algernon Huxley, MD;  Location: [facility] [facility];  Service: Cardiovascular;  Laterality: Left;   OVARIAN CYST REMOVAL     REDUCTION MAMMAPLASTY     TONSILLECTOMY       Social History   Tobacco Use   Smoking status: Never   Smokeless tobacco: Never  Vaping Use   Vaping Use: Never used  Substance Use Topics   Alcohol use: Yes    Comment: rare   Drug use: No       Family History  Problem Relation Age of Onset   Hypertension Father    Diabetes Mother    Breast cancer Neg Hx    Ovarian cancer Neg Hx    Colon cancer  Neg Hx      Allergies  Allergen Reactions   Amoxicillin Rash   Percocet [Oxycodone-Acetaminophen] Nausea And Vomiting and Rash    REVIEW OF SYSTEMS (Negative unless checked)   Constitutional: [] Weight loss  [] Fever  [] Chills Cardiac: [] Chest pain   [] Chest pressure   [] Palpitations   [] Shortness of breath when laying flat   [] Shortness of breath at rest   [] Shortness of breath with exertion. Vascular:  [x] Pain in legs with walking   [x] Pain in legs at rest   [] Pain in legs when laying flat   [] Claudication   [] Pain in feet when walking  [] Pain in feet at rest  [] Pain in feet when laying flat   [] History of DVT   [] Phlebitis   [x] Swelling in legs   [] Varicose veins   [] Non-healing ulcers Pulmonary:   [] Uses home oxygen   [] Productive cough   [] Hemoptysis   [] Wheeze  [] COPD   [] Asthma Neurologic:  [] Dizziness  [] Blackouts   [] Seizures   [] History of stroke   [] History of TIA  [] Aphasia   [] Temporary blindness   [] Dysphagia   [] Weakness or numbness in arms   [] Weakness or numbness in legs Musculoskeletal:  [] Arthritis   []   Joint swelling   [] Joint pain   [] Low back pain Hematologic:  [] Easy bruising  [] Easy bleeding   [] Hypercoagulable state   [] Anemic  [] Hepatitis Gastrointestinal:  [] Blood in stool   [] Vomiting blood  [] Gastroesophageal reflux/heartburn   [] Abdominal pain Genitourinary:  [] Chronic kidney disease   [] Difficult urination  [] Frequent urination  [] Burning with urination   [] Hematuria Skin:  [] Rashes   [] Ulcers   [] Wounds Psychological:  [x] History of anxiety   [x]  History of major depression.  Physical Examination  BP 134/85 (BP Location: Right Arm)    Pulse 97    Resp 16    Wt 213 lb (96.6 kg)    BMI 40.25 kg/m  Gen:  WD/WN, NAD Head: [place]/AT, No temporalis wasting. Ear/Nose/Throat: Hearing grossly intact, nares w/o erythema or drainage Eyes: Conjunctiva clear. Sclera non-icteric Neck: Supple.  Trachea midline Pulmonary:  Good air movement, no use of accessory muscles.   Cardiac: RRR, no JVD Vascular:  Vessel Right Left  Radial Palpable Palpable                          PT Palpable Palpable  DP Palpable Palpable   Gastrointestinal: soft, non-tender/non-distended. No guarding/reflex.  Musculoskeletal: M/S 5/5 throughout.  No deformity or atrophy.  Trace right lower extremity edema, 1+ left lower extremity edema. Neurologic: Sensation grossly intact in extremities.  Symmetrical.  Speech is fluent.  Psychiatric: Judgment intact, Mood & affect appropriate for pt's clinical situation. Dermatologic: No rashes or ulcers noted.  No cellulitis or open wounds.      Labs No results found for this or any previous visit (from the past 2160 hour(s)).  Radiology No results found.  Assessment/Plan Class 1 obesity due to excess calories without serious comorbidity with body mass index (BMI) of 30.0 to 30.9 in adult Weight loss of benefit to improve lower extremity swelling  Lymphedema Symptoms are roughly similar to what they were at last visit.  She has seen physical therapy and has been prescribed a TENS unit but has not yet received this.  We again discussed a lymphedema pump, but she is not comfortable having people in her house to set this up.  We will just follow her once a year at this point.  Swelling of limb Stable.  As above.    Leotis Pain, MD  02/11/2022 1:53 PM    This note was created with Dragon medical transcription system.  Any errors from dictation are purely unintentional

## 2022-03-06 ENCOUNTER — Other Ambulatory Visit: Payer: Self-pay

## 2022-03-06 ENCOUNTER — Ambulatory Visit (INDEPENDENT_AMBULATORY_CARE_PROVIDER_SITE_OTHER): Payer: 59 | Admitting: Internal Medicine

## 2022-03-06 ENCOUNTER — Encounter: Payer: Self-pay | Admitting: Internal Medicine

## 2022-03-06 VITALS — BP 128/82 | HR 101 | Ht 61.0 in | Wt 217.0 lb

## 2022-03-06 DIAGNOSIS — K529 Noninfective gastroenteritis and colitis, unspecified: Secondary | ICD-10-CM | POA: Diagnosis not present

## 2022-03-06 MED ORDER — ONDANSETRON HCL 4 MG PO TABS: 4.0000 mg | ORAL_TABLET | Freq: Three times a day (TID) | ORAL | 0 refills | Status: DC | PRN

## 2022-03-06 NOTE — Progress Notes (Signed)
? ? ?Date:  03/06/2022  ? ?Name:  Mallory Johnston   DOB:  September 23, 1991   MRN:  [id number] ? ? ?Chief Complaint: Diarrhea and Blood In Stools ? ?Diarrhea  ?This is a new problem. The current episode started in the past 7 days. The problem occurs 2 to 4 times per day. The stool consistency is described as Watery (no blood). Associated symptoms include bloating, headaches and increased flatus. Pertinent negatives include no abdominal pain, chills, coughing, fever or vomiting. Associated symptoms comments: Nausea, appetite loss.  ?She had a normal stool last week and noticed some scant BRB on the tissue and in the toilet.  No further bleeding has been seen. ?Several days ago had onset of nausea and bloating with very soft stools several times per day - triggered by eating or drinking.  She is not taking anything for this.  No fever or chills.  No known sick contacts. ? ?Lab Results  ?Component Value Date  ? NA 139 03/05/2019  ? K 3.4 (L) 03/05/2019  ? CO2 25 03/05/2019  ? GLUCOSE 96 03/05/2019  ? BUN 9 09/24/2020  ? CREATININE 0.74 09/24/2020  ? CALCIUM 9.2 03/05/2019  ? GFRNONAA >60 09/24/2020  ? ?Lab Results  ?Component Value Date  ? CHOL 148 03/08/2019  ? HDL 52 03/08/2019  ? LDLCALC 73 03/08/2019  ? TRIG 115 03/08/2019  ? CHOLHDL 2.8 03/08/2019  ? ?Lab Results  ?Component Value Date  ? TSH 1.201 03/08/2019  ? ?Lab Results  ?Component Value Date  ? HGBA1C 5.3 03/08/2019  ? ?Lab Results  ?Component Value Date  ? WBC 10.5 03/05/2019  ? HGB 12.7 03/05/2019  ? HCT 39.9 03/05/2019  ? MCV 90.7 03/05/2019  ? PLT 336 03/05/2019  ? ?Lab Results  ?Component Value Date  ? ALT 15 03/05/2019  ? AST 21 03/05/2019  ? ALKPHOS 65 03/05/2019  ? BILITOT 0.5 03/05/2019  ? ?No results found for: 25OHVITD2, 25OHVITD3, VD25OH  ? ?Review of Systems  ?Constitutional:  Negative for chills, fatigue and fever.  ?Respiratory:  Negative for cough, chest tightness, shortness of breath and wheezing.   ?Cardiovascular:  Negative for chest pain.   ?Gastrointestinal:  Positive for abdominal distention, bloating, diarrhea, flatus and nausea. Negative for abdominal pain, blood in stool, constipation and vomiting.  ?Neurological:  Positive for headaches.  ?Psychiatric/Behavioral:  Negative for dysphoric mood and sleep disturbance. The patient is not nervous/anxious.   ? ?Patient Active Problem List  ? Diagnosis Date Noted  ? Migraine without aura and without status migrainosus, not intractable 01/11/2021  ? Swelling of limb 11/29/2019  ? Lymphedema 11/29/2019  ? Noncompliance 03/06/2019  ? Class 1 obesity due to excess calories without serious comorbidity with body mass index (BMI) of 30.0 to 30.9 in adult 02/11/2017  ? History of cervical dysplasia 02/11/2017  ? Liver lesion, right lobe 10/17/2016  ? Von Willebrand's disease 11/10/2015  ? PCOS (polycystic ovarian syndrome) 09/20/2015  ? ? ?Allergies  ?Allergen Reactions  ? Amoxicillin Rash  ? Percocet [Oxycodone-Acetaminophen] Nausea And Vomiting and Rash  ? ? ?Past Surgical History:  ?Procedure Laterality Date  ? BREAST REDUCTION SURGERY    ? LOWER EXTREMITY VENOGRAPHY Left 09/24/2020  ? Procedure: LOWER EXTREMITY VENOGRAPHY;  Surgeon: Annice Needy, MD;  Location: [facility];  Service: Cardiovascular;  Laterality: Left;  ? OVARIAN CYST REMOVAL    ? REDUCTION MAMMAPLASTY    ? TONSILLECTOMY    ? ? ?Social History  ? ?Tobacco Use  ?  Smoking status: Never  ? Smokeless tobacco: Never  ?Vaping Use  ? Vaping Use: Never used  ?Substance Use Topics  ? Alcohol use: Yes  ?  Comment: rare  ? Drug use: No  ? ? ? ?Medication list has been reviewed and updated. ? ?Current Meds  ?Medication Sig  ? Levonorgestrel-Ethinyl Estradiol (AMETHIA) 0.15-0.03 &0.01 MG tablet TAKE 1 TABLET BY MOUTH EVERYDAY AT BEDTIME  ? mirtazapine (REMERON) 30 MG tablet Take 1 tablet (30 mg total) by mouth at bedtime.  ? zolpidem (AMBIEN) 5 MG tablet Take 5 mg by mouth at bedtime as needed.  ? ? ?PHQ 2/9 Scores 03/06/2022 12/26/2021 01/11/2021  10/10/2020  ?PHQ - 2 Score 1 0 0 6  ?PHQ- 9 Score 4 3 6 24   ? ? ?GAD 7 : Generalized Anxiety Score 03/06/2022 12/26/2021 01/11/2021 10/10/2020  ?Nervous, Anxious, on Edge 1 0 3 2  ?Control/stop worrying 1 0 3 3  ?Worry too much - different things 2 0 3 3  ?Trouble relaxing 1 0 3 3  ?Restless 0 0 2 2  ?Easily annoyed or irritable 2 0 3 3  ?Afraid - awful might happen 1 0 3 3  ?Total GAD 7 Score 8 0 20 19  ?Anxiety Difficulty Not difficult at all - Not difficult at all Somewhat difficult  ? ? ?BP Readings from Last 3 Encounters:  ?03/06/22 128/82  ?02/11/22 134/85  ?08/13/21 130/88  ? ? ?Physical Exam ?Constitutional:   ?   General: She is not in acute distress. ?   Appearance: Normal appearance.  ?Cardiovascular:  ?   Rate and Rhythm: Normal rate and regular rhythm.  ?Pulmonary:  ?   Effort: Pulmonary effort is normal.  ?   Breath sounds: No wheezing or rhonchi.  ?Abdominal:  ?   General: Bowel sounds are decreased.  ?   Palpations: Abdomen is soft.  ?   Tenderness: There is no abdominal tenderness. There is no guarding or rebound.  ?Neurological:  ?   Mental Status: She is alert.  ? ? ?Wt Readings from Last 3 Encounters:  ?03/06/22 217 lb (98.4 kg)  ?02/11/22 213 lb (96.6 kg)  ?08/13/21 209 lb (94.8 kg)  ? ? ?BP 128/82   Pulse (!) 101   Ht 5\' 1"  (1.549 m)   Wt 217 lb (98.4 kg)   SpO2 99%   BMI 41.00 kg/m?  ? ?Assessment and Plan: ?1. Gastroenteritis ?Symptoms consistent with a GI virus. ?Recommend zofran tid for nausea; push fluids and advance diet as tolerated. ?- ondansetron (ZOFRAN) 4 MG tablet; Take 1 tablet (4 mg total) by mouth every 8 (eight) hours as needed for nausea or vomiting.  Dispense: 20 tablet; Refill: 0 ? ? ?Partially dictated using 08/15/21. Any errors are unintentional. ? ? , MD ?[facility] ?[facility] ? ?03/06/2022 ? ? ? ? ? ?

## 2022-07-02 ENCOUNTER — Encounter: Payer: Self-pay | Admitting: Emergency Medicine

## 2022-07-02 ENCOUNTER — Other Ambulatory Visit: Payer: Self-pay

## 2022-07-02 ENCOUNTER — Ambulatory Visit
Admission: EM | Admit: 2022-07-02 | Discharge: 2022-07-02 | Disposition: A | Discharge status: Home / Self Care | Payer: BC Managed Care – PPO | Attending: Emergency Medicine | Admitting: Emergency Medicine

## 2022-07-02 DIAGNOSIS — U071 COVID-19: Secondary | ICD-10-CM

## 2022-07-02 LAB — RESP PANEL BY RT-PCR (FLU A&B, COVID) ARPGX2
Influenza A by PCR: NEGATIVE
Influenza B by PCR: NEGATIVE
SARS Coronavirus 2 by RT PCR: POSITIVE — AB

## 2022-07-02 MED ORDER — BENZONATATE 100 MG PO CAPS: 200.0000 mg | ORAL_CAPSULE | Freq: Three times a day (TID) | ORAL | 0 refills | Status: DC

## 2022-07-02 MED ORDER — IPRATROPIUM BROMIDE 0.06 % NA SOLN: 2.0000 | Freq: Four times a day (QID) | NASAL | 12 refills | Status: DC

## 2022-07-02 MED ORDER — MOLNUPIRAVIR EUA 200MG CAPSULE: 4.0000 | ORAL_CAPSULE | Freq: Two times a day (BID) | ORAL | 0 refills | Status: AC

## 2022-07-02 MED ORDER — PROMETHAZINE-DM 6.25-15 MG/5ML PO SYRP: 5.0000 mL | ORAL_SOLUTION | Freq: Four times a day (QID) | ORAL | 0 refills | Status: DC | PRN

## 2022-07-02 NOTE — Discharge Instructions (Addendum)
You will have to quarantine for 5 days from the start of your symptoms.  After 5 days you can break quarantine if your symptoms have improved and you have not had a fever for 24 hours without taking Tylenol or ibuprofen.  Use over-the-counter Tylenol and ibuprofen as needed for body aches and fever.  Use the Atrovent nasal spray, 2 squirts in each nostril every 6 hours, as needed for runny nose and postnasal drip.  Use the Tessalon Perles every 8 hours during the day.  Take them with a small sip of water.  They may give you some numbness to the base of your tongue or a metallic taste in your mouth, this is normal.  Use the Promethazine DM cough syrup at bedtime for cough and congestion.  It will make you drowsy so do not take it during the day.  Take the molnupiravir twice daily for 5 days for treatment of COVID-19  If you develop any increased shortness of breath-especially at rest, you are unable to speak in full sentences, or is a late sign your lips are turning blue you need to go the ER for evaluation.  

## 2022-07-02 NOTE — ED Provider Notes (Signed)
[facility]    CSN: [id number] Arrival date & time: 07/02/22  1734      History   Chief Complaint Chief Complaint  Patient presents with   Ear Fullness    right   Cough    HPI Mallory Johnston is a 31 y.o. female.   HPI  31 year old female here for evaluation of respiratory complaints.  Patient reports her symptoms began 3 days ago started with a loss of taste followed by a nasal congestion, ear fullness, decreased hearing, fever, and nonproductive cough.  She denies any nasal discharge, drainage from her ear, sore throat, shortness of breath, or wheezing.  She has taken 2 home COVID test that were negative.  Past Medical History:  Diagnosis Date   H/O bilateral breast reduction surgery    Lymphadenopathy 2009   left leg   Lymphedema of left leg    Ovarian cyst    S/P tonsillectomy    Suicidal ideation 03/06/2019    Patient Active Problem List   Diagnosis Date Noted   Migraine without aura and without status migrainosus, not intractable 01/11/2021   Swelling of limb 11/29/2019   Lymphedema 11/29/2019   Noncompliance 03/06/2019   Class 1 obesity due to excess calories without serious comorbidity with body mass index (BMI) of 30.0 to 30.9 in adult 02/11/2017   History of cervical dysplasia 02/11/2017   Liver lesion, right lobe 10/17/2016   Von Willebrand's disease (HCC) 11/10/2015   PCOS (polycystic ovarian syndrome) 09/20/2015    Past Surgical History:  Procedure Laterality Date   BREAST REDUCTION SURGERY     LOWER EXTREMITY VENOGRAPHY Left 09/24/2020   Procedure: LOWER EXTREMITY VENOGRAPHY;  Surgeon: Annice Needy, MD;  Location: [facility];  Service: Cardiovascular;  Laterality: Left;   OVARIAN CYST REMOVAL     REDUCTION MAMMAPLASTY     TONSILLECTOMY      OB History     Gravida  1   Para      Term      Preterm      AB  1   Living         SAB  1   IAB      Ectopic      Multiple      Live Births                Home Medications    Prior to Admission medications   Medication Sig Start Date End Date Taking? Authorizing Provider  benzonatate (TESSALON) 100 MG capsule Take 2 capsules (200 mg total) by mouth every 8 (eight) hours. 07/02/22  Yes Becky Augusta, NP  ipratropium (ATROVENT) 0.06 % nasal spray Place 2 sprays into both nostrils 4 (four) times daily. 07/02/22  Yes Becky Augusta, NP  Levonorgestrel-Ethinyl Estradiol (AMETHIA) 0.15-0.03 &0.01 MG tablet TAKE 1 TABLET BY MOUTH EVERYDAY AT BEDTIME 01/06/22  Yes Linzie Collin, MD  mirtazapine (REMERON) 30 MG tablet Take 1 tablet (30 mg total) by mouth at bedtime. 10/10/20  Yes Reubin Milan, MD  molnupiravir EUA (LAGEVRIO) 200 mg CAPS capsule Take 4 capsules (800 mg total) by mouth 2 (two) times daily for 5 days. 07/02/22 07/07/22 Yes Becky Augusta, NP  promethazine-dextromethorphan (PROMETHAZINE-DM) 6.25-15 MG/5ML syrup Take 5 mLs by mouth 4 (four) times daily as needed. 07/02/22  Yes Becky Augusta, NP  zolpidem (AMBIEN) 5 MG tablet Take 5 mg by mouth at bedtime as needed. 01/10/21   [provider]    Family History  Family History  Problem Relation Age of Onset   Hypertension Father    Diabetes Mother    Breast cancer Neg Hx    Ovarian cancer Neg Hx    Colon cancer Neg Hx     Social History Social History   Tobacco Use   Smoking status: Never   Smokeless tobacco: Never  Vaping Use   Vaping Use: Never used  Substance Use Topics   Alcohol use: Yes    Comment: rare   Drug use: No     Allergies   Amoxicillin and Percocet [oxycodone-acetaminophen]   Review of Systems Review of Systems  Constitutional:  Positive for fever.  HENT:  Positive for congestion, ear pain and hearing loss. Negative for rhinorrhea and sore throat.   Respiratory:  Positive for cough. Negative for shortness of breath and wheezing.   Hematological: Negative.   Psychiatric/Behavioral: Negative.       Physical Exam Triage Vital Signs ED Triage  Vitals  Enc Vitals Group     BP 07/02/22 1756 (!) 138/96     Pulse Rate 07/02/22 1756 (!) 123     Resp 07/02/22 1756 18     Temp 07/02/22 1756 100 F (37.8 C)     Temp Source 07/02/22 1756 Oral     SpO2 07/02/22 1756 100 %     Weight 07/02/22 1753 216 lb 14.9 oz (98.4 kg)     Height 07/02/22 1753 5\' 1"  (1.549 m)     Head Circumference --      Peak Flow --      Pain Score 07/02/22 1752 0     Pain Loc --      Pain Edu? --      Excl. in GC? --    No data found.  Updated Vital Signs BP (!) 138/96 (BP Location: Right Arm)   Pulse (!) 123   Temp 100 F (37.8 C) (Oral)   Resp 18   Ht 5\' 1"  (1.549 m)   Wt 216 lb 14.9 oz (98.4 kg)   LMP 05/26/2022 (Approximate)   SpO2 100%   BMI 40.99 kg/m   Visual Acuity Right Eye Distance:   Left Eye Distance:   Bilateral Distance:    Right Eye Near:   Left Eye Near:    Bilateral Near:     Physical Exam Vitals and nursing note reviewed.  Constitutional:      Appearance: Normal appearance. She is not ill-appearing.  HENT:     Head: Normocephalic and atraumatic.     Right Ear: Ear canal and external ear normal. There is no impacted cerumen.     Left Ear: Tympanic membrane, ear canal and external ear normal. There is no impacted cerumen.     Nose: Congestion and rhinorrhea present.     Mouth/Throat:     Mouth: Mucous membranes are moist.     Pharynx: Oropharynx is clear. No oropharyngeal exudate or posterior oropharyngeal erythema.  Cardiovascular:     Rate and Rhythm: Normal rate and regular rhythm.     Pulses: Normal pulses.     Heart sounds: Normal heart sounds. No murmur heard.    No friction rub. No gallop.  Pulmonary:     Effort: Pulmonary effort is normal.     Breath sounds: Normal breath sounds. No wheezing, rhonchi or rales.  Musculoskeletal:     Cervical back: Normal range of motion.  Lymphadenopathy:     Cervical: No cervical adenopathy.  Skin:    General: Skin  is warm and dry.     Capillary Refill: Capillary  refill takes less than 2 seconds.     Findings: No erythema or rash.  Neurological:     General: No focal deficit present.     Mental Status: She is alert and oriented to person, place, and time.  Psychiatric:        Mood and Affect: Mood normal.        Behavior: Behavior normal.        Thought Content: Thought content normal.        Judgment: Judgment normal.      UC Treatments / Results  Labs (all labs ordered are listed, but only abnormal results are displayed) Labs Reviewed  RESP PANEL BY RT-PCR (FLU A&B, COVID) ARPGX2 - Abnormal; Notable for the following components:      Result Value   SARS Coronavirus 2 by RT PCR POSITIVE (*)    All other components within normal limits    EKG   Radiology No results found.  Procedures Procedures (including critical care time)  Medications Ordered in UC Medications - No data to display  Initial Impression / Assessment and Plan / UC Course  I have reviewed the triage vital signs and the nursing notes.  Pertinent labs & imaging results that were available during my care of the patient were reviewed by me and considered in my medical decision making (see chart for details).  Patient is a very pleasant, nontoxic-appearing 31 year old female here for evaluation of respiratory symptoms as outlined in HPI above.  Her physical exam reveals an erythematous and injected right tympanic membrane with loss of landmarks.  The external auditory canal is clear.  Left TM is pearly gray in appearance with normal light reflex and clear external auditory canal.  Nasal mucosa is markedly edematous and erythematous with clear discharge in both nares.  Oropharyngeal exam is benign.  No cervical lymphadenopathy appreciable exam.  Cardiopulmonary exam reveals S1-S2 heart sounds with regular rate and rhythm and lung sounds that are clear to auscultation all fields.  Despite the fact that patient's had 2 negative home COVID test however will check COVID and  influenza PCR here.  If those are negative I will treat her for otitis media with a round of antibiotics as well as medication to help with cough and congestion.  Respiratory Tri-Plex panel positive for COVID.  I will discharge patient home molnupiravir twice daily for 5 days as well as Atrovent nasal spray, Tessalon Perles, and Promethazine DM cough syrup.  Work note provided to cover the quarantine duration.   Final Clinical Impressions(s) / UC Diagnoses   Final diagnoses:  COVID-19     Discharge Instructions      You will have to quarantine for 5 days from the start of your symptoms.  After 5 days you can break quarantine if your symptoms have improved and you have not had a fever for 24 hours without taking Tylenol or ibuprofen.  Use over-the-counter Tylenol and ibuprofen as needed for body aches and fever.  Use the Atrovent nasal spray, 2 squirts in each nostril every 6 hours, as needed for runny nose and postnasal drip.  Use the Tessalon Perles every 8 hours during the day.  Take them with a small sip of water.  They may give you some numbness to the base of your tongue or a metallic taste in your mouth, this is normal.  Use the Promethazine DM cough syrup at bedtime for cough and congestion.  It will make you drowsy so do not take it during the day.  Take the molnupiravir twice daily for 5 days for treatment of COVID-19.  If you develop any increased shortness of breath-especially at rest, you are unable to speak in full sentences, or is a late sign your lips are turning blue you need to go the ER for evaluation.      ED Prescriptions     Medication Sig Dispense Auth. Provider   molnupiravir EUA (LAGEVRIO) 200 mg CAPS capsule Take 4 capsules (800 mg total) by mouth 2 (two) times daily for 5 days. 40 capsule Becky Augusta, NP   benzonatate (TESSALON) 100 MG capsule Take 2 capsules (200 mg total) by mouth every 8 (eight) hours. 21 capsule Becky Augusta, NP   ipratropium  (ATROVENT) 0.06 % nasal spray Place 2 sprays into both nostrils 4 (four) times daily. 15 mL Becky Augusta, NP   promethazine-dextromethorphan (PROMETHAZINE-DM) 6.25-15 MG/5ML syrup Take 5 mLs by mouth 4 (four) times daily as needed. 118 mL Becky Augusta, NP      PDMP not reviewed this encounter.   Becky Augusta, NP 07/02/22 [phone]

## 2022-07-02 NOTE — ED Triage Notes (Signed)
Pt c/o cough, fever, ear fullness, nasal congestion. Started about 3 days ago. She states she has tested her self for covid twice and has been negative.

## 2022-07-13 ENCOUNTER — Other Ambulatory Visit: Payer: Self-pay | Admitting: Obstetrics and Gynecology

## 2022-07-13 DIAGNOSIS — Z01419 Encounter for gynecological examination (general) (routine) without abnormal findings: Secondary | ICD-10-CM

## 2022-07-13 DIAGNOSIS — N921 Excessive and frequent menstruation with irregular cycle: Secondary | ICD-10-CM

## 2022-07-17 ENCOUNTER — Encounter: Payer: Self-pay | Admitting: Obstetrics and Gynecology

## 2022-07-17 ENCOUNTER — Ambulatory Visit (INDEPENDENT_AMBULATORY_CARE_PROVIDER_SITE_OTHER): Payer: BC Managed Care – PPO | Admitting: Obstetrics and Gynecology

## 2022-07-17 VITALS — BP 119/85 | HR 97 | Ht 61.0 in | Wt 214.9 lb

## 2022-07-17 DIAGNOSIS — N921 Excessive and frequent menstruation with irregular cycle: Secondary | ICD-10-CM

## 2022-07-17 DIAGNOSIS — Z01419 Encounter for gynecological examination (general) (routine) without abnormal findings: Secondary | ICD-10-CM

## 2022-07-17 DIAGNOSIS — Z01411 Encounter for gynecological examination (general) (routine) with abnormal findings: Secondary | ICD-10-CM

## 2022-07-17 NOTE — Progress Notes (Signed)
Patients presents for annual exam today. She states abnormal menstrual bleeding with 3 month OCP's along with mild to severe cramping. Patient states no other questions or concerns at this time.

## 2022-07-17 NOTE — Progress Notes (Signed)
HPI:      [name] is a 31 y.o. G1P0010 who LMP was Patient's last menstrual period was 04/29/2022 (approximate).  Subjective:   She presents today for her annual examination.  She has been taking Seasonique OCPs and has now bled daily for the last 3 months.  She reports many of the days are heavy bleeding.  This is also associated with significant cramping.  She has not tried multiple types of OCPs and continues to have heavy bleeding despite taking them correctly. Of significant note, patient has von Willebrand's disease. She is ready to try something else to help control her bleeding.    Hx: The following portions of the patient's history were reviewed and updated as appropriate:             She  has a past medical history of H/O bilateral breast reduction surgery, Lymphadenopathy (2009), Lymphedema of left leg, Ovarian cyst, S/P tonsillectomy, and Suicidal ideation (03/06/2019). She does not have any pertinent problems on file. She  has a past surgical history that includes Tonsillectomy; Breast reduction surgery; Ovarian cyst removal; LOWER EXTREMITY VENOGRAPHY (Left, 09/24/2020); and Reduction mammaplasty. Her family history includes Diabetes in her mother; Hypertension in her father. She  reports that she has never smoked. She has never used smokeless tobacco. She reports current alcohol use. She reports that she does not use drugs. She has a current medication list which includes the following prescription(s): levonorgestrel-ethinyl estradiol and mirtazapine. She is allergic to salmon [fish oil], amoxicillin, and percocet [oxycodone-acetaminophen].       Review of Systems:  Review of Systems  Constitutional: Denied constitutional symptoms, night sweats, recent illness, fatigue, fever, insomnia and weight loss.  Eyes: Denied eye symptoms, eye pain, photophobia, vision change and visual disturbance.  Ears/Nose/Throat/Neck: Denied ear, nose, throat or neck symptoms, hearing  loss, nasal discharge, sinus congestion and sore throat.  Cardiovascular: Denied cardiovascular symptoms, arrhythmia, chest pain/pressure, edema, exercise intolerance, orthopnea and palpitations.  Respiratory: Denied pulmonary symptoms, asthma, pleuritic pain, productive sputum, cough, dyspnea and wheezing.  Gastrointestinal: Denied, gastro-esophageal reflux, melena, nausea and vomiting.  Genitourinary: See HPI for additional information.  Musculoskeletal: Denied musculoskeletal symptoms, stiffness, swelling, muscle weakness and myalgia.  Dermatologic: Denied dermatology symptoms, rash and scar.  Neurologic: Denied neurology symptoms, dizziness, headache, neck pain and syncope.  Psychiatric: Denied psychiatric symptoms, anxiety and depression.  Endocrine: Denied endocrine symptoms including hot flashes and night sweats.   Meds:   Current Outpatient Medications on File Prior to Visit  Medication Sig Dispense Refill   Levonorgestrel-Ethinyl Estradiol (AMETHIA) 0.15-0.03 &0.01 MG tablet TAKE 1 TABLET BY MOUTH EVERYDAY AT BEDTIME 91 tablet 1   mirtazapine (REMERON) 30 MG tablet Take 1 tablet (30 mg total) by mouth at bedtime. 30 tablet 3   No current facility-administered medications on file prior to visit.     Objective:     Vitals:   07/17/22 0912 07/17/22 0923  BP: (!) 168/80 119/85  Pulse: 97     Filed Weights   07/17/22 0912  Weight: 214 lb 14.4 oz (97.5 kg)              Physical examination General NAD, Conversant  HEENT Atraumatic; Op clear with mmm.  Normo-cephalic. Pupils reactive. Anicteric sclerae  Thyroid/Neck Smooth without nodularity or enlargement. Normal ROM.  Neck Supple.  Skin No rashes, lesions or ulceration. Normal palpated skin turgor. No nodularity.  Breasts: No masses or discharge.  Symmetric.  No axillary adenopathy.  Lungs: Clear to  auscultation.No rales or wheezes. Normal Respiratory effort, no retractions.  Heart: NSR.  No murmurs or rubs appreciated.  No periferal edema  Abdomen: Soft.  Non-tender.  No masses.  No HSM. No hernia  Extremities: Moves all appropriately.  Normal ROM for age. No lymphadenopathy.  Neuro: Oriented to PPT.  Normal mood. Normal affect.     Pelvic: Deferred to IUD insertion     Assessment:    G1P0010 Patient Active Problem List   Diagnosis Date Noted   Migraine without aura and without status migrainosus, not intractable 01/11/2021   Swelling of limb 11/29/2019   Lymphedema 11/29/2019   Noncompliance 03/06/2019   Class 1 obesity due to excess calories without serious comorbidity with body mass index (BMI) of 30.0 to 30.9 in adult 02/11/2017   History of cervical dysplasia 02/11/2017   Liver lesion, right lobe 10/17/2016   Von Willebrand's disease (HCC) 11/10/2015   PCOS (polycystic ovarian syndrome) 09/20/2015     1. Well woman exam with routine gynecological exam   2. Breakthrough bleeding on OCPs     Von Willebrand's likely contributing to her heavy menstrual bleeding and failure of OCPs.   Plan:            1.  Basic Screening Recommendations The basic screening recommendations for asymptomatic women were discussed with the patient during her visit.  The age-appropriate recommendations were discussed with her and the rational for the tests reviewed.  When I am informed by the patient that another primary care physician has previously obtained the age-appropriate tests and they are up-to-date, only outstanding tests are ordered and referrals given as necessary.  Abnormal results of tests will be discussed with her when all of her results are completed.  Routine preventative health maintenance measures emphasized: Exercise/Diet/Weight control, Tobacco Warnings, Alcohol/Substance use risks and Stress Management  2.  We discussed multiple options and patient has chosen not to restart OCPs.  She would like an IUD (Mirena) for cycle and birth control.  This seems like a good option in light of her  breakthrough bleeding on OCPs and von Willebrand's disease.  Orders No orders of the defined types were placed in this encounter.   No orders of the defined types were placed in this encounter.         F/U  Return in about 1 week (around 07/24/2022).  Elonda Husky, M.D. 07/17/2022 9:42 AM

## 2022-07-29 ENCOUNTER — Encounter: Payer: BC Managed Care – PPO | Admitting: Obstetrics and Gynecology

## 2022-08-13 ENCOUNTER — Ambulatory Visit (INDEPENDENT_AMBULATORY_CARE_PROVIDER_SITE_OTHER): Payer: BC Managed Care – PPO | Admitting: Obstetrics and Gynecology

## 2022-08-13 ENCOUNTER — Encounter: Payer: Self-pay | Admitting: Obstetrics and Gynecology

## 2022-08-13 VITALS — BP 118/83 | HR 98 | Resp 16 | Ht 61.0 in | Wt 218.7 lb

## 2022-08-13 DIAGNOSIS — N921 Excessive and frequent menstruation with irregular cycle: Secondary | ICD-10-CM

## 2022-08-13 DIAGNOSIS — D6800 Von Willebrand disease, unspecified: Secondary | ICD-10-CM

## 2022-08-13 DIAGNOSIS — E282 Polycystic ovarian syndrome: Secondary | ICD-10-CM | POA: Diagnosis not present

## 2022-08-13 DIAGNOSIS — N926 Irregular menstruation, unspecified: Secondary | ICD-10-CM | POA: Diagnosis not present

## 2022-08-13 DIAGNOSIS — N912 Amenorrhea, unspecified: Secondary | ICD-10-CM

## 2022-08-13 LAB — POCT URINE PREGNANCY: Preg Test, Ur: NEGATIVE

## 2022-08-13 MED ORDER — SLYND 4 MG PO TABS: 1.0000 | ORAL_TABLET | Freq: Every day | ORAL | 1 refills | Status: AC

## 2022-08-13 NOTE — Progress Notes (Unsigned)
    GYNECOLOGY PROGRESS NOTE  Subjective:    Patient ID: Mallory Johnston, female    DOB: 1991-07-02, 31 y.o.   MRN: [id number]  HPI  Patient is a 31 y.o. G53P0010 female who presents for second opinion for management of PCOS with irregular cycles and menorrhagia.  Recently seen by Dr. Brennan Bailey of [facility]. She has been taking Seasonique OCPs and has since bled daily for the last 3 months.  She reports many of the days are heavy bleeding.  This is also associated with significant cramping.  She has not tried multiple types of OCPs and continues to have heavy bleeding despite taking them correctly. Patient has von Willebrand's disease. Patient reports that she stopped taking the OCP and her bleeding resolved. She does not want an IUD because of her mother's concern for her  underlying health conditions.  Stopped bleeding August 2nd.   Thinks she may have had a cyst as she has had pain last week, but since has resolved.    The following portions of the patient's history were reviewed and updated as appropriate: allergies, current medications, past family history, past medical history, past social history, past surgical history, and problem list.   Review of Systems Pertinent items are noted in HPI.   Objective:   Blood pressure 118/83, pulse 98, resp. rate 16, height 5\' 1"  (1.549 m), weight 218 lb 11.2 oz (99.2 kg), last menstrual period 04/29/2022.  Body mass index is 41.32 kg/m.  General appearance: alert, cooperative, and no distress Remainder of exam deferred.    Assessment:   1. PCOS (polycystic ovarian syndrome)   2. Irregular menses   3. Von Willebrand's disease [facility])    Plan:   Lengthy discussion had with patient regarding management of menstrual cycles with h/o PCOS and Von Willebrand's disease. Advised that based on medical history, would recommend trial of progesterone-only methods.  Is not interested in conception at this time.  Patient ok to trial progesterone-only  pills.  If this helps to manage her cycles, is more open to considering IUD.    A total of 25 minutes were spent during this encounter, including review of previous progress notes, recent imaging and labs, face-to-face with time with patient involving counseling and coordination of care, as well as documentation for current visit.   [facility], MD [facility]

## 2022-09-09 NOTE — Progress Notes (Unsigned)
    GYNECOLOGY PROGRESS NOTE  Subjective:    Patient ID: Mallory Johnston, female    DOB: [birth year], 31 y.o.   MRN: [id number]  HPI  Patient is a 31 y.o. G39P0010 female who presents for evaluation of left breast swelling.  {Common ambulatory SmartLinks:19316}  Review of Systems {ros; complete:30496}   Objective:   There were no vitals taken for this visit. There is no height or weight on file to calculate BMI. General appearance: {general exam:16600} Abdomen: {abdominal exam:16834} Pelvic: {pelvic exam:16852::"cervix normal in appearance","external genitalia normal","no adnexal masses or tenderness","no cervical motion tenderness","rectovaginal septum normal","uterus normal size, shape, and consistency","vagina normal without discharge"} Extremities: {extremity exam:5109} Neurologic: {neuro exam:17854}   Assessment:   No diagnosis found.   Plan:   There are no diagnoses linked to this encounter.    Hildred Laser, MD [facility]

## 2022-09-10 ENCOUNTER — Encounter: Payer: Self-pay | Admitting: Obstetrics and Gynecology

## 2022-09-10 ENCOUNTER — Other Ambulatory Visit: Payer: Self-pay | Admitting: Obstetrics and Gynecology

## 2022-09-10 ENCOUNTER — Ambulatory Visit (INDEPENDENT_AMBULATORY_CARE_PROVIDER_SITE_OTHER): Payer: BC Managed Care – PPO | Admitting: Obstetrics and Gynecology

## 2022-09-10 VITALS — BP 125/81 | HR 88 | Resp 16 | Ht 61.0 in | Wt 215.2 lb

## 2022-09-10 DIAGNOSIS — N63 Unspecified lump in unspecified breast: Secondary | ICD-10-CM | POA: Diagnosis not present

## 2022-09-24 ENCOUNTER — Other Ambulatory Visit: Payer: Self-pay | Admitting: Internal Medicine

## 2022-09-24 ENCOUNTER — Encounter: Payer: Self-pay | Admitting: Internal Medicine

## 2022-09-24 DIAGNOSIS — F332 Major depressive disorder, recurrent severe without psychotic features: Secondary | ICD-10-CM

## 2022-09-24 NOTE — Telephone Encounter (Signed)
Pts response.  KP

## 2022-09-29 ENCOUNTER — Ambulatory Visit
Admission: RE | Admit: 2022-09-29 | Discharge: 2022-09-29 | Disposition: A | Discharge status: Home / Self Care | Payer: BC Managed Care – PPO | Source: Ambulatory Visit | Attending: Obstetrics and Gynecology | Admitting: Obstetrics and Gynecology

## 2022-09-29 DIAGNOSIS — N63 Unspecified lump in unspecified breast: Secondary | ICD-10-CM | POA: Diagnosis present

## 2022-10-01 ENCOUNTER — Encounter: Payer: Self-pay | Admitting: Internal Medicine

## 2022-10-01 ENCOUNTER — Ambulatory Visit: Payer: BC Managed Care – PPO | Admitting: Internal Medicine

## 2022-10-01 VITALS — BP 124/80 | HR 82 | Ht 61.0 in | Wt 217.0 lb

## 2022-10-01 DIAGNOSIS — D6800 Von Willebrand disease, unspecified: Secondary | ICD-10-CM | POA: Diagnosis not present

## 2022-10-01 DIAGNOSIS — F332 Major depressive disorder, recurrent severe without psychotic features: Secondary | ICD-10-CM | POA: Diagnosis not present

## 2022-10-01 DIAGNOSIS — F4329 Adjustment disorder with other symptoms: Secondary | ICD-10-CM | POA: Diagnosis not present

## 2022-10-01 MED ORDER — MIRTAZAPINE 45 MG PO TABS: 45.0000 mg | ORAL_TABLET | Freq: Every day | ORAL | 1 refills | Status: DC

## 2022-10-01 NOTE — Progress Notes (Signed)
Date:  10/01/2022   Name:  Mallory Johnston   DOB:  09/19/1991   MRN:  [id number]   Chief Complaint: Depression  Depression        This is a recurrent problem.  The problem occurs daily.The problem is unchanged (worsened with her grandmother's death in 08-14-2023).  Associated symptoms include hopelessness, insomnia, irritable, decreased interest, appetite change and sad.  Associated symptoms include no fatigue and no suicidal ideas.   Lab Results  Component Value Date   NA 139 03/05/2019   K 3.4 (L) 03/05/2019   CO2 25 03/05/2019   GLUCOSE 96 03/05/2019   BUN 9 09/24/2020   CREATININE 0.74 09/24/2020   CALCIUM 9.2 03/05/2019   GFRNONAA >60 09/24/2020   Lab Results  Component Value Date   CHOL 148 03/08/2019   HDL 52 03/08/2019   LDLCALC 73 03/08/2019   TRIG 115 03/08/2019   CHOLHDL 2.8 03/08/2019   Lab Results  Component Value Date   TSH 1.201 03/08/2019   Lab Results  Component Value Date   HGBA1C 5.3 03/08/2019   Lab Results  Component Value Date   WBC 10.5 03/05/2019   HGB 12.7 03/05/2019   HCT 39.9 03/05/2019   MCV 90.7 03/05/2019   PLT 336 03/05/2019   Lab Results  Component Value Date   ALT 15 03/05/2019   AST 21 03/05/2019   ALKPHOS 65 03/05/2019   BILITOT 0.5 03/05/2019   No results found for: "25OHVITD2", "25OHVITD3", "VD25OH"   Review of Systems  Constitutional:  Positive for appetite change. Negative for fatigue and fever.  Respiratory:  Negative for chest tightness, shortness of breath and wheezing.   Cardiovascular:  Negative for chest pain.  Gastrointestinal:  Negative for abdominal pain, nausea and vomiting.  Psychiatric/Behavioral:  Positive for agitation, depression, dysphoric mood and sleep disturbance. Negative for suicidal ideas. The patient has insomnia. The patient is not nervous/anxious.     Patient Active Problem List   Diagnosis Date Noted   Migraine without aura and without status migrainosus, not intractable 01/11/2021    Swelling of limb 11/29/2019   Lymphedema 11/29/2019   Noncompliance 03/06/2019   Class 1 obesity due to excess calories without serious comorbidity with body mass index (BMI) of 30.0 to 30.9 in adult 02/11/2017   History of cervical dysplasia 02/11/2017   Liver lesion, right lobe 10/17/2016   Von Willebrand's disease (HCC) 11/10/2015   PCOS (polycystic ovarian syndrome) 09/20/2015    Allergies  Allergen Reactions   Salmon [Fish Oil]    Amoxicillin Rash   Percocet [Oxycodone-Acetaminophen] Nausea And Vomiting and Rash    Past Surgical History:  Procedure Laterality Date   BREAST REDUCTION SURGERY  2004   LOWER EXTREMITY VENOGRAPHY Left 09/24/2020   Procedure: LOWER EXTREMITY VENOGRAPHY;  Surgeon: Annice Needy, MD;  Location: [facility] INVASIVE CV LAB;  Service: Cardiovascular;  Laterality: Left;   OVARIAN CYST REMOVAL     REDUCTION MAMMAPLASTY     TONSILLECTOMY      Social History   Tobacco Use   Smoking status: Never   Smokeless tobacco: Never  Vaping Use   Vaping Use: Never used  Substance Use Topics   Alcohol use: Yes    Comment: rare   Drug use: No     Medication list has been reviewed and updated.  Current Meds  Medication Sig   mirtazapine (REMERON) 30 MG tablet Take 1 tablet (30 mg total) by mouth at bedtime.  10/01/2022    3:30 PM 03/06/2022   11:21 AM 12/26/2021   11:25 AM 01/11/2021    2:01 PM  GAD 7 : Generalized Anxiety Score  Nervous, Anxious, on Edge 1 1 0 3  Control/stop worrying 2 1 0 3  Worry too much - different things 1 2 0 3  Trouble relaxing 3 1 0 3  Restless 0 0 0 2  Easily annoyed or irritable 3 2 0 3  Afraid - awful might happen 0 1 0 3  Total GAD 7 Score 10 8 0 20  Anxiety Difficulty Somewhat difficult Not difficult at all  Not difficult at all       10/01/2022    3:28 PM 03/06/2022   11:21 AM 12/26/2021   11:25 AM  Depression screen PHQ 2/9  Decreased Interest 3 1 0  Down, Depressed, Hopeless 1 0 0  PHQ - 2 Score 4 1 0   Altered sleeping 3 1 2   Tired, decreased energy 1 1 0  Change in appetite 3 1 1   Feeling bad or failure about yourself  0 0 0  Trouble concentrating 1 0 0  Moving slowly or fidgety/restless 2 0 0  Suicidal thoughts 0 0 0  PHQ-9 Score 14 4 3   Difficult doing work/chores Extremely dIfficult Not difficult at all     BP Readings from Last 3 Encounters:  10/01/22 124/80  09/10/22 125/81  08/13/22 118/83    Physical Exam Constitutional:      General: She is irritable.     Appearance: Normal appearance.  Cardiovascular:     Rate and Rhythm: Normal rate and regular rhythm.  Pulmonary:     Effort: Pulmonary effort is normal.     Breath sounds: No wheezing or rhonchi.  Neurological:     Mental Status: She is alert.  Psychiatric:        Attention and Perception: Attention normal.        Mood and Affect: Affect is tearful.        Speech: Speech normal.        Behavior: Behavior normal.        Thought Content: Thought content does not include suicidal ideation. Thought content does not include suicidal plan.     Wt Readings from Last 3 Encounters:  10/01/22 217 lb (98.4 kg)  09/10/22 215 lb 3.2 oz (97.6 kg)  08/13/22 218 lb 11.2 oz (99.2 kg)    BP 124/80   Pulse 82   Ht 5\' 1"  (1.549 m)   Wt 217 lb (98.4 kg)   LMP 08/15/2022 (Exact Date)   SpO2 97%   BMI 41.00 kg/m   Assessment and Plan: 1. Severe recurrent major depression without psychotic features ([place]) Ongoing depression treated with Remeron 30 mg but worse since August. Will increase the dose of Remeron to help with sleep and appetite until she sees psych. - mirtazapine (REMERON) 45 MG tablet; Take 1 tablet (45 mg total) by mouth at bedtime.  Dispense: 30 tablet; Refill: 1  2. Grief reaction with prolonged bereavement Continue counseling. She has an appointment with [place] in November.  3. Von Willebrand's disease [facility]) She has not had a hematology evaluation recently and would like to be seen - having some  issues with OCPs and does not want an IUD that is being recommended by GYN. - Ambulatory referral to Hematology / Oncology   Partially dictated using Dragon software. Any errors are unintentional.  Halina Maidens, MD [facility]  10/01/2022

## 2022-10-14 ENCOUNTER — Inpatient Hospital Stay: Payer: BC Managed Care – PPO | Attending: Internal Medicine | Admitting: Internal Medicine

## 2022-10-14 ENCOUNTER — Encounter: Payer: Self-pay | Admitting: Internal Medicine

## 2022-10-14 ENCOUNTER — Other Ambulatory Visit: Payer: Self-pay

## 2022-10-14 ENCOUNTER — Ambulatory Visit: Payer: BC Managed Care – PPO | Admitting: Obstetrics and Gynecology

## 2022-10-14 ENCOUNTER — Inpatient Hospital Stay: Payer: BC Managed Care – PPO

## 2022-10-14 ENCOUNTER — Encounter: Payer: Self-pay | Admitting: Obstetrics and Gynecology

## 2022-10-14 VITALS — BP 112/89 | HR 90 | Resp 16 | Ht 61.0 in | Wt 220.0 lb

## 2022-10-14 VITALS — BP 113/65 | HR 100 | Temp 98.7°F | Resp 20 | Wt 220.2 lb

## 2022-10-14 DIAGNOSIS — N921 Excessive and frequent menstruation with irregular cycle: Secondary | ICD-10-CM

## 2022-10-14 DIAGNOSIS — Z833 Family history of diabetes mellitus: Secondary | ICD-10-CM | POA: Diagnosis not present

## 2022-10-14 DIAGNOSIS — Z885 Allergy status to narcotic agent status: Secondary | ICD-10-CM | POA: Insufficient documentation

## 2022-10-14 DIAGNOSIS — Z880 Allergy status to penicillin: Secondary | ICD-10-CM | POA: Insufficient documentation

## 2022-10-14 DIAGNOSIS — Z90721 Acquired absence of ovaries, unilateral: Secondary | ICD-10-CM | POA: Insufficient documentation

## 2022-10-14 DIAGNOSIS — E282 Polycystic ovarian syndrome: Secondary | ICD-10-CM | POA: Insufficient documentation

## 2022-10-14 DIAGNOSIS — Z803 Family history of malignant neoplasm of breast: Secondary | ICD-10-CM | POA: Diagnosis not present

## 2022-10-14 DIAGNOSIS — Z793 Long term (current) use of hormonal contraceptives: Secondary | ICD-10-CM | POA: Diagnosis not present

## 2022-10-14 DIAGNOSIS — N6320 Unspecified lump in the left breast, unspecified quadrant: Secondary | ICD-10-CM | POA: Diagnosis not present

## 2022-10-14 DIAGNOSIS — D6800 Von Willebrand disease, unspecified: Secondary | ICD-10-CM

## 2022-10-14 DIAGNOSIS — R9230 Dense breasts, unspecified: Secondary | ICD-10-CM | POA: Insufficient documentation

## 2022-10-14 DIAGNOSIS — N926 Irregular menstruation, unspecified: Secondary | ICD-10-CM | POA: Diagnosis not present

## 2022-10-14 DIAGNOSIS — N920 Excessive and frequent menstruation with regular cycle: Secondary | ICD-10-CM | POA: Insufficient documentation

## 2022-10-14 DIAGNOSIS — N63 Unspecified lump in unspecified breast: Secondary | ICD-10-CM | POA: Diagnosis not present

## 2022-10-14 DIAGNOSIS — Z8249 Family history of ischemic heart disease and other diseases of the circulatory system: Secondary | ICD-10-CM | POA: Insufficient documentation

## 2022-10-14 LAB — CBC WITH DIFFERENTIAL/PLATELET
Abs Immature Granulocytes: 0.04 10*3/uL (ref 0.00–0.07)
Basophils Absolute: 0 10*3/uL (ref 0.0–0.1)
Basophils Relative: 0 %
Eosinophils Absolute: 0.2 10*3/uL (ref 0.0–0.5)
Eosinophils Relative: 2 %
HCT: 36.6 % (ref 36.0–46.0)
Hemoglobin: 12.2 g/dL (ref 12.0–15.0)
Immature Granulocytes: 0 %
Lymphocytes Relative: 23 %
Lymphs Abs: 2.5 10*3/uL (ref 0.7–4.0)
MCH: 28.5 pg (ref 26.0–34.0)
MCHC: 33.3 g/dL (ref 30.0–36.0)
MCV: 85.5 fL (ref 80.0–100.0)
Monocytes Absolute: 0.5 10*3/uL (ref 0.1–1.0)
Monocytes Relative: 5 %
Neutro Abs: 7.7 10*3/uL (ref 1.7–7.7)
Neutrophils Relative %: 70 %
Platelets: 306 10*3/uL (ref 150–400)
RBC: 4.28 MIL/uL (ref 3.87–5.11)
RDW: 13.9 % (ref 11.5–15.5)
WBC: 11 10*3/uL — ABNORMAL HIGH (ref 4.0–10.5)
nRBC: 0 % (ref 0.0–0.2)

## 2022-10-14 LAB — APTT: aPTT: 35 seconds (ref 24–36)

## 2022-10-14 LAB — PROTIME-INR
INR: 1.2 (ref 0.8–1.2)
Prothrombin Time: 15.5 seconds — ABNORMAL HIGH (ref 11.4–15.2)

## 2022-10-14 MED ORDER — MEDROXYPROGESTERONE ACETATE 10 MG PO TABS: 10.0000 mg | ORAL_TABLET | Freq: Every day | ORAL | 2 refills | Status: DC

## 2022-10-14 NOTE — Progress Notes (Signed)
[facility]  [phone] [phone]  ID: Mallory Johnston OB: 02-08-1991  MR#: [id number]  IWL#:[id number]  Patient Care Team: Glean Hess, MD as PCP - General (Internal Medicine) Rubie Maid, MD as Referring Physician (Obstetrics and Gynecology)  REFERRING PROVIDER: Dr. Army Melia  REASON FOR REFERRAL: hx of vWD  HPI: Mallory Johnston is a 31 y.o. female with past medical history of PCOS with surgical removal of left ovary, ? Von Willebrand disease last seen hematology in 2009 was referred to hematology for prior history of von Willebrand disease and heavy menstrual period.  Per patient, she was diagnosed with von Willebrand disease in [place] in 2009 when she was planned for ovary removal due to large cyst.  She does not clearly remember the events but thinks might have received vitamin K and some plasma infusion perioperatively. She denies any other surgeries or procedures.  She does have heavy irregular menstrual period since menses.  Has diagnosis of PCOS and has tried different OCPs with no improvement.  Recently she was taken off OCPs due to side effects.  Denies any family history of abnormal bleeding.  Her sister was tested recently due to her pregnancy and her von Willebrand levels were normal.    She had 2 miscarriages in August 2015 and July 2021.  Denies any history of blood clots.   REVIEW OF SYSTEMS:   ROS  As per HPI. Otherwise, a complete review of systems is negative.  PAST MEDICAL HISTORY: Past Medical History:  Diagnosis Date   H/O bilateral breast reduction surgery    History of left oophorectomy 2009   due to ovarian cyst   Lymphadenopathy 2009   left leg   Lymphedema of left leg    S/P tonsillectomy    Suicidal ideation 03/06/2019   Von Willebrand disease ([place]) 2009    PAST SURGICAL HISTORY: Past Surgical History:  Procedure Laterality Date   BREAST REDUCTION SURGERY  2004   LOWER EXTREMITY VENOGRAPHY Left  09/24/2020   Procedure: LOWER EXTREMITY VENOGRAPHY;  Surgeon: Algernon Huxley, MD;  Location: [facility];  Service: Cardiovascular;  Laterality: Left;   OVARIAN CYST REMOVAL     REDUCTION MAMMAPLASTY     TONSILLECTOMY      FAMILY HISTORY: Family History  Problem Relation Age of Onset   Hypertension Father    Diabetes Mother    Breast cancer Neg Hx    Ovarian cancer Neg Hx    Colon cancer Neg Hx     HEALTH MAINTENANCE: Social History   Tobacco Use   Smoking status: Never   Smokeless tobacco: Never  Vaping Use   Vaping Use: Never used  Substance Use Topics   Alcohol use: Yes    Comment: rare   Drug use: No     Allergies  Allergen Reactions   Salmon [Fish Oil]    Amoxicillin Rash   Percocet [Oxycodone-Acetaminophen] Nausea And Vomiting and Rash    Current Outpatient Medications  Medication Sig Dispense Refill   medroxyPROGESTERone (PROVERA) 10 MG tablet Take 1 tablet (10 mg total) by mouth daily. Use for ten days. Take every 3 months to induce menses 10 tablet 2   mirtazapine (REMERON) 45 MG tablet Take 1 tablet (45 mg total) by mouth at bedtime. 30 tablet 1   No current facility-administered medications for this visit.    OBJECTIVE: There were no vitals filed for this visit.   Body mass index is 41.61 kg/m.  General: Well-developed, well-nourished, no acute distress. Eyes: Pink conjunctiva, anicteric sclera. HEENT: Normocephalic, moist mucous membranes, clear oropharnyx. Lungs: Clear to auscultation bilaterally. Heart: Regular rate and rhythm. No rubs, murmurs, or gallops. Abdomen: Soft, nontender, nondistended. No organomegaly noted, normoactive bowel sounds. Musculoskeletal: No edema, cyanosis, or clubbing. Neuro: Alert, answering all questions appropriately. Cranial nerves grossly intact. Skin: No rashes or petechiae noted. Psych: Normal affect. Lymphatics: No cervical, calvicular, axillary or inguinal LAD.   LAB RESULTS:  Lab Results   Component Value Date   NA 139 03/05/2019   K 3.4 (L) 03/05/2019   CL 106 03/05/2019   CO2 25 03/05/2019   GLUCOSE 96 03/05/2019   BUN 9 09/24/2020   CREATININE 0.74 09/24/2020   CALCIUM 9.2 03/05/2019   PROT 9.0 (H) 03/05/2019   ALBUMIN 4.5 03/05/2019   AST 21 03/05/2019   ALT 15 03/05/2019   ALKPHOS 65 03/05/2019   BILITOT 0.5 03/05/2019   GFRNONAA >60 09/24/2020   GFRAA >60 09/24/2020    Lab Results  Component Value Date   WBC 10.5 03/05/2019   NEUTROABS 9.0 (H) 01/20/2019   HGB 12.7 03/05/2019   HCT 39.9 03/05/2019   MCV 90.7 03/05/2019   PLT 336 03/05/2019    No results found for: "TIBC", "FERRITIN", "IRONPCTSAT"   STUDIES: MM DIAG BREAST TOMO BILATERAL  Result Date: 09/29/2022 CLINICAL DATA:  Sensation of LEFT breast swelling since August; has been changing birth control regimen. History breast reduction. Strong family history breast cancer in her mother. EXAM: DIGITAL DIAGNOSTIC BILATERAL MAMMOGRAM WITH TOMOSYNTHESIS; ULTRASOUND LEFT BREAST LIMITED TECHNIQUE: Bilateral digital diagnostic mammography and breast tomosynthesis was performed.; Targeted ultrasound examination of the left breast was performed. COMPARISON:  Previous exam(s). ACR Breast Density Category b: There are scattered areas of fibroglandular density. FINDINGS: Diagnostic images were obtained of the LEFT breast. No suspicious mammographic findings are noted. Stable post reduction changes are noted. No suspicious mass, distortion, or microcalcifications are identified to suggest presence of malignancy bilaterally. On physical exam, no suspicious mass is appreciated. Targeted ultrasound was performed of the LEFT axilla and outer breast. No suspicious cystic or solid mass is seen. No suspicious skin thickening is noted. Benign axillary lymph nodes are noted. IMPRESSION: 1. No suspicious mammographic or sonographic etiology for sensation of LEFT breast swelling is identified. Any further workup of the patient's  symptoms should be based on the clinical assessment. 2. No mammographic evidence of malignancy bilaterally. RECOMMENDATION: Recommend evaluation for candidacy for annual high risk screening protocol given possible family history of breast cancer at a relatively young age. If patient is of elevated lifetime risk of breast cancer, then recommend initiation of annual screening mammography and MRI. If patient is of average lifetime risk of breast cancer, then recommend initiation of annual screening mammography at the age of 36 unless there are intervening clinical concerns. I have discussed the findings and recommendations with the patient. If applicable, a reminder letter will be sent to the patient regarding the next appointment. BI-RADS CATEGORY  2: Benign. Electronically Signed   By: Meda Klinefelter M.D.   On: 09/29/2022 12:48  US BREAST LTD UNI LEFT INC AXILLA  Result Date: 09/29/2022 CLINICAL DATA:  Sensation of LEFT breast swelling since August; has been changing birth control regimen. History breast reduction. Strong family history breast cancer in her mother. EXAM: DIGITAL DIAGNOSTIC BILATERAL MAMMOGRAM WITH TOMOSYNTHESIS; ULTRASOUND LEFT BREAST LIMITED TECHNIQUE: Bilateral digital diagnostic mammography and breast tomosynthesis was performed.; Targeted ultrasound examination of the left breast was performed.  COMPARISON:  Previous exam(s). ACR Breast Density Category b: There are scattered areas of fibroglandular density. FINDINGS: Diagnostic images were obtained of the LEFT breast. No suspicious mammographic findings are noted. Stable post reduction changes are noted. No suspicious mass, distortion, or microcalcifications are identified to suggest presence of malignancy bilaterally. On physical exam, no suspicious mass is appreciated. Targeted ultrasound was performed of the LEFT axilla and outer breast. No suspicious cystic or solid mass is seen. No suspicious skin thickening is noted. Benign axillary  lymph nodes are noted. IMPRESSION: 1. No suspicious mammographic or sonographic etiology for sensation of LEFT breast swelling is identified. Any further workup of the patient's symptoms should be based on the clinical assessment. 2. No mammographic evidence of malignancy bilaterally. RECOMMENDATION: Recommend evaluation for candidacy for annual high risk screening protocol given possible family history of breast cancer at a relatively young age. If patient is of elevated lifetime risk of breast cancer, then recommend initiation of annual screening mammography and MRI. If patient is of average lifetime risk of breast cancer, then recommend initiation of annual screening mammography at the age of 79 unless there are intervening clinical concerns. I have discussed the findings and recommendations with the patient. If applicable, a reminder letter will be sent to the patient regarding the next appointment. BI-RADS CATEGORY  2: Benign. Electronically Signed   By: Meda Klinefelter M.D.   On: 09/29/2022 12:48   ASSESSMENT AND PLAN:   Mallory Johnston is a 31 y.o. female with pmh of of PCOS with surgical removal of left ovary, ? Von Willebrand disease last seen hematology in 2009 was referred to hematology for prior history of von Willebrand disease and heavy menstrual period.  #History of von Willebrand disease #Metromenorrhagia #PCOS  - Per patient, she was diagnosed with von Willebrand disease in [place] in 2009 when she was planned for ovary removal due to large cyst.  She does not clearly remember the events but thinks might have received vitamin K and some plasma infusion perioperatively. She denies any other surgeries or procedures.  She does have heavy irregular menstrual period since menses.  Has history of PCOS.  Has been following with Dr. Valentino Saxon.  Has tried OCPs in the past with not much improvement.  Denies any family history of excessive abnormal bleeding.  - will obtain von Willebrand panel.   She is blood group A+. Orders Placed This Encounter  Procedures   Von Willebrand panel   APTT   Protime-INR   CBC with Differential   Von Willebrand multimeric   RTC in 2 weeks for MD visit to discuss labs.  Patient expressed understanding and was in agreement with this plan. She also understands that She can call clinic at any time with any questions, concerns, or complaints.   I spent a total of 45 minutes reviewing chart data, face-to-face evaluation with the patient, counseling and coordination of care as detailed above.  Michaelyn Barter, MD   10/14/2022 2:25 PM

## 2022-10-14 NOTE — Progress Notes (Signed)
    GYNECOLOGY PROGRESS NOTE  Subjective:    Patient ID: Mallory Johnston, female    DOB: February 23, 1991, 31 y.o.   MRN: [id number]  HPI  Patient is a 31 y.o. [id number] female who presents for follow up on PCOS with irregular cycles and menorrhagia. She stopped taking the [facility] because she thought is was causing swelling in her breast (had unilateral left-sided breast swelling, work up negative).  Notes that swelling has decreased slightly but not a significant difference.  Reports her last cycle was August 18th.  Patient also questions regarding use of playing over-the-counter supplements to help balance her hormones.  Notes that her mother took a particular plan after her hysterectomy and felt like it helped her.  Lastly, patient reports that she will be seeing a hematologist next month to follow-up her von Willebrand's.  Has not seen one since 2009.    The following portions of the patient's history were reviewed and updated as appropriate: allergies, current medications, past family history, past medical history, past social history, past surgical history, and problem list.  Review of Systems Pertinent items noted in HPI and remainder of comprehensive ROS otherwise negative.   Objective:   Blood pressure 112/89, pulse 90, resp. rate 16, height 5\' 1"  (1.549 m), weight 220 lb (99.8 kg), last menstrual period 08/15/2022.  Body mass index is 41.57 kg/m. General appearance: alert and no distress Remainder of exam deferred  Assessment:   1. PCOS (polycystic ovarian syndrome)   2. Irregular menses   3. Breast swelling      Plan:   Discussed alternative management options for patient's irregular cycles and PCOS.  Advised on alternative progesterone only methods that could help with her irregular menses including Micronor, IUD insertion.  Irregular menses, discussed that we can trial Provera for withdrawal bleeding every 3 months and allow her body time to have the Slynd cleared from her  system.  Will prescribe.  After this time, if breast swelling improves, can resume a different birth control option.  In the meantime, patient can utilize condoms. Breast swelling, unilateral with no clear cause.  We will continue to hold on birth control for now.  Also discussed other options including use of danazol to help with breast swelling if no further improvement is noted.  RTC in 2 months to follow up after Hematology appointment and further discuss contraceptive options for management of PCOS and irregular cycles.    Rubie Maid, MD [facility]

## 2022-10-16 ENCOUNTER — Other Ambulatory Visit: Payer: Self-pay | Admitting: *Deleted

## 2022-10-16 DIAGNOSIS — D6800 Von Willebrand disease, unspecified: Secondary | ICD-10-CM

## 2022-10-16 LAB — VON WILLEBRAND PANEL
Coagulation Factor VIII: 111 % (ref 56–140)
Ristocetin Co-factor, Plasma: 42 % — ABNORMAL LOW (ref 50–200)
Von Willebrand Antigen, Plasma: 97 % (ref 50–200)

## 2022-10-16 LAB — COAG STUDIES INTERP REPORT

## 2022-10-20 ENCOUNTER — Inpatient Hospital Stay: Payer: BC Managed Care – PPO

## 2022-10-20 DIAGNOSIS — D6800 Von Willebrand disease, unspecified: Secondary | ICD-10-CM | POA: Diagnosis not present

## 2022-10-23 ENCOUNTER — Other Ambulatory Visit: Payer: Self-pay | Admitting: Internal Medicine

## 2022-10-23 DIAGNOSIS — F332 Major depressive disorder, recurrent severe without psychotic features: Secondary | ICD-10-CM

## 2022-10-24 NOTE — Telephone Encounter (Signed)
Requested medication (s) are due for refill today: yes  Requested medication (s) are on the active medication list: yes  Last refill:  10/01/22 #30 1 refills  Future visit scheduled: no  Notes to clinic:  Pharmacy comment: REQUEST FOR 90 DAYS PRESCRIPTION. DX Code Needed     Requested Prescriptions  Pending Prescriptions Disp Refills   mirtazapine (REMERON) 45 MG tablet [Pharmacy Med Name: MIRTAZAPINE 45 MG TABLET] 90 tablet 1    Sig: TAKE 1 TABLET BY MOUTH AT BEDTIME.     Psychiatry: Antidepressants - mirtazapine Passed - 10/23/2022  1:32 PM      Passed - Valid encounter within last 6 months    Recent Outpatient Visits           3 weeks ago Severe recurrent major depression without psychotic features [facility])   [facility], Jesse Sans, MD   7 months ago Gastroenteritis   [facility], Jesse Sans, MD   10 months ago COVID-19 virus infection   [facility] at [facility], Jesse Sans, MD   1 year ago [facility] at [facility], Jesse Sans, MD   2 years ago Epigastric pain   [facility] at [facility], Jesse Sans, MD

## 2022-10-27 ENCOUNTER — Encounter (INDEPENDENT_AMBULATORY_CARE_PROVIDER_SITE_OTHER): Payer: Self-pay

## 2022-10-27 ENCOUNTER — Ambulatory Visit
Admission: EM | Admit: 2022-10-27 | Discharge: 2022-10-27 | Disposition: A | Discharge status: Home / Self Care | Payer: BC Managed Care – PPO | Attending: Emergency Medicine | Admitting: Emergency Medicine

## 2022-10-27 DIAGNOSIS — J069 Acute upper respiratory infection, unspecified: Secondary | ICD-10-CM | POA: Diagnosis not present

## 2022-10-27 DIAGNOSIS — H66001 Acute suppurative otitis media without spontaneous rupture of ear drum, right ear: Secondary | ICD-10-CM

## 2022-10-27 DIAGNOSIS — R051 Acute cough: Secondary | ICD-10-CM | POA: Diagnosis not present

## 2022-10-27 MED ORDER — IPRATROPIUM BROMIDE 0.06 % NA SOLN: 2.0000 | Freq: Four times a day (QID) | NASAL | 12 refills | Status: DC

## 2022-10-27 MED ORDER — BENZONATATE 100 MG PO CAPS: 200.0000 mg | ORAL_CAPSULE | Freq: Three times a day (TID) | ORAL | 0 refills | Status: DC

## 2022-10-27 MED ORDER — CEFDINIR 300 MG PO CAPS: 300.0000 mg | ORAL_CAPSULE | Freq: Two times a day (BID) | ORAL | 0 refills | Status: AC

## 2022-10-27 MED ORDER — PROMETHAZINE-DM 6.25-15 MG/5ML PO SYRP: 5.0000 mL | ORAL_SOLUTION | Freq: Four times a day (QID) | ORAL | 0 refills | Status: DC | PRN

## 2022-10-27 NOTE — ED Provider Notes (Signed)
[facility]    CSN: [id number] Arrival date & time: 10/27/22  1157      History   Chief Complaint Chief Complaint  Patient presents with   Otalgia    RT ear   Cough    HPI Mallory Johnston is a 31 y.o. female.   HPI  31 year old female here for evaluation of sore throat and ear pain.  Patient reports that her sore throat began 6 days ago and her right ear pain began 2 days ago.  She is also complaining of some nasal congestion and occasional nonproductive cough.  She states that the hearing in her right ear is muffled.  She denies any fever, runny nose, drainage from her ear, shortness of breath, or wheezing.  Past Medical History:  Diagnosis Date   H/O bilateral breast reduction surgery    History of left oophorectomy 2009   due to ovarian cyst   Lymphadenopathy 2009   left leg   Lymphedema of left leg    S/P tonsillectomy    Suicidal ideation 03/06/2019   Von Willebrand disease ([facility]) 2009    Patient Active Problem List   Diagnosis Date Noted   Heavy menstrual period 10/14/2022   Migraine without aura and without status migrainosus, not intractable 01/11/2021   Swelling of limb 11/29/2019   Lymphedema 11/29/2019   Noncompliance 03/06/2019   Class 1 obesity due to excess calories without serious comorbidity with body mass index (BMI) of 30.0 to 30.9 in adult 02/11/2017   History of cervical dysplasia 02/11/2017   Liver lesion, right lobe 10/17/2016   Von Willebrand's disease ([facility]) 11/10/2015   PCOS (polycystic ovarian syndrome) 09/20/2015    Past Surgical History:  Procedure Laterality Date   BREAST REDUCTION SURGERY  2004   LOWER EXTREMITY VENOGRAPHY Left 09/24/2020   Procedure: LOWER EXTREMITY VENOGRAPHY;  Surgeon: Annice Needy, MD;  Location: [facility];  Service: Cardiovascular;  Laterality: Left;   OVARIAN CYST REMOVAL     REDUCTION MAMMAPLASTY     TONSILLECTOMY      OB History     Gravida  1   Para      Term       Preterm      AB  1   Living         SAB  1   IAB      Ectopic      Multiple      Live Births               Home Medications    Prior to Admission medications   Medication Sig Start Date End Date Taking? Authorizing Provider  benzonatate (TESSALON) 100 MG capsule Take 2 capsules (200 mg total) by mouth every 8 (eight) hours. 10/27/22  Yes Becky Augusta, NP  cefdinir (OMNICEF) 300 MG capsule Take 1 capsule (300 mg total) by mouth 2 (two) times daily for 10 days. 10/27/22 11/06/22 Yes Becky Augusta, NP  ipratropium (ATROVENT) 0.06 % nasal spray Place 2 sprays into both nostrils 4 (four) times daily. 10/27/22  Yes Becky Augusta, NP  mirtazapine (REMERON) 45 MG tablet Take 1 tablet (45 mg total) by mouth at bedtime. 10/01/22  Yes Reubin Milan, MD  promethazine-dextromethorphan (PROMETHAZINE-DM) 6.25-15 MG/5ML syrup Take 5 mLs by mouth 4 (four) times daily as needed. 10/27/22  Yes Becky Augusta, NP  medroxyPROGESTERone (PROVERA) 10 MG tablet Take 1 tablet (10 mg total) by mouth daily. Use for ten days. Take every 3  months to induce menses 10/14/22   Rubie Maid, MD    Family History Family History  Problem Relation Age of Onset   Hypertension Father    Diabetes Mother    Breast cancer Neg Hx    Ovarian cancer Neg Hx    Colon cancer Neg Hx     Social History Social History   Tobacco Use   Smoking status: Never   Smokeless tobacco: Never  Vaping Use   Vaping Use: Never used  Substance Use Topics   Alcohol use: Yes    Comment: rare   Drug use: No     Allergies   Salmon [fish oil], Amoxicillin, and Percocet [oxycodone-acetaminophen]   Review of Systems Review of Systems  Constitutional:  Negative for fever.  HENT:  Positive for congestion, ear pain and hearing loss. Negative for ear discharge, rhinorrhea and sore throat.   Respiratory:  Positive for cough. Negative for shortness of breath and wheezing.   Hematological: Negative.   Psychiatric/Behavioral:  Negative.       Physical Exam Triage Vital Signs ED Triage Vitals  Enc Vitals Group     BP 10/27/22 1304 124/88     Pulse Rate 10/27/22 1304 92     Resp --      Temp 10/27/22 1304 97.9 F (36.6 C)     Temp Source 10/27/22 1304 Oral     SpO2 10/27/22 1304 98 %     Weight 10/27/22 1303 218 lb (98.9 kg)     Height 10/27/22 1303 5\' 1"  (1.549 m)     Head Circumference --      Peak Flow --      Pain Score 10/27/22 1303 7     Pain Loc --      Pain Edu? --      Excl. in [place]? --    No data found.  Updated Vital Signs BP 124/88 (BP Location: Right Arm)   Pulse 92   Temp 97.9 F (36.6 C) (Oral)   Ht 5\' 1"  (1.549 m)   Wt 218 lb (98.9 kg)   LMP 08/14/2022 (Exact Date)   SpO2 98%   BMI 41.19 kg/m   Visual Acuity Right Eye Distance:   Left Eye Distance:   Bilateral Distance:    Right Eye Near:   Left Eye Near:    Bilateral Near:     Physical Exam   UC Treatments / Results  Labs (all labs ordered are listed, but only abnormal results are displayed) Labs Reviewed - No data to display  EKG   Radiology No results found.  Procedures Procedures (including critical care time)  Medications Ordered in UC Medications - No data to display  Initial Impression / Assessment and Plan / UC Course  I have reviewed the triage vital signs and the nursing notes.  Pertinent labs & imaging results that were available during my care of the patient were reviewed by me and considered in my medical decision making (see chart for details).   Patient is a pleasant, nontoxic-appearing 31 year old female here for evaluation of sore throat and right ear pain as outlined in HPI above.  On exam patient has erythematous and injected right tympanic membrane.  There is visible pus behind the TM but the TM is intact.  External auditory canal is clear.  She does have erythema and edema of her nasal mucosa but her discharge is clear.  Posterior oropharynx reveals 1+ erythematous edematous tonsillar  pillars but no exudate.  Posterior oropharynx  is erythematous injected with clear postnasal drip.  No cervical lymphadenopathy on exam.  Cardiopulmonary exam reveals clear lung sounds in all fields.  Her exam is consistent with upper respiratory infection and also otitis media of the right ear.  She has an allergy to penicillin which includes a malar rash.  Patient spoke with her mother and her mother indicates that the patient has never been on cefdinir to her knowledge.  Mom also reports that she to has an allergy to amoxicillin but can tolerate cephalosporins.  I will do a trial of cefdinir 300 mg twice daily for 10 days for treatment of the otitis media.  I will also prescribe Atrovent nasal spray, Tessalon Perles, and Promethazine DM cough syrup as needed for cough and congestion.  Return precautions reviewed.  Work note provided.   Final Clinical Impressions(s) / UC Diagnoses   Final diagnoses:  Upper respiratory tract infection, unspecified type  Non-recurrent acute suppurative otitis media of right ear without spontaneous rupture of tympanic membrane  Acute cough     Discharge Instructions      Take the Cefdinir twice daily for 10 days with food for treatment of your ear infection.  Take an over-the-counter probiotic 1 hour after each dose of antibiotic to prevent diarrhea.  Use over-the-counter Tylenol and ibuprofen as needed for pain or fever.  Place a hot water bottle, or heating pad, underneath your pillowcase at night to help dilate up your ear and aid in pain relief as well as resolution of the infection.  Use the Atrovent nasal spray, 2 squirts in each nostril every 6 hours, as needed for runny nose and postnasal drip.  Use the Tessalon Perles every 8 hours during the day.  Take them with a small sip of water.  They may give you some numbness to the base of your tongue or a metallic taste in your mouth, this is normal.  Use the Promethazine DM cough syrup at bedtime for  cough and congestion.  It will make you drowsy so do not take it during the day.  Return for reevaluation for any new or worsening symptoms.      ED Prescriptions     Medication Sig Dispense Auth. Provider   ipratropium (ATROVENT) 0.06 % nasal spray Place 2 sprays into both nostrils 4 (four) times daily. 15 mL Becky Augusta, NP   benzonatate (TESSALON) 100 MG capsule Take 2 capsules (200 mg total) by mouth every 8 (eight) hours. 21 capsule Becky Augusta, NP   promethazine-dextromethorphan (PROMETHAZINE-DM) 6.25-15 MG/5ML syrup Take 5 mLs by mouth 4 (four) times daily as needed. 118 mL Becky Augusta, NP   cefdinir (OMNICEF) 300 MG capsule Take 1 capsule (300 mg total) by mouth 2 (two) times daily for 10 days. 20 capsule Becky Augusta, NP      PDMP not reviewed this encounter.   Becky Augusta, NP 10/27/22 1348

## 2022-10-27 NOTE — ED Triage Notes (Signed)
Pt states she had a sore throat on Tuesday, RT ear ache onset Saturday, a little cough

## 2022-10-27 NOTE — Discharge Instructions (Signed)
Take the Cefdinir twice daily for 10 days with food for treatment of your ear infection.  Take an over-the-counter probiotic 1 hour after each dose of antibiotic to prevent diarrhea.  Use over-the-counter Tylenol and ibuprofen as needed for pain or fever.  Place a hot water bottle, or heating pad, underneath your pillowcase at night to help dilate up your ear and aid in pain relief as well as resolution of the infection.  Use the Atrovent nasal spray, 2 squirts in each nostril every 6 hours, as needed for runny nose and postnasal drip.  Use the Tessalon Perles every 8 hours during the day.  Take them with a small sip of water.  They may give you some numbness to the base of your tongue or a metallic taste in your mouth, this is normal.  Use the Promethazine DM cough syrup at bedtime for cough and congestion.  It will make you drowsy so do not take it during the day.  Return for reevaluation for any new or worsening symptoms.  

## 2022-10-28 ENCOUNTER — Inpatient Hospital Stay: Payer: BC Managed Care – PPO | Admitting: Internal Medicine

## 2022-11-02 ENCOUNTER — Telehealth: Payer: BC Managed Care – PPO | Admitting: Family

## 2022-11-02 DIAGNOSIS — R197 Diarrhea, unspecified: Secondary | ICD-10-CM | POA: Diagnosis not present

## 2022-11-02 DIAGNOSIS — H65191 Other acute nonsuppurative otitis media, right ear: Secondary | ICD-10-CM | POA: Diagnosis not present

## 2022-11-02 MED ORDER — FLUTICASONE PROPIONATE 50 MCG/ACT NA SUSP: 2.0000 | Freq: Every day | NASAL | 6 refills | Status: AC

## 2022-11-02 MED ORDER — CETIRIZINE HCL 10 MG PO TABS: 10.0000 mg | ORAL_TABLET | Freq: Every day | ORAL | 1 refills | Status: AC

## 2022-11-02 MED ORDER — DOXYCYCLINE HYCLATE 100 MG PO TABS: 100.0000 mg | ORAL_TABLET | Freq: Two times a day (BID) | ORAL | 0 refills | Status: DC

## 2022-11-02 NOTE — Patient Instructions (Signed)

## 2022-11-02 NOTE — Progress Notes (Signed)
Virtual Visit Consent   Mallory Johnston, you are scheduled for a virtual visit with a [facility] provider today. Just as with appointments in the office, your consent must be obtained to participate. Your consent will be active for this visit and any virtual visit you may have with one of our providers in the next 365 days. If you have a MyChart account, a copy of this consent can be sent to you electronically.  As this is a virtual visit, video technology does not allow for your provider to perform a traditional examination. This may limit your provider's ability to fully assess your condition. If your provider identifies any concerns that need to be evaluated in person or the need to arrange testing (such as labs, EKG, etc.), we will make arrangements to do so. Although advances in technology are sophisticated, we cannot ensure that it will always work on either your end or our end. If the connection with a video visit is poor, the visit may have to be switched to a telephone visit. With either a video or telephone visit, we are not always able to ensure that we have a secure connection.  By engaging in this virtual visit, you consent to the provision of healthcare and authorize for your insurance to be billed (if applicable) for the services provided during this visit. Depending on your insurance coverage, you may receive a charge related to this service.  I need to obtain your verbal consent now. Are you willing to proceed with your visit today? Mallory Johnston has provided verbal consent on 11/02/2022 for a virtual visit (video or telephone). Mallory Rodney, FNP  Date: 11/02/2022 3:52 PM  Virtual Visit via Video Note   I, Mallory Johnston, connected with  Mallory Johnston  ([id number], January 29, 1991) on 11/02/22 at  3:45 PM EST by a video-enabled telemedicine application and verified that I am speaking with the correct person using two identifiers.  Location: Patient: Virtual Visit Location Patient:  Home Provider: Virtual Visit Location Provider: Home Office   I discussed the limitations of evaluation and management by telemedicine and the availability of in person appointments. The patient expressed understanding and agreed to proceed.    History of Present Illness: Mallory Johnston is a 31 y.o. who identifies as a female who was assigned female at birth, and is being seen today for diarrhea since starting omnicef 300 mg. She started on this on 10/27/22 for right ear infection. She reports she continues to have ear pain and fullness. Reports mild aching pain of 5 out 10 and HOH. Reports she had a negative COVID test.   HPI: Otalgia  There is pain in the right ear. This is a recurrent problem. The current episode started 1 to 4 weeks ago. The problem has been waxing and waning. There has been no fever. The pain is at a severity of 5/10. The pain is moderate. Associated symptoms include diarrhea, hearing loss, rhinorrhea and a sore throat (improved). Pertinent negatives include no coughing or headaches. She has tried antibiotics and NSAIDs for the symptoms.    Problems:  Patient Active Problem List   Diagnosis Date Noted   Heavy menstrual period 10/14/2022   Migraine without aura and without status migrainosus, not intractable 01/11/2021   Swelling of limb 11/29/2019   Lymphedema 11/29/2019   Noncompliance 03/06/2019   Class 1 obesity due to excess calories without serious comorbidity with body mass index (BMI) of 30.0 to 30.9 in adult 02/11/2017   History  of cervical dysplasia 02/11/2017   Liver lesion, right lobe 10/17/2016   Von Willebrand's disease ([place]) 11/10/2015   PCOS (polycystic ovarian syndrome) 09/20/2015    Allergies:  Allergies  Allergen Reactions   Salmon [Fish Oil]    Amoxicillin Rash   Percocet [Oxycodone-Acetaminophen] Nausea And Vomiting and Rash   Medications:  Current Outpatient Medications:    cetirizine (ZYRTEC ALLERGY) 10 MG tablet, Take 1 tablet (10 mg  total) by mouth daily., Disp: 90 tablet, Rfl: 1   doxycycline (VIBRA-TABS) 100 MG tablet, Take 1 tablet (100 mg total) by mouth 2 (two) times daily., Disp: 20 tablet, Rfl: 0   fluticasone (FLONASE) 50 MCG/ACT nasal spray, Place 2 sprays into both nostrils daily., Disp: 16 g, Rfl: 6   benzonatate (TESSALON) 100 MG capsule, Take 2 capsules (200 mg total) by mouth every 8 (eight) hours., Disp: 21 capsule, Rfl: 0   cefdinir (OMNICEF) 300 MG capsule, Take 1 capsule (300 mg total) by mouth 2 (two) times daily for 10 days., Disp: 20 capsule, Rfl: 0   ipratropium (ATROVENT) 0.06 % nasal spray, Place 2 sprays into both nostrils 4 (four) times daily., Disp: 15 mL, Rfl: 12   medroxyPROGESTERone (PROVERA) 10 MG tablet, Take 1 tablet (10 mg total) by mouth daily. Use for ten days. Take every 3 months to induce menses, Disp: 10 tablet, Rfl: 2   mirtazapine (REMERON) 45 MG tablet, Take 1 tablet (45 mg total) by mouth at bedtime., Disp: 30 tablet, Rfl: 1   promethazine-dextromethorphan (PROMETHAZINE-DM) 6.25-15 MG/5ML syrup, Take 5 mLs by mouth 4 (four) times daily as needed., Disp: 118 mL, Rfl: 0  Observations/Objective: Patient is well-developed, well-nourished in no acute distress.  Resting comfortably  at home.  Head is normocephalic, atraumatic.  No labored breathing.  Speech is clear and coherent with logical content.  Patient is alert and oriented at baseline.  Mild right ear pain with movement of ear lobe  Assessment and Plan: 1. Other acute nonsuppurative otitis media of right ear, recurrence not specified - doxycycline (VIBRA-TABS) 100 MG tablet; Take 1 tablet (100 mg total) by mouth 2 (two) times daily.  Dispense: 20 tablet; Refill: 0 - cetirizine (ZYRTEC ALLERGY) 10 MG tablet; Take 1 tablet (10 mg total) by mouth daily.  Dispense: 90 tablet; Refill: 1 - fluticasone (FLONASE) 50 MCG/ACT nasal spray; Place 2 sprays into both nostrils daily.  Dispense: 16 g; Refill: 6  2. Diarrhea, unspecified  type  Stop Omnicef Start doxycyline Start zyrtec, flonase, and nasal decongestant  Follow up if symptoms worsen or do not improve   Follow Up Instructions: I discussed the assessment and treatment plan with the patient. The patient was provided an opportunity to ask questions and all were answered. The patient agreed with the plan and demonstrated an understanding of the instructions.  A copy of instructions were sent to the patient via MyChart unless otherwise noted below.     The patient was advised to call back or seek an in-person evaluation if the symptoms worsen or if the condition fails to improve as anticipated.  Time:  I spent 12 minutes with the patient via telehealth technology discussing the above problems/concerns.    Evelina Dun, FNP

## 2022-11-07 ENCOUNTER — Encounter: Payer: Self-pay | Admitting: Internal Medicine

## 2022-11-07 ENCOUNTER — Ambulatory Visit (INDEPENDENT_AMBULATORY_CARE_PROVIDER_SITE_OTHER): Payer: BC Managed Care – PPO | Admitting: Internal Medicine

## 2022-11-07 VITALS — BP 118/76 | HR 114 | Temp 98.9°F | Ht 61.0 in | Wt 223.0 lb

## 2022-11-07 DIAGNOSIS — H6991 Unspecified Eustachian tube disorder, right ear: Secondary | ICD-10-CM

## 2022-11-07 DIAGNOSIS — J0100 Acute maxillary sinusitis, unspecified: Secondary | ICD-10-CM | POA: Diagnosis not present

## 2022-11-07 LAB — VON WILLEBRAND FACTOR MULTIMER

## 2022-11-07 MED ORDER — PREDNISONE 10 MG PO TABS: 10.0000 mg | ORAL_TABLET | ORAL | 0 refills | Status: AC

## 2022-11-07 NOTE — Progress Notes (Signed)
o   Date:  11/07/2022   Name:  Mallory Johnston   DOB:  19-Dec-1991   MRN:  [id number]   Chief Complaint: Ear Pain (URI first then Ear infection went to UC and did VV was prescribed medication it did not work )  Otalgia  There is pain in the right ear. This is a recurrent problem. Episode onset: X2 weeks. The problem has been unchanged. There has been no fever. The pain is at a severity of 5/10. The pain is mild. Associated symptoms include coughing, diarrhea (resolved), hearing loss and vomiting. Pertinent negatives include no sore throat. She has tried antibiotics for the symptoms. The treatment provided mild relief.  She has 5 more days of Doxy, is taking Flonase and Zyrtec. Ceftin caused diarrhea. She has minimal ear pain but can not heal well at all on the right.  Lab Results  Component Value Date   NA 139 03/05/2019   K 3.4 (L) 03/05/2019   CO2 25 03/05/2019   GLUCOSE 96 03/05/2019   BUN 9 09/24/2020   CREATININE 0.74 09/24/2020   CALCIUM 9.2 03/05/2019   GFRNONAA >60 09/24/2020   Lab Results  Component Value Date   CHOL 148 03/08/2019   HDL 52 03/08/2019   LDLCALC 73 03/08/2019   TRIG 115 03/08/2019   CHOLHDL 2.8 03/08/2019   Lab Results  Component Value Date   TSH 1.201 03/08/2019   Lab Results  Component Value Date   HGBA1C 5.3 03/08/2019   Lab Results  Component Value Date   WBC 11.0 (H) 10/14/2022   HGB 12.2 10/14/2022   HCT 36.6 10/14/2022   MCV 85.5 10/14/2022   PLT 306 10/14/2022   Lab Results  Component Value Date   ALT 15 03/05/2019   AST 21 03/05/2019   ALKPHOS 65 03/05/2019   BILITOT 0.5 03/05/2019   No results found for: "25OHVITD2", "25OHVITD3", "VD25OH"   Review of Systems  Constitutional:  Negative for chills, fatigue and fever.  HENT:  Positive for congestion, ear pain and hearing loss. Negative for postnasal drip, sore throat and trouble swallowing.   Respiratory:  Positive for cough. Negative for chest tightness, shortness of breath  and wheezing.   Cardiovascular:  Negative for chest pain.  Gastrointestinal:  Positive for diarrhea (resolved) and vomiting.    Patient Active Problem List   Diagnosis Date Noted   Heavy menstrual period 10/14/2022   Migraine without aura and without status migrainosus, not intractable 01/11/2021   Swelling of limb 11/29/2019   Lymphedema 11/29/2019   Noncompliance 03/06/2019   Class 1 obesity due to excess calories without serious comorbidity with body mass index (BMI) of 30.0 to 30.9 in adult 02/11/2017   History of cervical dysplasia 02/11/2017   Liver lesion, right lobe 10/17/2016   Von Willebrand's disease ([place]) 11/10/2015   PCOS (polycystic ovarian syndrome) 09/20/2015    Allergies  Allergen Reactions   Salmon [Fish Oil]    Amoxicillin Rash   Percocet [Oxycodone-Acetaminophen] Nausea And Vomiting and Rash    Past Surgical History:  Procedure Laterality Date   BREAST REDUCTION SURGERY  2004   LOWER EXTREMITY VENOGRAPHY Left 09/24/2020   Procedure: LOWER EXTREMITY VENOGRAPHY;  Surgeon: Algernon Huxley, MD;  Location: [facility];  Service: Cardiovascular;  Laterality: Left;   OVARIAN CYST REMOVAL     REDUCTION MAMMAPLASTY     TONSILLECTOMY      Social History   Tobacco Use   Smoking status: Never   Smokeless tobacco:  Never  Vaping Use   Vaping Use: Never used  Substance Use Topics   Alcohol use: Yes    Comment: rare   Drug use: No     Medication list has been reviewed and updated.  Current Meds  Medication Sig   cetirizine (ZYRTEC ALLERGY) 10 MG tablet Take 1 tablet (10 mg total) by mouth daily.   doxycycline (VIBRA-TABS) 100 MG tablet Take 1 tablet (100 mg total) by mouth 2 (two) times daily.   fluticasone (FLONASE) 50 MCG/ACT nasal spray Place 2 sprays into both nostrils daily.   ipratropium (ATROVENT) 0.06 % nasal spray Place 2 sprays into both nostrils 4 (four) times daily.   medroxyPROGESTERone (PROVERA) 10 MG tablet Take 1 tablet (10 mg total)  by mouth daily. Use for ten days. Take every 3 months to induce menses   mirtazapine (REMERON) 45 MG tablet Take 1 tablet (45 mg total) by mouth at bedtime.   predniSONE (DELTASONE) 10 MG tablet Take 1 tablet (10 mg total) by mouth as directed for 6 days. Take 6,5,4,3,2,1 then stop   promethazine-dextromethorphan (PROMETHAZINE-DM) 6.25-15 MG/5ML syrup Take 5 mLs by mouth 4 (four) times daily as needed.   [DISCONTINUED] benzonatate (TESSALON) 100 MG capsule Take 2 capsules (200 mg total) by mouth every 8 (eight) hours.       11/07/2022    2:48 PM 10/01/2022    3:30 PM 03/06/2022   11:21 AM 12/26/2021   11:25 AM  GAD 7 : Generalized Anxiety Score  Nervous, Anxious, on Edge 1 1 1  0  Control/stop worrying 1 2 1  0  Worry too much - different things 3 1 2  0  Trouble relaxing 2 3 1  0  Restless 2 0 0 0  Easily annoyed or irritable 2 3 2  0  Afraid - awful might happen 0 0 1 0  Total GAD 7 Score 11 10 8  0  Anxiety Difficulty Somewhat difficult Somewhat difficult Not difficult at all        11/07/2022    2:47 PM 10/14/2022    9:45 AM 10/01/2022    3:28 PM  Depression screen PHQ 2/9  Decreased Interest 1 2 3   Down, Depressed, Hopeless 1 0 1  PHQ - 2 Score 2 2 4   Altered sleeping 3  3  Tired, decreased energy 2  1  Change in appetite 2  3  Feeling bad or failure about yourself  0  0  Trouble concentrating 1  1  Moving slowly or fidgety/restless 0  2  Suicidal thoughts 0  0  PHQ-9 Score 10  14  Difficult doing work/chores Somewhat difficult  Extremely dIfficult    BP Readings from Last 3 Encounters:  11/07/22 118/76  10/27/22 124/88  10/14/22 113/65    Physical Exam Vitals and nursing note reviewed.  Constitutional:      General: She is not in acute distress.    Appearance: Normal appearance. She is well-developed.  HENT:     Head: Normocephalic and atraumatic.     Right Ear: Decreased hearing noted. Tympanic membrane is erythematous and retracted.     Left Ear: Hearing and  tympanic membrane normal. Tympanic membrane is not erythematous or retracted.     Nose:     Right Sinus: Maxillary sinus tenderness present.     Left Sinus: Maxillary sinus tenderness present.  Cardiovascular:     Rate and Rhythm: Normal rate and regular rhythm.  Pulmonary:     Effort: Pulmonary effort is normal. No respiratory  distress.     Breath sounds: Normal breath sounds.  Lymphadenopathy:     Cervical: No cervical adenopathy.  Skin:    General: Skin is warm and dry.     Findings: No rash.  Neurological:     Mental Status: She is alert and oriented to person, place, and time.  Psychiatric:        Mood and Affect: Mood normal.        Behavior: Behavior normal.     Wt Readings from Last 3 Encounters:  11/07/22 223 lb (101.2 kg)  10/27/22 218 lb (98.9 kg)  10/14/22 220 lb 3.2 oz (99.9 kg)    BP 118/76   Pulse (!) 114   Temp 98.9 F (37.2 C) (Oral)   Ht 5\' 1"  (1.549 m)   Wt 223 lb (101.2 kg)   LMP 11/01/2022 (Exact Date)   SpO2 97%   BMI 42.14 kg/m   Assessment and Plan: 1. Disorder of right eustachian tube Continue Zyrtec Add sudafed bid and steroid taper If hearing does not return to normal, will need to see ENT - predniSONE (DELTASONE) 10 MG tablet; Take 1 tablet (10 mg total) by mouth as directed for 6 days. Take 6,5,4,3,2,1 then stop  Dispense: 21 tablet; Refill: 0  2. Acute non-recurrent maxillary sinusitis Finish course of Doxycycline and Flonase NS   Partially dictated using Editor, commissioning. Any errors are unintentional.  Halina Maidens, MD [facility]  11/07/2022

## 2022-11-07 NOTE — Patient Instructions (Signed)
Take Sudafed 30 mg twice a day  Continue Zyrtec, Flonase and finish antibiotics.

## 2022-11-10 ENCOUNTER — Inpatient Hospital Stay: Payer: BC Managed Care – PPO | Attending: Internal Medicine | Admitting: Internal Medicine

## 2022-11-10 ENCOUNTER — Encounter: Payer: Self-pay | Admitting: Internal Medicine

## 2022-11-10 DIAGNOSIS — J029 Acute pharyngitis, unspecified: Secondary | ICD-10-CM | POA: Insufficient documentation

## 2022-11-10 DIAGNOSIS — Z8249 Family history of ischemic heart disease and other diseases of the circulatory system: Secondary | ICD-10-CM | POA: Insufficient documentation

## 2022-11-10 DIAGNOSIS — D6800 Von Willebrand disease, unspecified: Secondary | ICD-10-CM | POA: Insufficient documentation

## 2022-11-10 DIAGNOSIS — N921 Excessive and frequent menstruation with irregular cycle: Secondary | ICD-10-CM | POA: Diagnosis not present

## 2022-11-10 DIAGNOSIS — E282 Polycystic ovarian syndrome: Secondary | ICD-10-CM | POA: Insufficient documentation

## 2022-11-10 DIAGNOSIS — R791 Abnormal coagulation profile: Secondary | ICD-10-CM | POA: Insufficient documentation

## 2022-11-10 DIAGNOSIS — Z833 Family history of diabetes mellitus: Secondary | ICD-10-CM | POA: Insufficient documentation

## 2022-11-10 DIAGNOSIS — Z885 Allergy status to narcotic agent status: Secondary | ICD-10-CM | POA: Insufficient documentation

## 2022-11-10 DIAGNOSIS — Z793 Long term (current) use of hormonal contraceptives: Secondary | ICD-10-CM | POA: Insufficient documentation

## 2022-11-10 DIAGNOSIS — Z90721 Acquired absence of ovaries, unilateral: Secondary | ICD-10-CM | POA: Insufficient documentation

## 2022-11-10 DIAGNOSIS — Z79899 Other long term (current) drug therapy: Secondary | ICD-10-CM | POA: Diagnosis not present

## 2022-11-10 NOTE — Progress Notes (Signed)
[facility]  Telephone:([phone] [phone] I connected with Mallory Johnston on 11/10/22 at  3:45 PM EST by my chart video and verified that I am speaking with the correct person using two identifiers.   I discussed the limitations, risks, security and privacy concerns of performing an evaluation and management service by telemedicine and the availability of in-person appointments. I also discussed with the patient that there may be a patient responsible charge related to this service. The patient expressed understanding and agreed to proceed.   Other persons participating in the visit and their role in the encounter: NONE   Patient's location: car  Provider's location: clinic   Chief Complaint: discuss labs    ID: Mallory Johnston OB: [birth year]  MR#: [id number]  EGB#:[id number]  Patient Care Team: Reubin Milan, MD as PCP - General (Internal Medicine) Hildred Laser, MD as Referring Physician (Obstetrics and Gynecology)  REFERRING PROVIDER: Dr. Judithann Graves  REASON FOR REFERRAL: hx of vWD  HPI: Mallory Johnston is a 31 y.o. female with past medical history of PCOS with surgical removal of left ovary, ? Von Willebrand disease last seen hematology in 2009 was referred to hematology for prior history of von Willebrand disease and heavy menstrual period.  Per patient, she was diagnosed with von Willebrand disease in [place] in 2009 when she was planned for ovary removal due to large cyst.  She does not clearly remember the events but thinks might have received vitamin K and some plasma infusion perioperatively. She denies any other surgeries or procedures.  She does have heavy irregular menstrual period since menses.  Has diagnosis of PCOS and has tried different OCPs with no improvement.  Recently she was taken off OCPs due to side effects.  Denies any family history of abnormal bleeding.  Her sister was tested recently due to her pregnancy and her von  Willebrand levels were normal.    She had 2 miscarriages in August 2015 and July 2021.  Denies any history of blood clots.  INTERVAL HISTORY-  Patient seen today to discuss labs.. She recently presented to urgent care for sore throat and was diagnosed with respiratory infection.  She is feeling better now.  REVIEW OF SYSTEMS:   ROS  As per HPI. Otherwise, a complete review of systems is negative.  PAST MEDICAL HISTORY: Past Medical History:  Diagnosis Date   H/O bilateral breast reduction surgery    History of left oophorectomy 2009   due to ovarian cyst   Lymphadenopathy 2009   left leg   Lymphedema of left leg    S/P tonsillectomy    Suicidal ideation 03/06/2019   Von Willebrand disease ([facility]) 2009    PAST SURGICAL HISTORY: Past Surgical History:  Procedure Laterality Date   BREAST REDUCTION SURGERY  2004   LOWER EXTREMITY VENOGRAPHY Left 09/24/2020   Procedure: LOWER EXTREMITY VENOGRAPHY;  Surgeon: Annice Needy, MD;  Location: [facility];  Service: Cardiovascular;  Laterality: Left;   OVARIAN CYST REMOVAL     REDUCTION MAMMAPLASTY     TONSILLECTOMY      FAMILY HISTORY: Family History  Problem Relation Age of Onset   Hypertension Father    Diabetes Mother    Breast cancer Neg Hx    Ovarian cancer Neg Hx    Colon cancer Neg Hx     HEALTH MAINTENANCE: Social History   Tobacco Use   Smoking status: Never   Smokeless tobacco: Never  Vaping Use  Vaping Use: Never used  Substance Use Topics   Alcohol use: Yes    Comment: rare   Drug use: No     Allergies  Allergen Reactions   Salmon [Fish Oil]    Amoxicillin Rash   Percocet [Oxycodone-Acetaminophen] Nausea And Vomiting and Rash    Current Outpatient Medications  Medication Sig Dispense Refill   cetirizine (ZYRTEC ALLERGY) 10 MG tablet Take 1 tablet (10 mg total) by mouth daily. 90 tablet 1   doxycycline (VIBRA-TABS) 100 MG tablet Take 1 tablet (100 mg total) by mouth 2 (two) times daily.  20 tablet 0   fluticasone (FLONASE) 50 MCG/ACT nasal spray Place 2 sprays into both nostrils daily. 16 g 6   ipratropium (ATROVENT) 0.06 % nasal spray Place 2 sprays into both nostrils 4 (four) times daily. 15 mL 12   medroxyPROGESTERone (PROVERA) 10 MG tablet Take 1 tablet (10 mg total) by mouth daily. Use for ten days. Take every 3 months to induce menses 10 tablet 2   mirtazapine (REMERON) 45 MG tablet Take 1 tablet (45 mg total) by mouth at bedtime. 30 tablet 1   predniSONE (DELTASONE) 10 MG tablet Take 1 tablet (10 mg total) by mouth as directed for 6 days. Take 6,5,4,3,2,1 then stop 21 tablet 0   promethazine-dextromethorphan (PROMETHAZINE-DM) 6.25-15 MG/5ML syrup Take 5 mLs by mouth 4 (four) times daily as needed. 118 mL 0   No current facility-administered medications for this visit.    OBJECTIVE: There were no vitals filed for this visit.   There is no height or weight on file to calculate BMI.      General: Well-developed, well-nourished, no acute distress. Eyes: Pink conjunctiva, anicteric sclera. HEENT: Normocephalic, moist mucous membranes, clear oropharnyx. Lungs: Clear to auscultation bilaterally. Heart: Regular rate and rhythm. No rubs, murmurs, or gallops. Abdomen: Soft, nontender, nondistended. No organomegaly noted, normoactive bowel sounds. Musculoskeletal: No edema, cyanosis, or clubbing. Neuro: Alert, answering all questions appropriately. Cranial nerves grossly intact. Skin: No rashes or petechiae noted. Psych: Normal affect. Lymphatics: No cervical, calvicular, axillary or inguinal LAD.   LAB RESULTS:  Lab Results  Component Value Date   NA 139 03/05/2019   K 3.4 (L) 03/05/2019   CL 106 03/05/2019   CO2 25 03/05/2019   GLUCOSE 96 03/05/2019   BUN 9 09/24/2020   CREATININE 0.74 09/24/2020   CALCIUM 9.2 03/05/2019   PROT 9.0 (H) 03/05/2019   ALBUMIN 4.5 03/05/2019   AST 21 03/05/2019   ALT 15 03/05/2019   ALKPHOS 65 03/05/2019   BILITOT 0.5 03/05/2019    GFRNONAA >60 09/24/2020   GFRAA >60 09/24/2020    Lab Results  Component Value Date   WBC 11.0 (H) 10/14/2022   NEUTROABS 7.7 10/14/2022   HGB 12.2 10/14/2022   HCT 36.6 10/14/2022   MCV 85.5 10/14/2022   PLT 306 10/14/2022    No results found for: "TIBC", "FERRITIN", "IRONPCTSAT"   STUDIES: No results found.  ASSESSMENT AND PLAN:   Mallory Johnston is a 31 y.o. female with pmh of of PCOS with surgical removal of left ovary, ? Von Willebrand disease last seen hematology in 2009 was referred to hematology for prior history of von Willebrand disease and heavy menstrual period.  #??History of von Willebrand disease #Metromenorrhagia #PCOS - Von Willebrand antigen 97, activity slightly low at 42.  Factor VIII normal at 111%.  Von Willebrand multimers normal.  PT and APTT normal. Blood group A+. In regards to clinical history, she has heavy  menstrual period however has history of PCOS and are irregular.  Denies any family history of abnormal bleeding.  Today she mentioned that, previously when she abraded her hand she thinks that she bled excessively.  However denies any excessive bleeding with 2 miscarriages that she had previously.  I discussed with the patient that there is no clear-cut diagnosis of von Willebrand disease at this time.  I would like to repeat von Willebrand activity level.  Also I would like to obtain medical records from the hospital in [place]. Per patient, she was diagnosed with von Willebrand disease in [place] in 2009 when she was planned for ovary removal due to large cyst.  She does not clearly remember the events but thinks might have received vitamin K and some plasma infusion perioperatively. She denies any other surgeries or procedures. She does not know the name of the hospital and will find out from her mother.  Tomorrow when she comes back for repeat lab work, she will let the staff know and we will also sign a medical record release form.   Orders Placed  This Encounter  Procedures   Von Willebrand panel   RTC in 4 weeks for MD visit.  Patient expressed understanding and was in agreement with this plan. She also understands that She can call clinic at any time with any questions, concerns, or complaints.   I spent a total of 45 minutes reviewing chart data, face-to-face evaluation with the patient, counseling and coordination of care as detailed above.  Michaelyn Barter, MD   11/10/2022 9:02 PM

## 2022-11-10 NOTE — Progress Notes (Signed)
Patient called/ pre- screened for virtual appoinment today with oncologist. Concerns of on feet pain

## 2022-11-11 ENCOUNTER — Inpatient Hospital Stay: Payer: BC Managed Care – PPO

## 2022-11-11 DIAGNOSIS — N921 Excessive and frequent menstruation with irregular cycle: Secondary | ICD-10-CM

## 2022-11-11 DIAGNOSIS — D6800 Von Willebrand disease, unspecified: Secondary | ICD-10-CM | POA: Diagnosis not present

## 2022-11-13 LAB — VON WILLEBRAND PANEL
Coagulation Factor VIII: 127 % (ref 56–140)
Ristocetin Co-factor, Plasma: 54 % (ref 50–200)
Von Willebrand Antigen, Plasma: 104 % (ref 50–200)

## 2022-11-13 LAB — COAG STUDIES INTERP REPORT

## 2022-11-18 ENCOUNTER — Ambulatory Visit (INDEPENDENT_AMBULATORY_CARE_PROVIDER_SITE_OTHER): Payer: Self-pay | Admitting: Psychiatry

## 2022-11-18 ENCOUNTER — Other Ambulatory Visit
Admission: RE | Admit: 2022-11-18 | Discharge: 2022-11-18 | Disposition: A | Discharge status: Home / Self Care | Payer: BC Managed Care – PPO | Source: Ambulatory Visit | Attending: Psychiatry | Admitting: Psychiatry

## 2022-11-18 ENCOUNTER — Encounter: Payer: Self-pay | Admitting: Psychiatry

## 2022-11-18 VITALS — BP 117/78 | HR 102 | Temp 97.7°F | Ht 61.0 in | Wt 223.6 lb

## 2022-11-18 DIAGNOSIS — F419 Anxiety disorder, unspecified: Secondary | ICD-10-CM | POA: Insufficient documentation

## 2022-11-18 DIAGNOSIS — Z634 Disappearance and death of family member: Secondary | ICD-10-CM | POA: Insufficient documentation

## 2022-11-18 DIAGNOSIS — F331 Major depressive disorder, recurrent, moderate: Secondary | ICD-10-CM

## 2022-11-18 LAB — TSH: TSH: 0.989 u[IU]/mL (ref 0.350–4.500)

## 2022-11-18 MED ORDER — HYDROXYZINE HCL 10 MG PO TABS: 10.0000 mg | ORAL_TABLET | Freq: Three times a day (TID) | ORAL | 1 refills | Status: DC | PRN

## 2022-11-18 MED ORDER — ESCITALOPRAM OXALATE 5 MG PO TABS: 5.0000 mg | ORAL_TABLET | Freq: Every day | ORAL | 1 refills | Status: DC

## 2022-11-18 NOTE — Patient Instructions (Signed)
Hydroxyzine Capsules or Tablets What is this medication? HYDROXYZINE (hye DROX i zeen) treats the symptoms of allergies and allergic reactions. It may also be used to treat anxiety or cause drowsiness before a procedure. It works by blocking histamine, a substance released by the body during an allergic reaction. It belongs to a group of medications called antihistamines. This medicine may be used for other purposes; ask your health care provider or pharmacist if you have questions. COMMON BRAND NAME(S): ANX, Atarax, Rezine, Vistaril What should I tell my care team before I take this medication? They need to know if you have any of these conditions: Glaucoma Heart disease Irregular heartbeat or rhythm Kidney disease Liver disease Lung or breathing disease, such as asthma Stomach or intestine problems Thyroid disease Trouble passing urine An unusual or allergic reaction to hydroxyzine, other medications, foods, dyes or preservatives Pregnant or trying to get pregnant Breastfeeding How should I use this medication? Take this medication by mouth with a full glass of water. Take it as directed on the prescription label at the same time every day. You can take it with or without food. If it upsets your stomach, take it with food. Talk to your care team about the use of this medication in children. While it may be prescribed for children as young as 6 years for selected conditions, precautions do apply. People 65 years and older may have a stronger reaction and need a smaller dose. Overdosage: If you think you have taken too much of this medicine contact a poison control center or emergency room at once. NOTE: This medicine is only for you. Do not share this medicine with others. What if I miss a dose? If you miss a dose, take it as soon as you can. If it is almost time for your next dose, take only that dose. Do not take double or extra doses. What may interact with this medication? Do not  take this medication with any of the following: Cisapride Dronedarone Pimozide Thioridazine This medication may also interact with the following: Alcohol Antihistamines for allergy, cough, and cold Atropine Barbiturate medications for sleep or seizures, such as phenobarbital Certain antibiotics, such as erythromycin or clarithromycin Certain medications for anxiety or sleep Certain medications for bladder problems, such as oxybutynin or tolterodine Certain medications for irregular heartbeat Certain medications for mental health conditions Certain medications for Parkinson disease, such as benztropine, trihexyphenidyl Certain medications for seizures, such as phenobarbital or primidone Certain medications for stomach problems, such as dicyclomine or hyoscyamine Certain medications for travel sickness, such as scopolamine Ipratropium Opioid medications for pain Other medications that cause heart rhythm changes, such as dofetilide This list may not describe all possible interactions. Give your health care provider a list of all the medicines, herbs, non-prescription drugs, or dietary supplements you use. Also tell them if you smoke, drink alcohol, or use illegal drugs. Some items may interact with your medicine. What should I watch for while using this medication? Visit your care team for regular checks on your progress. Tell your care team if your symptoms do not start to get better or if they get worse. This medication may affect your coordination, reaction time, or judgment. Do not drive or operate machinery until you know how this medication affects you. Sit up or stand slowly to reduce the risk of dizzy or fainting spells. Drinking alcohol with this medication can increase the risk of these side effects. Your mouth may get dry. Chewing sugarless gum or sucking hard candy   and drinking plenty of water may help. Contact your care team if the problem does not go away or is severe. This  medication may cause dry eyes and blurred vision. If you wear contact lenses, you may feel some discomfort. Lubricating eye drops may help. See your care team if the problem does not go away or is severe. If you are receiving skin tests for allergies, tell your care team you are taking this medication. What side effects may I notice from receiving this medication? Side effects that you should report to your care team as soon as possible: Allergic reactions--skin rash, itching, hives, swelling of the face, lips, tongue, or throat Heart rhythm changes--fast or irregular heartbeat, dizziness, feeling faint or lightheaded, chest pain, trouble breathing Side effects that usually do not require medical attention (report to your care team if they continue or are bothersome): Confusion Drowsiness Dry mouth Hallucinations Headache This list may not describe all possible side effects. Call your doctor for medical advice about side effects. You may report side effects to FDA at 1-800-FDA-1088. Where should I keep my medication? Keep out of the reach of children and pets. Store at room temperature between 15 and 30 degrees C (59 and 86 degrees F). Keep container tightly closed. Throw away any unused medication after the expiration date. NOTE: This sheet is a summary. It may not cover all possible information. If you have questions about this medicine, talk to your doctor, pharmacist, or health care provider.  2023 Elsevier/Gold Standard (2008-02-05 00:00:00) Escitalopram Tablets What is this medication? ESCITALOPRAM (es sye TAL oh pram) treats depression and anxiety. It increases the amount of serotonin in the brain, a hormone that helps regulate mood. It belongs to a group of medications called SSRIs. This medicine may be used for other purposes; ask your health care provider or pharmacist if you have questions. COMMON BRAND NAME(S): Lexapro What should I tell my care team before I take this  medication? They need to know if you have any of these conditions: Bipolar disorder or a family history of bipolar disorder Diabetes Glaucoma Heart disease Kidney or liver disease Receiving electroconvulsive therapy Seizures Suicidal thoughts, plans, or attempt by you or a family member An unusual or allergic reaction to escitalopram, the related medication citalopram, other medications, foods, dyes, or preservatives Pregnant or trying to become pregnant Breast-feeding How should I use this medication? Take this medication by mouth with a glass of water. Follow the directions on the prescription label. You can take it with or without food. If it upsets your stomach, take it with food. Take your medication at regular intervals. Do not take it more often than directed. Do not stop taking this medication suddenly except upon the advice of your care team. Stopping this medication too quickly may cause serious side effects or your condition may worsen. A special MedGuide will be given to you by the pharmacist with each prescription and refill. Be sure to read this information carefully each time. Talk to your care team regarding the use of this medication in children. Special care may be needed. Overdosage: If you think you have taken too much of this medicine contact a poison control center or emergency room at once. NOTE: This medicine is only for you. Do not share this medicine with others. What if I miss a dose? If you miss a dose, take it as soon as you can. If it is almost time for your next dose, take only that dose. Do not take   double or extra doses. What may interact with this medication? Do not take this medication with any of the following: Certain medications for fungal infections like fluconazole, itraconazole, ketoconazole, posaconazole, voriconazole Cisapride Citalopram Dronedarone Linezolid MAOIs like Carbex, Eldepryl, Marplan, Nardil, and Parnate Methylene blue (injected into  a vein) Pimozide Thioridazine This medication may also interact with the following: Alcohol Amphetamines Aspirin and aspirin-like medications Carbamazepine Certain medications for depression, anxiety, or psychotic disturbances Certain medications for migraine headache like almotriptan, eletriptan, frovatriptan, naratriptan, rizatriptan, sumatriptan, zolmitriptan Certain medications for sleep Certain medications that treat or prevent blood clots like warfarin, enoxaparin, dalteparin Cimetidine Diuretics Dofetilide Fentanyl Furazolidone Isoniazid Lithium Metoprolol NSAIDs, medications for pain and inflammation, like ibuprofen or naproxen Other medications that prolong the QT interval (cause an abnormal heart rhythm) Procarbazine Rasagiline Supplements like St. John's wort, kava kava, valerian Tramadol Tryptophan Ziprasidone This list may not describe all possible interactions. Give your health care provider a list of all the medicines, herbs, non-prescription drugs, or dietary supplements you use. Also tell them if you smoke, drink alcohol, or use illegal drugs. Some items may interact with your medicine. What should I watch for while using this medication? Tell your care team if your symptoms do not get better or if they get worse. Visit your care team for regular checks on your progress. Because it may take several weeks to see the full effects of this medication, it is important to continue your treatment as prescribed by your care team. Watch for new or worsening thoughts of suicide or depression. This includes sudden changes in mood, behaviors, or thoughts. These changes can happen at any time but are more common in the beginning of treatment or after a change in dose. Call your care team right away if you experience these thoughts or worsening depression. Manic episodes may happen in patients with bipolar disorder who take this medication. Watch for changes in feelings or  behaviors such as feeling anxious, nervous, agitated, panicky, irritable, hostile, aggressive, impulsive, severely restless, overly excited and hyperactive, or trouble sleeping. These symptoms can happen at any time but are more common in the beginning of treatment or after a change in dose. Call your care team right away if you notice any of these symptoms. You may get drowsy or dizzy. Do not drive, use machinery, or do anything that needs mental alertness until you know how this medication affects you. Do not stand or sit up quickly, especially if you are an older patient. This reduces the risk of dizzy or fainting spells. Alcohol may interfere with the effect of this medication. Avoid alcoholic drinks. Your mouth may get dry. Chewing sugarless gum or sucking hard candy, and drinking plenty of water may help. Contact your care team if the problem does not go away or is severe. What side effects may I notice from receiving this medication? Side effects that you should report to your care team as soon as possible: Allergic reactions--skin rash, itching, hives, swelling of the face, lips, tongue, or throat Bleeding--bloody or black, tar-like stools, red or dark brown urine, vomiting blood or brown material that looks like coffee grounds, small, red or purple spots on skin, unusual bleeding or bruising Heart rhythm changes--fast or irregular heartbeat, dizziness, feeling faint or lightheaded, chest pain, trouble breathing Low sodium level--muscle weakness, fatigue, dizziness, headache, confusion Serotonin syndrome--irritability, confusion, fast or irregular heartbeat, muscle stiffness, twitching muscles, sweating, high fever, seizure, chills, vomiting, diarrhea Sudden eye pain or change in vision such as blurry vision,   seeing halos around lights, vision loss Thoughts of suicide or self-harm, worsening mood, feelings of depression Side effects that usually do not require medical attention (report to your  care team if they continue or are bothersome): Change in sex drive or performance Diarrhea Excessive sweating Nausea Tremors or shaking Upset stomach This list may not describe all possible side effects. Call your doctor for medical advice about side effects. You may report side effects to FDA at 1-800-FDA-1088. Where should I keep my medication? Keep out of reach of children and pets. Store at room temperature between 15 and 30 degrees C (59 and 86 degrees F). Throw away any unused medication after the expiration date. NOTE: This sheet is a summary. It may not cover all possible information. If you have questions about this medicine, talk to your doctor, pharmacist, or health care provider.  2023 [employer]vier/Gold Standard (2008-02-05 00:00:00)  
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
## 2022-11-19 ENCOUNTER — Encounter: Payer: Self-pay | Admitting: Psychiatry

## 2022-11-24 ENCOUNTER — Other Ambulatory Visit: Payer: Self-pay | Admitting: Internal Medicine

## 2022-11-24 DIAGNOSIS — F332 Major depressive disorder, recurrent severe without psychotic features: Secondary | ICD-10-CM

## 2022-11-25 NOTE — Telephone Encounter (Signed)
Requested medication (s) are due for refill today:   Yes  Requested medication (s) are on the active medication list:   Yes  Future visit scheduled:   No     Last ordered: 10/01/2022 #30, 1 refill  Returned because a 90 day supply and DX Code are being requested   Requested Prescriptions  Pending Prescriptions Disp Refills   mirtazapine (REMERON) 45 MG tablet [Pharmacy Med Name: MIRTAZAPINE 45 MG TABLET] 90 tablet 1    Sig: TAKE 1 TABLET BY MOUTH AT BEDTIME.     Psychiatry: Antidepressants - mirtazapine Passed - 11/24/2022  1:32 PM      Passed - Completed PHQ-2 or PHQ-9 in the last 360 days      Passed - Valid encounter within last 6 months    Recent Outpatient Visits           2 weeks ago Disorder of right eustachian tube   [place] [facility] and [facility] Medicine at [facility], Nyoka Cowden, MD   1 month ago Severe recurrent major depression without psychotic features [facility])   [place] Primary Care and Sports Medicine at [facility], Nyoka Cowden, MD   8 months ago Gastroenteritis   [place] Primary Care and Sports Medicine at [facility], Nyoka Cowden, MD   11 months ago COVID-19 virus infection   [facility] Primary Care and Sports Medicine at [facility], Nyoka Cowden, MD   1 year ago Vertigo   [facility] Primary Care and Sports Medicine at [facility] Nyoka Cowden, MD

## 2022-12-08 ENCOUNTER — Inpatient Hospital Stay: Payer: BC Managed Care – PPO | Attending: Internal Medicine | Admitting: Internal Medicine

## 2022-12-08 ENCOUNTER — Encounter: Payer: Self-pay | Admitting: Internal Medicine

## 2022-12-08 VITALS — BP 138/98 | HR 112 | Temp 98.6°F | Resp 20 | Wt 228.3 lb

## 2022-12-08 DIAGNOSIS — Z79899 Other long term (current) drug therapy: Secondary | ICD-10-CM | POA: Diagnosis not present

## 2022-12-08 DIAGNOSIS — F32A Depression, unspecified: Secondary | ICD-10-CM | POA: Insufficient documentation

## 2022-12-08 DIAGNOSIS — Z862 Personal history of diseases of the blood and blood-forming organs and certain disorders involving the immune mechanism: Secondary | ICD-10-CM

## 2022-12-08 DIAGNOSIS — Z8249 Family history of ischemic heart disease and other diseases of the circulatory system: Secondary | ICD-10-CM | POA: Insufficient documentation

## 2022-12-08 DIAGNOSIS — D6800 Von Willebrand disease, unspecified: Secondary | ICD-10-CM | POA: Insufficient documentation

## 2022-12-08 DIAGNOSIS — Z833 Family history of diabetes mellitus: Secondary | ICD-10-CM | POA: Insufficient documentation

## 2022-12-08 DIAGNOSIS — F419 Anxiety disorder, unspecified: Secondary | ICD-10-CM | POA: Insufficient documentation

## 2022-12-08 DIAGNOSIS — Z818 Family history of other mental and behavioral disorders: Secondary | ICD-10-CM | POA: Insufficient documentation

## 2022-12-08 NOTE — Progress Notes (Addendum)
[facility]  [phone] [phone]   ID: Mallory Johnston OB: [birth year]  MR#: [id number]  QBH#:[id number]  Patient Care Team: Reubin Milan, MD as PCP - General (Internal Medicine) Hildred Laser, MD as Referring Physician (Obstetrics and Gynecology)  REFERRING PROVIDER: Dr. Judithann Graves  REASON FOR REFERRAL: hx of vWD  HPI: Mallory Johnston is a 31 y.o. female with past medical history of PCOS with surgical removal of left ovary, ? Von Willebrand disease last seen hematology in 2009 was referred to hematology for prior history of von Willebrand disease and heavy menstrual period.  Per patient, she was diagnosed with von Willebrand disease in [place] in 2009 when she was planned for ovary removal due to large cyst.  She does not clearly remember the events but thinks might have received vitamin K and some plasma infusion perioperatively. She denies any other surgeries or procedures.  She does have heavy irregular menstrual period since menses.  Has diagnosis of PCOS and has tried different OCPs with no improvement.  Recently she was taken off OCPs due to side effects.  Denies any family history of abnormal bleeding.  Her sister was tested recently due to her pregnancy and her von Willebrand levels were normal.    She had 2 miscarriages in August 2015 and July 2021.  Denies any history of blood clots.  INTERVAL HISTORY-  Patient seen today to discuss labs.. Patient has been feeling okay.  Denies any new complaints.  Her last menstrual period was end of October which was heavy for initial 2 days.  REVIEW OF SYSTEMS:   Review of Systems  All other systems reviewed and are negative.   As per HPI. Otherwise, a complete review of systems is negative.  PAST MEDICAL HISTORY: Past Medical History:  Diagnosis Date   Anxiety    Depression    H/O bilateral breast reduction surgery    History of left oophorectomy 2009   due to ovarian cyst    Lymphadenopathy 2009   left leg   Lymphedema of left leg    S/P tonsillectomy    Suicidal ideation 03/06/2019   Von Willebrand disease (HCC) 2009    PAST SURGICAL HISTORY: Past Surgical History:  Procedure Laterality Date   BREAST REDUCTION SURGERY  2004   LOWER EXTREMITY VENOGRAPHY Left 09/24/2020   Procedure: LOWER EXTREMITY VENOGRAPHY;  Surgeon: Annice Needy, MD;  Location: [facility] INVASIVE CV LAB;  Service: Cardiovascular;  Laterality: Left;   OVARIAN CYST REMOVAL     REDUCTION MAMMAPLASTY     TONSILLECTOMY      FAMILY HISTORY: Family History  Problem Relation Age of Onset   Depression Mother    Diabetes Mother    Hypertension Father    Breast cancer Neg Hx    Ovarian cancer Neg Hx    Colon cancer Neg Hx     HEALTH MAINTENANCE: Social History   Tobacco Use   Smoking status: Never   Smokeless tobacco: Never  Vaping Use   Vaping Use: Never used  Substance Use Topics   Alcohol use: Yes    Comment: rare   Drug use: No     Allergies  Allergen Reactions   Salmon [Fish Oil]    Amoxicillin Rash   Percocet [Oxycodone-Acetaminophen] Nausea And Vomiting and Rash    Current Outpatient Medications  Medication Sig Dispense Refill   cetirizine (ZYRTEC ALLERGY) 10 MG tablet Take 1 tablet (10 mg total) by mouth daily. 90 tablet 1  escitalopram (LEXAPRO) 5 MG tablet Take 1 tablet (5 mg total) by mouth daily with breakfast. 30 tablet 1   fluticasone (FLONASE) 50 MCG/ACT nasal spray Place 2 sprays into both nostrils daily. 16 g 6   hydrOXYzine (ATARAX) 10 MG tablet Take 1 tablet (10 mg total) by mouth 3 (three) times daily as needed for anxiety. 90 tablet 1   medroxyPROGESTERone (PROVERA) 10 MG tablet Take 1 tablet (10 mg total) by mouth daily. Use for ten days. Take every 3 months to induce menses 10 tablet 2   mirtazapine (REMERON) 45 MG tablet TAKE 1 TABLET BY MOUTH AT BEDTIME. 90 tablet 0   ipratropium (ATROVENT) 0.06 % nasal spray Place 2 sprays into both nostrils 4  (four) times daily. (Patient not taking: Reported on 11/18/2022) 15 mL 12   promethazine-dextromethorphan (PROMETHAZINE-DM) 6.25-15 MG/5ML syrup Take 5 mLs by mouth 4 (four) times daily as needed. (Patient not taking: Reported on 12/08/2022) 118 mL 0   No current facility-administered medications for this visit.    OBJECTIVE: Vitals:   12/08/22 1536  BP: (!) 138/98  Pulse: (!) 112  Resp: 20  Temp: 98.6 F (37 C)  SpO2: 100%     Body mass index is 43.14 kg/m.      General: Well-developed, well-nourished, no acute distress. Eyes: Pink conjunctiva, anicteric sclera. HEENT: Normocephalic, moist mucous membranes, clear oropharnyx. Lungs: Clear to auscultation bilaterally. Heart: Regular rate and rhythm. No rubs, murmurs, or gallops. Abdomen: Soft, nontender, nondistended. No organomegaly noted, normoactive bowel sounds. Musculoskeletal: No edema, cyanosis, or clubbing. Neuro: Alert, answering all questions appropriately. Cranial nerves grossly intact. Skin: No rashes or petechiae noted. Psych: Normal affect. Lymphatics: No cervical, calvicular, axillary or inguinal LAD.   LAB RESULTS:  Lab Results  Component Value Date   NA 139 03/05/2019   K 3.4 (L) 03/05/2019   CL 106 03/05/2019   CO2 25 03/05/2019   GLUCOSE 96 03/05/2019   BUN 9 09/24/2020   CREATININE 0.74 09/24/2020   CALCIUM 9.2 03/05/2019   PROT 9.0 (H) 03/05/2019   ALBUMIN 4.5 03/05/2019   AST 21 03/05/2019   ALT 15 03/05/2019   ALKPHOS 65 03/05/2019   BILITOT 0.5 03/05/2019   GFRNONAA >60 09/24/2020   GFRAA >60 09/24/2020    Lab Results  Component Value Date   WBC 11.0 (H) 10/14/2022   NEUTROABS 7.7 10/14/2022   HGB 12.2 10/14/2022   HCT 36.6 10/14/2022   MCV 85.5 10/14/2022   PLT 306 10/14/2022    No results found for: "TIBC", "FERRITIN", "IRONPCTSAT"   STUDIES: No results found.  ASSESSMENT AND PLAN:   Mallory Johnston is a 31 y.o. female with pmh of of PCOS with surgical removal of left  ovary, ? Von Willebrand disease last seen hematology in 2009 was referred to hematology for prior history of von Willebrand disease and heavy menstrual period.  #??History of von Willebrand disease #Metromenorrhagia #PCOS - Per patient, she was diagnosed with von Willebrand disease in [place] in 2009 when she was planned for ovary removal due to large cyst.  She does not clearly remember the events but thinks might have received vitamin K and some plasma infusion perioperatively. She denies any other surgeries or procedures.  Our medical records staff reached out to [facility] in [place].  As per their policy, records are destroyed after 7 years.  So unfortunately we could not get any further information.  -Initial VWF panel showed Von Willebrand antigen 97, activity slightly low  at 42.  Factor VIII normal at 111%.  Levels were repeated due to borderline low.  Repeat von Willebrand factor activity was normal.  Multimers normal.  PT/APTT normal.  There is no family hx of excessive bleeding.   - Discussed with the patient in detail that she does NOT have evidence of von Willebrand disease based on the lab results.  There is no indication for any factor or plasma infusion. vWF levels can increase with age and it is possible that she may have outgrown the diagnosis.  Her menometrorrhagia could be related to PCOS and she will continue to follow with Dr. Valentino Saxon for hormonal interventions.    - She will follow-up with me when she is planned for any procedure or pregnancy and I can recheck her von Willebrand level.    RTC PRN  Patient expressed understanding and was in agreement with this plan. She also understands that She can call clinic at any time with any questions, concerns, or complaints.   I spent a total of 30 minutes reviewing chart data, face-to-face evaluation with the patient, counseling and coordination of care as detailed above.  Michaelyn Barter, MD   12/08/2022 4:08 PM

## 2022-12-08 NOTE — Progress Notes (Signed)
Patient has no concerns today. 

## 2022-12-10 ENCOUNTER — Other Ambulatory Visit: Payer: Self-pay | Admitting: Psychiatry

## 2022-12-10 DIAGNOSIS — F331 Major depressive disorder, recurrent, moderate: Secondary | ICD-10-CM

## 2022-12-10 DIAGNOSIS — Z634 Disappearance and death of family member: Secondary | ICD-10-CM

## 2022-12-10 DIAGNOSIS — F419 Anxiety disorder, unspecified: Secondary | ICD-10-CM

## 2022-12-16 ENCOUNTER — Ambulatory Visit (INDEPENDENT_AMBULATORY_CARE_PROVIDER_SITE_OTHER): Payer: BC Managed Care – PPO | Admitting: Obstetrics and Gynecology

## 2022-12-16 ENCOUNTER — Encounter: Payer: Self-pay | Admitting: Obstetrics and Gynecology

## 2022-12-16 VITALS — BP 120/87 | HR 91 | Ht 61.0 in | Wt 227.1 lb

## 2022-12-16 DIAGNOSIS — N926 Irregular menstruation, unspecified: Secondary | ICD-10-CM

## 2022-12-16 DIAGNOSIS — E282 Polycystic ovarian syndrome: Secondary | ICD-10-CM

## 2022-12-16 MED ORDER — DROSPIRENONE-ETHINYL ESTRADIOL 3-0.03 MG PO TABS: 1.0000 | ORAL_TABLET | Freq: Every day | ORAL | 3 refills | Status: DC

## 2022-12-16 NOTE — Progress Notes (Signed)
    GYNECOLOGY PROGRESS NOTE  Subjective:    Patient ID: Mallory Johnston, female    DOB: [birth year], 31 y.o.   MRN: [id number]  HPI  Patient is a 31 y.o. G21P0010 female who presents for follow up on PCOS. Changed from [facility] to Provera tablets q 3 months due to issues with unilateral breast swelling thought to be due to the birth control so discontinued.  Notes that breast swelling has finally gone down after several months.  Reports that she was seen by a Hematologist and apparently no longer has von Willebrand's disease (notes labs were normal; questioning if patient truly had diagnosis in her teen years but unable to get records from [place] due to purging). Notes that after taking the Provera, she had some spotting for several days.   Of note, patient reports that since being on the Provera, her cysts have gotten worse.  States she had significant pain in October that was debilitating for several days, and also had another possible cyst last week, but the pain was more tolerable that time.    The following portions of the patient's history were reviewed and updated as appropriate: allergies, current medications, past family history, past medical history, past social history, past surgical history, and problem list.  Review of Systems Pertinent items noted in HPI and remainder of comprehensive ROS otherwise negative.   Objective:   Blood pressure 120/87, pulse 91, height 5\' 1"  (1.549 m), weight 227 lb 1.6 oz (103 kg), last menstrual period 10/09/2022. Body mass index is 42.91 kg/m. General appearance: alert and no distress Remainder of exam deferred.    Assessment:   1. Irregular menses   2. PCOS (polycystic ovarian syndrome)      Plan:   Discussed other available options for further management of her PCOS now that she apparently does not have von Willebrand's disease.  Discussed restarting a different progesterone OCP vs attempting management with a combined OCP such as Yas (as  patient also notes other symptoms such as facial hair growth and mild acne). Patient ok to try Yas.  Will prescribe.  To f/u again in 3 months.    12/09/2022, MD [facility]

## 2022-12-30 ENCOUNTER — Ambulatory Visit (INDEPENDENT_AMBULATORY_CARE_PROVIDER_SITE_OTHER): Payer: BC Managed Care – PPO | Admitting: Psychiatry

## 2022-12-30 ENCOUNTER — Encounter: Payer: Self-pay | Admitting: Psychiatry

## 2022-12-30 VITALS — BP 123/84 | HR 103 | Temp 98.2°F | Ht 61.0 in | Wt 230.0 lb

## 2022-12-30 DIAGNOSIS — Z634 Disappearance and death of family member: Secondary | ICD-10-CM | POA: Diagnosis not present

## 2022-12-30 DIAGNOSIS — F411 Generalized anxiety disorder: Secondary | ICD-10-CM

## 2022-12-30 DIAGNOSIS — F331 Major depressive disorder, recurrent, moderate: Secondary | ICD-10-CM

## 2022-12-30 MED ORDER — MIRTAZAPINE 30 MG PO TABS: 30.0000 mg | ORAL_TABLET | Freq: Every day | ORAL | 0 refills | Status: DC

## 2022-12-30 MED ORDER — ESCITALOPRAM OXALATE 10 MG PO TABS: 10.0000 mg | ORAL_TABLET | Freq: Every day | ORAL | 0 refills | Status: DC

## 2022-12-30 NOTE — Progress Notes (Signed)
[facility] MD OP Progress Note  12/30/2022 4:47 PM Mallory Johnston  MRN:  [id number]  Chief Complaint:  Chief Complaint  Patient presents with   Follow-up   Medication Refill   Anxiety   Depression   HPI: Mallory Johnston is a 32 year old African-American female, employed, lives in [place], has a history of depression, bereavement, anxiety, von Willebrand's disease, PCOS, was evaluated in office today.  Patient reports she spent her Christmas with her family however this past christmas since her grandmother was not here for the first time, it was hard.  Her grandmother passed away a few months ago.  She however reports she has been coping okay.  Reports she does have a lot of anxiety worries a lot about work-related stressors.  Patient however reports she looks forward to a break that she is going to take from work soon.  She reports her depression symptoms may have improved since being on the Lexapro.  She however has to make sure she takes her Lexapro regularly since she does not eat breakfast and has been taking it with lunch.  There has been a few days when she missed.  Denies side effects.  Patient denies suicidality, homicidality or perceptual disturbances.  Continues to follow-up with her therapist.  Does struggle with sleep since the past few days.  Reports she has difficulty falling asleep.  May have also slept through her alarm some days.  Does take the mirtazapine at night.  Reports she also does not like the fact that her housing situation is not currently very safe.  Reports housing inspection is done without taking prior permission, that means they can come into her apartment anytime they want to.  She hence is currently looking for other housing.  That is a stressor for her.  Patient denies any other concerns today.  Visit Diagnosis:    ICD-10-CM   1. MDD (major depressive disorder), recurrent episode, moderate (HCC)  F33.1 escitalopram (LEXAPRO) 10 MG tablet    mirtazapine  (REMERON) 30 MG tablet    2. Bereavement  Z63.4     3. Generalized anxiety disorder  F41.1 escitalopram (LEXAPRO) 10 MG tablet    mirtazapine (REMERON) 30 MG tablet      Past Psychiatric History: Reviewed past psychiatric history from progress note on 11/18/2022.  Past trials of trazodone, zolpidem. Past Medical History:  Past Medical History:  Diagnosis Date   Anxiety    Depression    H/O bilateral breast reduction surgery    History of left oophorectomy 2009   due to ovarian cyst   Lymphadenopathy 2009   left leg   Lymphedema of left leg    S/P tonsillectomy    Suicidal ideation 03/06/2019   Von Willebrand disease ([place]) 2009    Past Surgical History:  Procedure Laterality Date   BREAST REDUCTION SURGERY  2004   LOWER EXTREMITY VENOGRAPHY Left 09/24/2020   Procedure: LOWER EXTREMITY VENOGRAPHY;  Surgeon: Algernon Huxley, MD;  Location: [facility];  Service: Cardiovascular;  Laterality: Left;   OVARIAN CYST REMOVAL     REDUCTION MAMMAPLASTY     TONSILLECTOMY      Family Psychiatric History: Reviewed family psychiatric history from progress note on 11/18/2022.  Family History:  Family History  Problem Relation Age of Onset   Depression Mother    Diabetes Mother    Hypertension Father    Breast cancer Neg Hx    Ovarian cancer Neg Hx    Colon cancer Neg Hx  Social History: Reviewed social history from progress note on 11/18/2022. Social History   Socioeconomic History   Marital status: Single    Spouse name: Not on file   Number of children: Not on file   Years of education: Not on file   Highest education level: Some Johnston, no degree  Occupational History   Not on file  Tobacco Use   Smoking status: Never   Smokeless tobacco: Never  Vaping Use   Vaping Use: Never used  Substance and Sexual Activity   Alcohol use: Not Currently    Comment: rare   Drug use: No   Sexual activity: Not Currently    Birth control/protection: None  Other Topics  Concern   Not on file  Social History Narrative   Not on file   Social Determinants of Health   Financial Resource Strain: Low Risk  (11/07/2022)   Overall Financial Resource Strain (CARDIA)    Difficulty of Paying Living Expenses: Not hard at all  Food Insecurity: No Food Insecurity (11/07/2022)   Hunger Vital Sign    Worried About Running Out of Food in the Last Year: Never true    Mallory Johnston in the Last Year: Never true  Transportation Needs: No Transportation Needs (11/07/2022)   PRAPARE - Hydrologist (Medical): No    Lack of Transportation (Non-Medical): No  Physical Activity: Not on file  Stress: Not on file  Social Connections: Not on file    Allergies:  Allergies  Allergen Reactions   Salmon [Fish Oil]    Amoxicillin Rash   Percocet [Oxycodone-Acetaminophen] Nausea And Vomiting and Rash    Metabolic Disorder Labs: Lab Results  Component Value Date   HGBA1C 5.3 03/08/2019   MPG 105.41 03/08/2019   No results found for: "PROLACTIN" Lab Results  Component Value Date   CHOL 148 03/08/2019   TRIG 115 03/08/2019   HDL 52 03/08/2019   CHOLHDL 2.8 03/08/2019   VLDL 23 03/08/2019   LDLCALC 73 03/08/2019   Lab Results  Component Value Date   TSH 0.989 11/18/2022   TSH 1.201 03/08/2019    Therapeutic Level Labs: No results found for: "LITHIUM" No results found for: "VALPROATE" No results found for: "CBMZ"  Current Medications: Current Outpatient Medications  Medication Sig Dispense Refill   cetirizine (ZYRTEC ALLERGY) 10 MG tablet Take 1 tablet (10 mg total) by mouth daily. 90 tablet 1   drospirenone-ethinyl estradiol (YASMIN 28) 3-0.03 MG tablet Take 1 tablet by mouth daily. 64 tablet 3   escitalopram (LEXAPRO) 10 MG tablet Take 1 tablet (10 mg total) by mouth daily with breakfast. 90 tablet 0   fluticasone (FLONASE) 50 MCG/ACT nasal spray Place 2 sprays into both nostrils daily. 16 g 6   hydrOXYzine (ATARAX) 10 MG  tablet TAKE 1 TABLET BY MOUTH 3 TIMES DAILY AS NEEDED FOR ANXIETY. 270 tablet 0   mirtazapine (REMERON) 30 MG tablet Take 1 tablet (30 mg total) by mouth at bedtime. 90 tablet 0   No current facility-administered medications for this visit.     Musculoskeletal: Strength & Muscle Tone: within normal limits Gait & Station: normal Patient leans: N/A  Psychiatric Specialty Exam: Review of Systems  Psychiatric/Behavioral:  Positive for dysphoric mood and sleep disturbance. The patient is nervous/anxious.   All other systems reviewed and are negative.   Blood pressure 123/84, pulse (!) 103, temperature 98.2 F (36.8 C), temperature source Oral, height 5\' 1"  (1.549 m), weight 230 lb (  104.3 kg), last menstrual period 10/09/2022, SpO2 98 %.Body mass index is 43.46 kg/m.  General Appearance: Casual  Eye Contact:  Fair  Speech:  Clear and Coherent  Volume:  Normal  Mood:  Anxious and Depressed  Affect:  Congruent  Thought Process:  Goal Directed and Descriptions of Associations: Intact  Orientation:  Full (Time, Place, and Person)  Thought Content: Logical   Suicidal Thoughts:  No  Homicidal Thoughts:  No  Memory:  Immediate;   Fair Recent;   Fair Remote;   Fair  Judgement:  Fair  Insight:  Fair  Psychomotor Activity:  Normal  Concentration:  Concentration: Fair and Attention Span: Fair  Recall:  [employer] of Knowledge: Fair  Language: Fair  Akathisia:  No  Handed:  Right  AIMS (if indicated): not done  Assets:  Communication Skills Desire for Improvement Housing Social Support  ADL's:  Intact  Cognition: WNL  Sleep:  Poor   Screenings: AIMS    Flowsheet Row Admission (Discharged) from 03/07/2019 in [facility]  AIMS Total Score 0      AUDIT    Flowsheet Row Admission (Discharged) from 03/07/2019 in [facility] INPATIENT BEHAV[facility]AL MEDICINE  Alcohol Use Disorder Identification Test Final Score (AUDIT) 0      GAD-7    Flowsheet Row Office Visit  from 12/30/2022 in [facility] Office Visit from 11/18/2022 in [facility] Office Visit from 11/07/2022 in [facility] at [facility] Office Visit from 10/01/2022 in [facility] and [facility] Medicine at [facility] Office Visit from 03/06/2022 in [facility] Primary Care and Sports Medicine at [facility]  Total GAD-7 Score 14 17 11 10 8       PHQ2-9    Flowsheet Row Office Visit from 12/30/2022 in [facility] Office Visit from 11/18/2022 in [facility] Office Visit from 11/07/2022 in [facility] Primary Care and Sports Medicine at [facility] Office Visit from 10/14/2022 in [facility] Office Visit from 10/01/2022 in [facility] Primary Care and Sports Medicine at [facility]  PHQ-2 Total Score 3 3 2 2 4   PHQ-9 Total Score 10 14 10  -- 14      Flowsheet Row Office Visit from 12/30/2022 in [facility] Office Visit from 11/18/2022 in [facility] ED from 10/27/2022 in [facility] at [facility]   C-SSRS RISK CATEGORY No Risk No Risk No Risk        Assessment and Plan: MONQUIE FULGHAM is a 32 year old African-American female, currently struggling with anxiety, sleep problems, some improvement with regards to her depression, will benefit from continued medication management, psychotherapy sessions.  Plan MDD-improving Mirtazapine as prescribed Patient advised to use hydroxyzine as needed for sleep.  Patient to work on sleep hygiene. Increase Lexapro to 10 mg p.o. daily with breakfast Discussed serotonin syndrome, drug to drug interactions. Continue CBT with [name].  GAD-unstable Increase Lexapro to 10 mg p.o. daily Reduce mirtazapine to 30 mg p.o. nightly Hydroxyzine 10 mg p.o. 3 times daily as needed for severe anxiety and  sleep. Continues to CBT.  Bereavement-improving Patient to continue CBT  Reviewed labs-TSH-within normal limits dated 11/18/2022.  Follow-up in clinic in 4 weeks or sooner if needed.   This note was generated in part or whole with voice recognition software. Voice recognition is usually quite accurate but there are transcription errors that can and very often  do occur. I apologize for any typographical errors that were not detected and corrected.     Jomarie Longs, MD 12/30/2022, 4:47 PM

## 2022-12-30 NOTE — Patient Instructions (Signed)
Serotonin Syndrome Serotonin is a chemical that helps to control several functions in the body. This chemical is also called a neurotransmitter. It controls: Brain and nerve cell function. Mood and emotions. Memory. Eating. Sleeping. Sexual activity. Stress response. Having too much serotonin in your body can cause serotonin syndrome. This condition can be harmful to your brain and nerve cells. This can be a life-threatening condition. What are the causes? This condition may be caused by taking medicines or drugs that increase the level of serotonin in your body, such as: Antidepressant medicines. Migraine medicines. Certain pain medicines. Certain drugs, including ecstasy, LSD, cocaine, and amphetamines. Over-the-counter cough or cold medicines that contain dextromethorphan. Certain herbal supplements, including St. John's wort, ginseng, and nutmeg. This condition usually occurs when you take these medicines or drugs together, but it can also happen with a high dose of a single medicine or drug. What increases the risk? You are more likely to develop this condition if: You just started taking a medicine or drug that increases the level of serotonin in the body. You recently increased the dose of a medicine or drug that increases the level of serotonin in the body. You take more than one medicine or drug that increases the level of serotonin in the body. What are the signs or symptoms? Symptoms of this condition usually start within several hours of taking a medicine or drug. Symptoms may be mild or severe. Mild symptoms include: Sweating. Restlessness or agitation. Muscle twitching or stiffness. Rapid heart rate. Nausea, vomiting, or diarrhea. Shivering or goose bumps. Confusion. Severe symptoms include: Irregular heartbeat. Seizures. Loss of consciousness. High fever. How is this diagnosed? This condition may be diagnosed based on: Your medical history. A physical  exam. Your prior use of drugs and medicines. Blood or urine tests. These may be used to rule out other causes of your symptoms. How is this treated? The treatment for this condition depends on the severity of your symptoms. For mild cases, stopping the medicine or drug that caused your condition is usually all that is needed. For moderate to severe cases, treatment in a hospital may be needed to prevent or treat life-threatening symptoms. Treatment may include: Medicines to control your symptoms. IV fluids. Actions to support your breathing. Treatments to control your body temperature. Follow these instructions at home: Medicines  Take over-the-counter and prescription medicines only as told by your health care provider. Check with your health care provider before you start taking any new prescriptions, over-the-counter medicines, herbs, or supplements. Do not combine any medicines that can cause this condition. Lifestyle  Maintain a healthy lifestyle. Eat a healthy diet that includes plenty of vegetables, fruits, whole grains, low-fat dairy products, and lean protein. Do not eat a lot of foods that are high in fat, added sugars, or salt. Get the right amount and quality of sleep. Most adults need 7-9 hours of sleep each night. Make time to exercise, even if it is only for short periods of time. Most adults should exercise for at least 150 minutes each week. Do not drink alcohol. Do not use illegal drugs. Do not take medicines for reasons other than they are prescribed. General instructions Do not use any products that contain nicotine or tobacco. These products include cigarettes, chewing tobacco, and vaping devices, such as e-cigarettes. If you need help quitting, ask your health care provider. Contact a health care provider if: Your symptoms do not improve or they get worse. Get help right away if: You have worsening   confusion, severe headache, chest pain, high fever, seizures, or  loss of consciousness. You experience serious side effects of medicine, such as swelling of your face, lips, tongue, or throat. These symptoms may be an emergency. Get help right away. Call 911. Do not wait to see if the symptoms will go away. Do not drive yourself to the hospital. Also, get help right away if: You have serious thoughts about hurting yourself or others. Take one of these steps if you feel like you may hurt yourself or others, or have thoughts about taking your own life: Go to your nearest emergency room. Call 911. Call the National Suicide Prevention Lifeline at 1-800-273-8255 or 988. This is open 24 hours a day. Text the Crisis Text Line at 741741. Summary Serotonin is a chemical that helps to control several functions in the body. High levels of serotonin in the body can cause serotonin syndrome, which can be life-threatening. This condition may be caused by taking medicines or drugs that increase the level of serotonin in your body. Treatment depends on the severity of your symptoms. For mild cases, stopping the medicine or drug that caused your condition is usually all that is needed. Check with your health care provider before you start taking any new prescriptions, over-the-counter medicines, herbs, or supplements. This information is not intended to replace advice given to you by your health care provider. Make sure you discuss any questions you have with your health care provider. Document Revised: 03/06/2022 Document Reviewed: 03/06/2022 Elsevier Patient Education  2023 [employer]  

## 2023-02-11 ENCOUNTER — Ambulatory Visit (INDEPENDENT_AMBULATORY_CARE_PROVIDER_SITE_OTHER): Payer: BC Managed Care – PPO | Admitting: Psychiatry

## 2023-02-11 ENCOUNTER — Encounter: Payer: Self-pay | Admitting: Psychiatry

## 2023-02-11 VITALS — BP 117/76 | HR 97 | Temp 98.1°F | Ht 61.0 in | Wt 221.2 lb

## 2023-02-11 DIAGNOSIS — F411 Generalized anxiety disorder: Secondary | ICD-10-CM

## 2023-02-11 DIAGNOSIS — Z634 Disappearance and death of family member: Secondary | ICD-10-CM | POA: Diagnosis not present

## 2023-02-11 DIAGNOSIS — F3341 Major depressive disorder, recurrent, in partial remission: Secondary | ICD-10-CM

## 2023-02-11 NOTE — Progress Notes (Unsigned)
[facility] MD OP Progress Note  02/11/2023 8:57 AM TIGER PRIMAS  MRN:  [id number]  Chief Complaint:  Chief Complaint  Patient presents with   Follow-up   Anxiety   Depression   Medication Refill   HPI: Mallory Johnston is a 32 year old African-American female, employed, lives in [place], has a history of depression, bereavement, anxiety, von Willebrand's disease, PCOS was evaluated in office today.  Patient today reports she is currently taking Lexapro 10 mg daily as discussed.  She however reports she has had some GI symptoms since being on the Lexapro since she often does not take her breakfast.  She is not a breakfast person.  She reports abdominal pain and nausea however is able to manage it at this time.  She would like to stay on the Lexapro and will be mindful of starting a breakfast routine.  Patient however reports she has noticed her depression has improved.  She continues to have anxiety since she is moving to a new home and that does worry her due to all the changes.  She continues to follow-up with her therapist every 2 weeks.  That has been beneficial.  Reports sleep is overall good.  Compliant on mirtazapine.  Denies side effects.  Patient denies any suicidality, homicidality or perceptual disturbances.  Patient denies any other concerns today.  Visit Diagnosis:    ICD-10-CM   1. MDD (major depressive disorder), recurrent, in partial remission ([place])  F33.41     2. Bereavement  Z63.4     3. Generalized anxiety disorder  F41.1       Past Psychiatric History: Reviewed past psychiatric history from progress note on 11/18/2022.  Past trials of trazodone, zolpidem.  Past Medical History:  Past Medical History:  Diagnosis Date   Anxiety    Depression    H/O bilateral breast reduction surgery    History of left oophorectomy 2009   due to ovarian cyst   Lymphadenopathy 2009   left leg   Lymphedema of left leg    S/P tonsillectomy    Suicidal ideation 03/06/2019   Von  Willebrand disease ([place]) 2009    Past Surgical History:  Procedure Laterality Date   BREAST REDUCTION SURGERY  2004   LOWER EXTREMITY VENOGRAPHY Left 09/24/2020   Procedure: LOWER EXTREMITY VENOGRAPHY;  Surgeon: Algernon Huxley, MD;  Location: [facility];  Service: Cardiovascular;  Laterality: Left;   OVARIAN CYST REMOVAL     REDUCTION MAMMAPLASTY     TONSILLECTOMY      Family Psychiatric History: Reviewed family psychiatric history from progress note on 11/18/2022.  Family History:  Family History  Problem Relation Age of Onset   Depression Mother    Diabetes Mother    Hypertension Father    Breast cancer Neg Hx    Ovarian cancer Neg Hx    Colon cancer Neg Hx     Social History: Reviewed social history from progress note on 11/18/2022. Social History   Socioeconomic History   Marital status: Single    Spouse name: Not on file   Number of children: Not on file   Years of education: Not on file   Highest education level: Some college, no degree  Occupational History   Not on file  Tobacco Use   Smoking status: Never   Smokeless tobacco: Never  Vaping Use   Vaping Use: Never used  Substance and Sexual Activity   Alcohol use: Not Currently    Comment: rare   Drug  use: No   Sexual activity: Not Currently    Birth control/protection: None  Other Topics Concern   Not on file  Social History Narrative   Not on file   Social Determinants of Health   Financial Resource Strain: Low Risk  (11/07/2022)   Overall Financial Resource Strain (CARDIA)    Difficulty of Paying Living Expenses: Not hard at all  Food Insecurity: No Food Insecurity (11/07/2022)   Hunger Vital Sign    Worried About Running Out of Food in the Last Year: Never true    Ran Out of Food in the Last Year: Never true  Transportation Needs: No Transportation Needs (11/07/2022)   PRAPARE - Hydrologist (Medical): No    Lack of Transportation (Non-Medical): No   Physical Activity: Not on file  Stress: Not on file  Social Connections: Not on file    Allergies:  Allergies  Allergen Reactions   Salmon [Fish Oil]    Amoxicillin Rash   Percocet [Oxycodone-Acetaminophen] Nausea And Vomiting and Rash    Metabolic Disorder Labs: Lab Results  Component Value Date   HGBA1C 5.3 03/08/2019   MPG 105.41 03/08/2019   No results found for: "PROLACTIN" Lab Results  Component Value Date   CHOL 148 03/08/2019   TRIG 115 03/08/2019   HDL 52 03/08/2019   CHOLHDL 2.8 03/08/2019   VLDL 23 03/08/2019   LDLCALC 73 03/08/2019   Lab Results  Component Value Date   TSH 0.989 11/18/2022   TSH 1.201 03/08/2019    Therapeutic Level Labs: No results found for: "LITHIUM" No results found for: "VALPROATE" No results found for: "CBMZ"  Current Medications: Current Outpatient Medications  Medication Sig Dispense Refill   cetirizine (ZYRTEC ALLERGY) 10 MG tablet Take 1 tablet (10 mg total) by mouth daily. 90 tablet 1   drospirenone-ethinyl estradiol (YASMIN 28) 3-0.03 MG tablet Take 1 tablet by mouth daily. 64 tablet 3   escitalopram (LEXAPRO) 10 MG tablet Take 1 tablet (10 mg total) by mouth daily with breakfast. 90 tablet 0   fluticasone (FLONASE) 50 MCG/ACT nasal spray Place 2 sprays into both nostrils daily. 16 g 6   hydrOXYzine (ATARAX) 10 MG tablet TAKE 1 TABLET BY MOUTH 3 TIMES DAILY AS NEEDED FOR ANXIETY. 270 tablet 0   mirtazapine (REMERON) 30 MG tablet Take 1 tablet (30 mg total) by mouth at bedtime. 90 tablet 0   No current facility-administered medications for this visit.     Musculoskeletal: Strength & Muscle Tone: within normal limits Gait & Station: normal Patient leans: N/A  Psychiatric Specialty Exam: Review of Systems  Psychiatric/Behavioral:  The patient is nervous/anxious.   All other systems reviewed and are negative.   Blood pressure 117/76, pulse 97, temperature 98.1 F (36.7 C), temperature source Temporal, height 5' 1"$   (1.549 m), weight 221 lb 3.2 oz (100.3 kg).Body mass index is 41.8 kg/m.  General Appearance: Casual  Eye Contact:  Fair  Speech:  Clear and Coherent  Volume:  Normal  Mood:  Anxious  Affect:  Appropriate  Thought Process:  Goal Directed and Descriptions of Associations: Intact  Orientation:  Full (Time, Place, and Person)  Thought Content: Logical   Suicidal Thoughts:  No  Homicidal Thoughts:  No  Memory:  Immediate;   Fair Recent;   Fair Remote;   Fair  Judgement:  Fair  Insight:  Fair  Psychomotor Activity:  Normal  Concentration:  Concentration: Fair and Attention Span: Fair  Recall:  Fair  Fund of Knowledge: Fair  Language: Fair  Akathisia:  No  Handed:  Right  AIMS (if indicated): not done  Assets:  Communication Skills Desire for Improvement Housing Social Support  ADL's:  Intact  Cognition: WNL  Sleep:  Fair   Screenings: AIMS    Flowsheet Row Admission (Discharged) from 03/07/2019 in [facility] Total Score 0      AUDIT    Flowsheet Row Admission (Discharged) from 03/07/2019 in [facility]  Alcohol Use Disorder Identification Test Final Score (AUDIT) 0      GAD-7    Flowsheet Row Office Visit from 02/11/2023 in [facility] Office Visit from 12/30/2022 in [facility] Office Visit from 11/18/2022 in [facility] Office Visit from 11/07/2022 in [facility] at [facility] Visit from 10/01/2022 in [facility] at [facility]  Total GAD-7 Score 13 14 17 11 10      $ PHQ2-9    [place] Office Visit from 02/11/2023 in [facility] Office Visit from 12/30/2022 in [facility] Office Visit from 11/18/2022 in [facility] Office Visit from 11/07/2022 in [facility] at [facility] Visit from 10/14/2022 in [facility] at [facility] Total Score 2 3 3 2 2  $ PHQ-9 Total Score 13 10 14 10 $ --      [facility] [place] Office Visit from 02/11/2023 in [facility] Office Visit from 12/30/2022 in [facility] Office Visit from 11/18/2022 in [facility] No Risk No Risk No Risk        Assessment and Plan: BERNIDA MCCART is a 32 year old African-American female, currently on Lexapro and mirtazapine with improvement in her mood, possible side effects to Lexapro, discussed plan as noted below.  Plan MDD in partial remission Mirtazapine 30 mg p.o. nightly Lexapro 10 mg p.o. daily with breakfast Provided education, encouraged patient to have a routine to start taking her medication with breakfast daily, at least a snack and to have it ready.  GAD-some improvement Lexapro 10 mg p.o. daily Hydroxyzine 10 mg p.o. 3 times daily as needed for anxiety and sleep Continue CBT with Ms. Tanisha Castellanos  Bereavement-improving Continue CBT  Follow-up in clinic in 4 to 6 weeks or sooner if needed.  This note was generated in part or whole with voice recognition software. Voice recognition is usually quite accurate but there are transcription errors that can and very often do occur. I apologize for any typographical errors that were not detected and corrected.     Ursula Alert, MD 02/12/2023, 8:36 AM

## 2023-02-17 ENCOUNTER — Encounter (INDEPENDENT_AMBULATORY_CARE_PROVIDER_SITE_OTHER): Payer: Self-pay | Admitting: Vascular Surgery

## 2023-02-17 ENCOUNTER — Ambulatory Visit (INDEPENDENT_AMBULATORY_CARE_PROVIDER_SITE_OTHER): Payer: BC Managed Care – PPO | Admitting: Vascular Surgery

## 2023-02-17 VITALS — BP 110/78 | HR 106 | Resp 16 | Wt 222.0 lb

## 2023-02-17 DIAGNOSIS — M7989 Other specified soft tissue disorders: Secondary | ICD-10-CM

## 2023-02-17 DIAGNOSIS — E6609 Other obesity due to excess calories: Secondary | ICD-10-CM

## 2023-02-17 DIAGNOSIS — Z6830 Body mass index (BMI) 30.0-30.9, adult: Secondary | ICD-10-CM

## 2023-02-17 DIAGNOSIS — I890 Lymphedema, not elsewhere classified: Secondary | ICD-10-CM | POA: Diagnosis not present

## 2023-02-17 NOTE — Assessment & Plan Note (Signed)
Symptoms continue to progress despite compression socks and elevation.

## 2023-02-17 NOTE — Progress Notes (Signed)
MRN : [id number]  Mallory Johnston is a 31 y.o. (Jan 31, 1991) female who presents with chief complaint of  Chief Complaint  Patient presents with   Follow-up    27yrfollow up  .  History of Present Illness: Patient returns today in follow up of her lymphedema.  She continues to wear her compression socks daily.  She has previously seen the lymphedema clinic and had manual lymphatic massage in years past.  Despite wearing her compression socks, trying to increase her activity, and elevating her legs now for years, she has developed worsening swelling and hyperpigmentation of the left lower extremity.  Current Outpatient Medications  Medication Sig Dispense Refill   cetirizine (ZYRTEC ALLERGY) 10 MG tablet Take 1 tablet (10 mg total) by mouth daily. 90 tablet 1   drospirenone-ethinyl estradiol (YASMIN 28) 3-0.03 MG tablet Take 1 tablet by mouth daily. 64 tablet 3   escitalopram (LEXAPRO) 10 MG tablet Take 1 tablet (10 mg total) by mouth daily with breakfast. 90 tablet 0   fluticasone (FLONASE) 50 MCG/ACT nasal spray Place 2 sprays into both nostrils daily. 16 g 6   hydrOXYzine (ATARAX) 10 MG tablet TAKE 1 TABLET BY MOUTH 3 TIMES DAILY AS NEEDED FOR ANXIETY. 270 tablet 0   mirtazapine (REMERON) 30 MG tablet Take 1 tablet (30 mg total) by mouth at bedtime. 90 tablet 0   No current facility-administered medications for this visit.    Past Medical History:  Diagnosis Date   Anxiety    Depression    H/O bilateral breast reduction surgery    History of left oophorectomy 2009   due to ovarian cyst   Lymphadenopathy 2009   left leg   Lymphedema of left leg    S/P tonsillectomy    Suicidal ideation 03/06/2019   Von Willebrand disease ([place] 2009    Past Surgical History:  Procedure Laterality Date   BREAST REDUCTION SURGERY  2004   LOWER EXTREMITY VENOGRAPHY Left 09/24/2020   Procedure: LOWER EXTREMITY VENOGRAPHY;  Surgeon: DAlgernon Huxley MD;  [facility];  Service:  Cardiovascular;  Laterality: Left;   OVARIAN CYST REMOVAL     REDUCTION MAMMAPLASTY     TONSILLECTOMY       Social History   Tobacco Use   Smoking status: Never   Smokeless tobacco: Never  Vaping Use   Vaping Use: Never used  Substance Use Topics   Alcohol use: Not Currently    Comment: rare   Drug use: No      Family History  Problem Relation Age of Onset   Depression Mother    Diabetes Mother    Hypertension Father    Breast cancer Neg Hx    Ovarian cancer Neg Hx    Colon cancer Neg Hx      Allergies  Allergen Reactions   Salmon [Fish Oil]    Amoxicillin Rash   Percocet [Oxycodone-Acetaminophen] Nausea And Vomiting and Rash     REVIEW OF SYSTEMS (Negative unless checked)   Constitutional: []$ Weight loss  []$ Fever  []$ Chills Cardiac: []$ Chest pain   []$ Chest pressure   []$ Palpitations   []$ Shortness of breath when laying flat   []$ Shortness of breath at rest   []$ Shortness of breath with exertion. Vascular:  [x]$ Pain in legs with walking   [x]$ Pain in legs at rest   []$ Pain in legs when laying flat   []$ Claudication   []$ Pain in feet when walking  []$ Pain in feet at rest  []$ Pain in feet  when laying flat   []$ History of DVT   []$ Phlebitis   [x]$ Swelling in legs   []$ Varicose veins   []$ Non-healing ulcers Pulmonary:   []$ Uses home oxygen   []$ Productive cough   []$ Hemoptysis   []$ Wheeze  []$ COPD   []$ Asthma Neurologic:  []$ Dizziness  []$ Blackouts   []$ Seizures   []$ History of stroke   []$ History of TIA  []$ Aphasia   []$ Temporary blindness   []$ Dysphagia   []$ Weakness or numbness in arms   []$ Weakness or numbness in legs Musculoskeletal:  []$ Arthritis   []$ Joint swelling   []$ Joint pain   []$ Low back pain Hematologic:  []$ Easy bruising  []$ Easy bleeding   []$ Hypercoagulable state   []$ Anemic  []$ Hepatitis Gastrointestinal:  []$ Blood in stool   []$ Vomiting blood  []$ Gastroesophageal reflux/heartburn   []$ Abdominal pain Genitourinary:  []$ Chronic kidney disease   []$ Difficult urination  []$ Frequent urination  []$ Burning  with urination   []$ Hematuria Skin:  []$ Rashes   []$ Ulcers   []$ Wounds Psychological:  [x]$ History of anxiety   [x]$  History of major depression.    Physical Examination  BP 110/78 (BP Location: Left Arm)   Pulse (!) 106   Resp 16   Wt 222 lb (100.7 kg)   BMI 41.95 kg/m  Gen:  WD/WN, NAD Head: [place]/AT, No temporalis wasting. Ear/Nose/Throat: Hearing grossly intact, nares w/o erythema or drainage Eyes: Conjunctiva clear. Sclera non-icteric Neck: Supple.  Trachea midline Pulmonary:  Good air movement, no use of accessory muscles.  Cardiac: Tachycardic Vascular:  Vessel Right Left  Radial Palpable Palpable                          PT 2+ palpable 1+ palpable  DP 2+ palpable 1+ palpable    Musculoskeletal: M/S 5/5 throughout.  No deformity or atrophy.  1-2+ left lower extremity edema. Neurologic: Sensation grossly intact in extremities.  Symmetrical.  Speech is fluent.  Psychiatric: Judgment intact, Mood & affect appropriate for pt's clinical situation. Dermatologic: No rashes or ulcers noted.  No cellulitis or open wounds.      Labs No results found for this or any previous visit (from the past 2160 hour(s)).  Radiology No results found.  Assessment/Plan Class 1 obesity due to excess calories without serious comorbidity with body mass index (BMI) of 30.0 to 30.9 in adult Weight loss of benefit to improve lower extremity swelling  Swelling of limb Symptoms continue to progress despite compression socks and elevation.  Lymphedema Patient has stage II lymphedema with hyperpigmentation of the skin and swelling refractory to compression socks, exercise, and elevation.  She has been dealing with this for many years.  She has done appropriate conservative therapy for many years.  She has been to the lymphedema clinic.  A lymphedema pump would be an excellent adjuvant therapy to try to improve her symptoms of pain and swelling in the left lower extremity that are bothersome and  nearly disabling on a daily basis.  We will get a lymphedema pump for her in the near future and plan to see her back in 4 to 6 months and follow-up    Leotis Pain, MD  02/17/2023 2:19 PM    This note was created with Dragon medical transcription system.  Any errors from dictation are purely unintentional

## 2023-02-17 NOTE — Assessment & Plan Note (Signed)
Patient has stage II lymphedema with hyperpigmentation of the skin and swelling refractory to compression socks, exercise, and elevation.  She has been dealing with this for many years.  She has done appropriate conservative therapy for many years.  She has been to the lymphedema clinic.  A lymphedema pump would be an excellent adjuvant therapy to try to improve her symptoms of pain and swelling in the left lower extremity that are bothersome and nearly disabling on a daily basis.  We will get a lymphedema pump for her in the near future and plan to see her back in 4 to 6 months and follow-up

## 2023-02-23 ENCOUNTER — Other Ambulatory Visit: Payer: Self-pay | Admitting: Internal Medicine

## 2023-02-23 DIAGNOSIS — F332 Major depressive disorder, recurrent severe without psychotic features: Secondary | ICD-10-CM

## 2023-03-10 ENCOUNTER — Other Ambulatory Visit: Payer: Self-pay | Admitting: Psychiatry

## 2023-03-10 ENCOUNTER — Other Ambulatory Visit: Payer: Self-pay | Admitting: Family

## 2023-03-10 DIAGNOSIS — F331 Major depressive disorder, recurrent, moderate: Secondary | ICD-10-CM

## 2023-03-10 DIAGNOSIS — Z634 Disappearance and death of family member: Secondary | ICD-10-CM

## 2023-03-10 DIAGNOSIS — F419 Anxiety disorder, unspecified: Secondary | ICD-10-CM

## 2023-03-10 DIAGNOSIS — H65191 Other acute nonsuppurative otitis media, right ear: Secondary | ICD-10-CM

## 2023-03-25 ENCOUNTER — Telehealth (INDEPENDENT_AMBULATORY_CARE_PROVIDER_SITE_OTHER): Payer: BC Managed Care – PPO | Admitting: Psychiatry

## 2023-03-25 ENCOUNTER — Encounter: Payer: Self-pay | Admitting: Obstetrics and Gynecology

## 2023-03-25 ENCOUNTER — Ambulatory Visit (INDEPENDENT_AMBULATORY_CARE_PROVIDER_SITE_OTHER): Payer: BC Managed Care – PPO | Admitting: Obstetrics and Gynecology

## 2023-03-25 ENCOUNTER — Encounter: Payer: Self-pay | Admitting: Psychiatry

## 2023-03-25 VITALS — BP 114/78 | HR 92 | Resp 16 | Ht 61.0 in | Wt 221.0 lb

## 2023-03-25 DIAGNOSIS — F3342 Major depressive disorder, recurrent, in full remission: Secondary | ICD-10-CM

## 2023-03-25 DIAGNOSIS — N926 Irregular menstruation, unspecified: Secondary | ICD-10-CM

## 2023-03-25 DIAGNOSIS — F411 Generalized anxiety disorder: Secondary | ICD-10-CM

## 2023-03-25 DIAGNOSIS — Z634 Disappearance and death of family member: Secondary | ICD-10-CM

## 2023-03-25 DIAGNOSIS — E282 Polycystic ovarian syndrome: Secondary | ICD-10-CM | POA: Diagnosis not present

## 2023-03-25 MED ORDER — MIRTAZAPINE 30 MG PO TABS: 30.0000 mg | ORAL_TABLET | Freq: Every day | ORAL | 0 refills | Status: DC

## 2023-03-25 MED ORDER — DROSPIRENONE-ETHINYL ESTRADIOL 3-0.03 MG PO TABS: 1.0000 | ORAL_TABLET | Freq: Every day | ORAL | 3 refills | Status: DC

## 2023-03-25 MED ORDER — ESCITALOPRAM OXALATE 10 MG PO TABS: 10.0000 mg | ORAL_TABLET | Freq: Every day | ORAL | 0 refills | Status: DC

## 2023-03-25 NOTE — Progress Notes (Signed)
    GYNECOLOGY PROGRESS NOTE  Subjective:    Patient ID: Mallory Johnston, female    DOB: [birth year], 32 y.o.   MRN: [id number]  HPI  Patient is a 32 y.o. [id number] female who presents for follow up on birth control. She was put on Yas three months ago to help manage PCOS and irregular menses. She reports with the Yas her cycles have been less painful than usual and her menstrual flow isn't as heavy as it was before. She would like to continue to take this medication to manage her symptoms. Denies any undesirable side effects.   Menstrual History: Patient's last menstrual period was 03/08/2023.  Period Cycle (Days): 28 Period Duration (Days): 7 Period Pattern: (!) Irregular Menstrual Flow: Light, Moderate Menstrual Control: Maxi pad Menstrual Control Change Freq (Hours): 2-3 Dysmenorrhea: (!) Moderate Dysmenorrhea Symptoms: Cramping, Diarrhea   The following portions of the patient's history were reviewed and updated as appropriate: allergies, current medications, past family history, past medical history, past social history, past surgical history, and problem list.  Review of Systems Pertinent items noted in HPI and remainder of comprehensive ROS otherwise negative.   Objective:   Blood pressure 114/78, pulse 92, resp. rate 16, height 5\' 1"  (1.549 m), weight 221 lb (100.2 kg), last menstrual period 03/08/2023. Body mass index is 41.76 kg/m. General appearance: alert, cooperative, and no distress Remainder of exam deferred.   Assessment:   1. PCOS (polycystic ovarian syndrome)   2. Irregular menses      Plan:   - Doing well on current OCP.  Desires to continue. Will fill for 1 year. Return to clinic for any scheduled appointments or for any gynecologic concerns as needed.     Rubie Maid, MD [place]

## 2023-03-25 NOTE — Progress Notes (Signed)
Virtual Visit via Video Note  I connected with Mallory Johnston on 03/25/23 at  9:30 AM EDT by a video enabled telemedicine application and verified that I am speaking with the correct person using two identifiers.  Location Provider Location : [facility] Patient Location : Home  Participants: Patient , Provider   I discussed the limitations of evaluation and management by telemedicine and the availability of in person appointments. The patient expressed understanding and agreed to proceed.   I discussed the assessment and treatment plan with the patient. The patient was provided an opportunity to ask questions and all were answered. The patient agreed with the plan and demonstrated an understanding of the instructions.   The patient was advised to call back or seek an in-person evaluation if the symptoms worsen or if the condition fails to improve as anticipated.  [place] MD OP Progress Note  03/25/2023 9:56 AM AAMYAH ASSI  MRN:  [id number]  Chief Complaint:  Chief Complaint  Patient presents with   Follow-up   Medication Refill   Depression   Grief   HPI: Mallory Johnston is a 32 year old African-American female, employed, lives in [place], has a history of depression, bereavement, anxiety, von Willebrand's disease, PCOS was evaluated by telemedicine today.  Patient today reports overall she is improving with regards to her depression symptoms.  She reports she currently lives at her new residence and that has tremendously helped.  She continues to grieve the loss of her grandmother.  She however reports she is motivated to stay in therapy.  Patient reports sleep has improved.  Mostly compliant on mirtazapine unless she falls asleep without taking it.  She reports she is worried about taking it in the middle of the night since she does not want to feel too groggy the next day.  Denies side effects.  Compliant on the Lexapro.  Denies side effects.  Reports she has learned coping  strategies with regard to dealing with her work-related stresses.  And that has helped.  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  All that has helped her to feel better.  Patient denies any suicidality, homicidality or perceptual disturbances.  Patient denies any other concerns today.  Visit Diagnosis:    ICD-10-CM   1. MDD (major depressive disorder), recurrent, in full remission ([facility])  F33.42     2. Bereavement  Z63.4     3. Generalized anxiety disorder  F41.1 mirtazapine (REMERON) 30 MG tablet    escitalopram (LEXAPRO) 10 MG tablet      Past Psychiatric History: Reviewed past psychiatric history from progress note on 11/18/2022.  Past trials of trazodone, zolpidem.  Past Medical History:  Past Medical History:  Diagnosis Date   Anxiety    Depression    H/O bilateral breast reduction surgery    History of left oophorectomy 2009   due to ovarian cyst   Lymphadenopathy 2009   left leg   Lymphedema of left leg    S/P tonsillectomy    Suicidal ideation 03/06/2019   Von Willebrand disease ([facility]) 2009    Past Surgical History:  Procedure Laterality Date   BREAST REDUCTION SURGERY  2004   LOWER EXTREMITY VENOGRAPHY Left 09/24/2020   Procedure: LOWER EXTREMITY VENOGRAPHY;  Surgeon: Algernon Huxley, MD;  Location: [facility];  Service: Cardiovascular;  Laterality: Left;   OVARIAN CYST REMOVAL     REDUCTION MAMMAPLASTY  TONSILLECTOMY      Family Psychiatric History: Reviewed family psychiatric history from progress note on 11/18/2022.  Family History:  Family History  Problem Relation Age of Onset   Depression Mother    Diabetes Mother    Hypertension Father    Breast cancer Neg Hx    Ovarian cancer Neg Hx    Colon cancer Neg Hx     Social History: Reviewed social history from progress note on 11/18/2022. Social History   Socioeconomic History   Marital status: Single    Spouse name: Not on file   Number of  children: Not on file   Years of education: Not on file   Highest education level: Some college, no degree  Occupational History   Not on file  Tobacco Use   Smoking status: Never   Smokeless tobacco: Never  Vaping Use   Vaping Use: Never used  Substance and Sexual Activity   Alcohol use: Not Currently    Comment: rare   Drug use: No   Sexual activity: Not Currently    Birth control/protection: None  Other Topics Concern   Not on file  Social History Narrative   Not on file   Social Determinants of Health   Financial Resource Strain: Low Risk  (11/07/2022)   Overall Financial Resource Strain (CARDIA)    Difficulty of Paying Living Expenses: Not hard at all  Food Insecurity: No Food Insecurity (11/07/2022)   Hunger Vital Sign    Worried About Running Out of Food in the Last Year: Never true    [place] in the Last Year: Never true  Transportation Needs: No Transportation Needs (11/07/2022)   PRAPARE - Hydrologist (Medical): No    Lack of Transportation (Non-Medical): No  Physical Activity: Not on file  Stress: Not on file  Social Connections: Not on file    Allergies:  Allergies  Allergen Reactions   Salmon [Fish Oil]    Amoxicillin Rash   Percocet [Oxycodone-Acetaminophen] Nausea And Vomiting and Rash    Metabolic Disorder Labs: Lab Results  Component Value Date   HGBA1C 5.3 03/08/2019   MPG 105.41 03/08/2019   No results found for: "PROLACTIN" Lab Results  Component Value Date   CHOL 148 03/08/2019   TRIG 115 03/08/2019   HDL 52 03/08/2019   CHOLHDL 2.8 03/08/2019   VLDL 23 03/08/2019   LDLCALC 73 03/08/2019   Lab Results  Component Value Date   TSH 0.989 11/18/2022   TSH 1.201 03/08/2019    Therapeutic Level Labs: No results found for: "LITHIUM" No results found for: "VALPROATE" No results found for: "CBMZ"  Current Medications: Current Outpatient Medications  Medication Sig Dispense Refill   cetirizine  (ZYRTEC ALLERGY) 10 MG tablet Take 1 tablet (10 mg total) by mouth daily. 90 tablet 1   drospirenone-ethinyl estradiol (YASMIN 28) 3-0.03 MG tablet Take 1 tablet by mouth daily. 64 tablet 3   fluticasone (FLONASE) 50 MCG/ACT nasal spray Place 2 sprays into both nostrils daily. 16 g 6   hydrOXYzine (ATARAX) 10 MG tablet TAKE 1 TABLET BY MOUTH THREE TIMES A DAY AS NEEDED FOR ANXIETY 270 tablet 0   escitalopram (LEXAPRO) 10 MG tablet Take 1 tablet (10 mg total) by mouth daily with breakfast. 90 tablet 0   mirtazapine (REMERON) 30 MG tablet Take 1 tablet (30 mg total) by mouth at bedtime. 90 tablet 0   No current facility-administered medications for this visit.  Musculoskeletal: Strength & Muscle Tone:  UTA Gait & Station:  Seated Patient leans: N/A  Psychiatric Specialty Exam: Review of Systems  Psychiatric/Behavioral:  The patient is nervous/anxious.        Grief  All other systems reviewed and are negative.   There were no vitals taken for this visit.There is no height or weight on file to calculate BMI.  General Appearance: Casual  Eye Contact:  Fair  Speech:  Clear and Coherent  Volume:  Normal  Mood:  Anxious, grief  Affect:  Appropriate  Thought Process:  Goal Directed and Descriptions of Associations: Intact  Orientation:  Full (Time, Place, and Person)  Thought Content: Logical   Suicidal Thoughts:  No  Homicidal Thoughts:  No  Memory:  Immediate;   Fair Recent;   Fair Remote;   Fair  Judgement:  Fair  Insight:  Fair  Psychomotor Activity:  Normal  Concentration:  Concentration: Fair and Attention Span: Fair  Recall:  [employer] of Knowledge: Fair  Language: Fair  Akathisia:  No  Handed:  Right  AIMS (if indicated): not done  Assets:  Communication Skills Desire for Improvement Housing Social Support  ADL's:  Intact  Cognition: WNL  Sleep:   improving   Screenings: AIMS    Flowsheet Row Admission (Discharged) from 03/07/2019 in [facility] Total Score 0      AUDIT    [facility] Admission (Discharged) from 03/07/2019 in [facility]  Alcohol Use Disorder Identification Test Final Score (AUDIT) 0      GAD-7    Flowsheet Row Office Visit from 02/11/2023 in [facility] Office Visit from 12/30/2022 in [facility] Office Visit from 11/18/2022 in [facility] Office Visit from 11/07/2022 in [facility] Visit from 10/01/2022 in [facility] at [facility]  Total GAD-7 Score 13 14 17 11 10       PHQ2-9    Flowsheet Row Video Visit from 03/25/2023 in [facility] Office Visit from 02/11/2023 in [facility] Office Visit from 12/30/2022 in [facility] Office Visit from 11/18/2022 in [facility] Office Visit from 11/07/2022 in [facility]  PHQ-2 Total Score 0 2 3 3 2   PHQ-9 Total Score -- 13 10 14 10       Flowsheet Row Video Visit from 03/25/2023 in [facility] Office Visit from 02/11/2023 in [facility] Office Visit from 12/30/2022 in [facility] CATEGORY Low Risk No Risk No Risk        Assessment and Plan: Mallory Johnston is a 32 year old African-American female, with MDD, GAD and bereavement, currently improving, will benefit from following plan.  Plan MDD in remission Mirtazapine 30 mg p.o. nightly Lexapro 10 mg p.o. daily with breakfast  GAD-improving Lexapro 10 mg p.o. daily Hydroxyzine 10 mg p.o. 3 times daily as needed for  anxiety and sleep Continues to EBT with Ms. Tanisha Castellanos  Bereavement-improving Continue CBT  Follow-up in clinic in 3 months or sooner if needed.   Collaboration of Care: Collaboration of Care: Referral or follow-up with counselor/therapist AEB patient encouraged to follow up with therapist.  Patient/Guardian was advised Release of Information must be obtained prior to any record release in order to collaborate their care with an outside provider. Patient/Guardian was advised if they have not already done so to contact the registration department to sign all necessary forms in order for [place] to release information regarding their care.   Consent: Patient/Guardian gives verbal consent for treatment and assignment of benefits for services provided during this visit. Patient/Guardian expressed understanding and agreed to proceed.   This note was generated in part or whole with voice recognition software. Voice recognition is usually quite accurate but there are transcription errors that can and very often do occur. I apologize for any typographical errors that were not detected and corrected.    Ursula Alert, MD 03/25/2023, 9:56 AM

## 2023-05-07 ENCOUNTER — Ambulatory Visit (INDEPENDENT_AMBULATORY_CARE_PROVIDER_SITE_OTHER): Payer: BC Managed Care – PPO | Admitting: Podiatry

## 2023-05-07 ENCOUNTER — Encounter: Payer: Self-pay | Admitting: Podiatry

## 2023-05-07 VITALS — BP 120/79 | HR 97

## 2023-05-07 DIAGNOSIS — M7752 Other enthesopathy of left foot: Secondary | ICD-10-CM

## 2023-05-07 DIAGNOSIS — S93402A Sprain of unspecified ligament of left ankle, initial encounter: Secondary | ICD-10-CM | POA: Diagnosis not present

## 2023-05-07 NOTE — Progress Notes (Signed)
Subjective:  Patient ID: Mallory Johnston, female    DOB: June 17, 1991,  MRN: [id number]  Chief Complaint  Patient presents with   Foot Pain    "It has it's days."    32 y.o. female presents with the above complaint.  Patient presents with continuous left lateral ankle pain.  She states that doing good.  But her pain started coming back again wanted to get it evaluated denies any other acute complaints   Review of Systems: Negative except as noted in the HPI. Denies N/V/F/Ch.  Past Medical History:  Diagnosis Date   H/O bilateral breast reduction surgery    Lymphadenopathy 2009   left leg   Lymphedema of left leg    Ovarian cyst    S/P tonsillectomy    Suicidal ideation 03/06/2019    Current Outpatient Medications:    KARIVA 0.15-0.02/0.01 MG (21/5) tablet, TAKE 1 TABLET BY MOUTH EVERYDAY AT BEDTIME, Disp: 84 tablet, Rfl: 0   Levonorgestrel-Ethinyl Estradiol (AMETHIA) 0.15-0.03 &0.01 MG tablet, Take 1 tablet by mouth at bedtime., Disp: 84 tablet, Rfl: 1   meclizine (ANTIVERT) 12.5 MG tablet, Take 1 tablet (12.5 mg total) by mouth at bedtime., Disp: 30 tablet, Rfl: 0   mirtazapine (REMERON) 30 MG tablet, Take 1 tablet (30 mg total) by mouth at bedtime., Disp: 30 tablet, Rfl: 3   zolpidem (AMBIEN) 5 MG tablet, Take 5 mg by mouth at bedtime as needed., Disp: , Rfl:   Social History   Tobacco Use  Smoking Status Never  Smokeless Tobacco Never    Allergies  Allergen Reactions   Amoxicillin Rash   Percocet [Oxycodone-Acetaminophen] Nausea And Vomiting and Rash   Objective:  There were no vitals filed for this visit. There is no height or weight on file to calculate BMI. Constitutional Well developed. Well nourished.  Vascular Dorsalis pedis pulses palpable bilaterally. Posterior tibial pulses palpable bilaterally. Capillary refill normal to all digits.  No cyanosis or clubbing noted. Pedal hair growth normal.  Neurologic Normal speech. Oriented to person, place, and  time. Epicritic sensation to light touch grossly present bilaterally.  Dermatologic Nails well groomed and normal in appearance. No open wounds. No skin lesions.  Orthopedic:  Moderate pain is now localized more to the left lateral ankle mild pain with dorsiflexion plantarflexion resisted.  Moderate pain to the midfoot.  There is still edema present nonpitting to the left lower extremity.  Patient has a semiflexible pes cavus foot structure.  No hammertoe contractures noted.  Tight Achilles noted.   Radiographs: No fracture or malalignment. Mild chronic sclerosis base of fifth proximal phalanx. The joint spaces appear within normal limits. There is no evidence of fracture, dislocation, or joint effusion. There is no evidence of arthropathy or other focal bone abnormality. Soft tissues are unremarkable.  1. Apparent lateral midfoot degenerative changes which could be posttraumatic in etiology. No subluxation at the Lisfranc joint identified. The Lisfranc ligament appears intact. 2. No talar dome osteochondral lesion, tibiotalar or hindfoot arthropathy identified. 3. The ankle tendons and ligaments appear intact. Assessment:   No diagnosis found.   Plan:  Patient was evaluated and treated and all questions answered.  Left lateral ankle sprain/ATFL ligament with underlying capsulitis -Given that she still continues to have pain I believe she will benefit from another steroid injection of decrease acute inflammatory component associate with pain.  She is unable to transition out of the boot into regular shoes.  Patient agrees plan would like to try another steroid injection -A steroid  injection was performed at right ankle joint using 1% plain Lidocaine and 10 mg of Kenalog. This was well tolerated. -Tri-Lock ankle brace was dispensed for stability.  If there is continuous issue patient will need surgical intervention  Pes cavus -I explained to the patient the etiology of pes cavus foot  structure and various treatment options were discussed.  Given that she is having generalized pain to the lower extremity likely due to no support in the shoes I believe she will benefit from orthotics.  -Orthotics were dispensed and are functioning well ligament tear  No follow-ups on file.

## 2023-05-12 ENCOUNTER — Encounter: Payer: Self-pay | Admitting: Podiatry

## 2023-06-18 ENCOUNTER — Ambulatory Visit: Payer: BC Managed Care – PPO | Admitting: Podiatry

## 2023-06-24 ENCOUNTER — Other Ambulatory Visit: Payer: Self-pay | Admitting: Psychiatry

## 2023-06-24 DIAGNOSIS — F411 Generalized anxiety disorder: Secondary | ICD-10-CM

## 2023-06-30 ENCOUNTER — Encounter: Payer: Self-pay | Admitting: Psychiatry

## 2023-06-30 ENCOUNTER — Telehealth (INDEPENDENT_AMBULATORY_CARE_PROVIDER_SITE_OTHER): Payer: Self-pay | Admitting: Psychiatry

## 2023-06-30 DIAGNOSIS — F411 Generalized anxiety disorder: Secondary | ICD-10-CM

## 2023-06-30 DIAGNOSIS — F3342 Major depressive disorder, recurrent, in full remission: Secondary | ICD-10-CM

## 2023-06-30 DIAGNOSIS — Z634 Disappearance and death of family member: Secondary | ICD-10-CM

## 2023-06-30 NOTE — Progress Notes (Signed)
Virtual Visit via Video Note  I connected with Mallory Johnston on 06/30/23 at 10:00 AM EDT by a video enabled telemedicine application and verified that I am speaking with the correct person using two identifiers.  Location Provider Location : [facility] Patient Location : Car  Participants: Patient , Provider    I discussed the limitations of evaluation and management by telemedicine and the availability of in person appointments. The patient expressed understanding and agreed to proceed.   I discussed the assessment and treatment plan with the patient. The patient was provided an opportunity to ask questions and all were answered. The patient agreed with the plan and demonstrated an understanding of the instructions.   The patient was advised to call back or seek an in-person evaluation if the symptoms worsen or if the condition fails to improve as anticipated.   BH MD OP Progress Note  06/30/2023 12:55 PM Mallory Johnston  MRN:  [id number]  Chief Complaint:  Chief Complaint  Patient presents with   Follow-up   Anxiety   Medication Refill   HPI: Mallory Johnston is a 32 year old African-American female, employed, lives in [place], has a history of depression, bereavement, anxiety, von Willebrand's disease, PCOS was evaluated by telemedicine today.  Patient today reports she is currently at a new job since her previous company shut down.  She is currently in training and that has been anxiety provoking.  She is also having interpersonal conflicts with her new boss.  She however reports overall she is trying to cope.  She had an appointment with her therapist recently and is planning to see her therapist once a month for now.  Patient continues to have anxiety mostly triggered by her new job.  Reports she has been noncompliant with Lexapro since she does not take her breakfast daily she does not take it every day.  She does take her mirtazapine daily at bedtime.  Denies side effects.   Willing to start taking her Lexapro with supper to see if that will help with adherence.  Patient denies any suicidality, homicidality or perceptual disturbances.  Patient denies any other concerns today.  Visit Diagnosis:    ICD-10-CM   1. MDD (major depressive disorder), recurrent, in full remission (HCC)  F33.42     2. Bereavement  Z63.4     3. Generalized anxiety disorder  F41.1       Past Psychiatric History: I have reviewed past psychiatric history from progress note on 11/18/2022.  Past trials of trazodone, zolpidem.  Past Medical History:  Past Medical History:  Diagnosis Date   Anxiety    Depression    H/O bilateral breast reduction surgery    History of left oophorectomy 2009   due to ovarian cyst   Lymphadenopathy 2009   left leg   Lymphedema of left leg    S/P tonsillectomy    Suicidal ideation 03/06/2019   Von Willebrand disease (HCC) 2009    Past Surgical History:  Procedure Laterality Date   BREAST REDUCTION SURGERY  2004   LOWER EXTREMITY VENOGRAPHY Left 09/24/2020   Procedure: LOWER EXTREMITY VENOGRAPHY;  Surgeon: Annice Needy, MD;  Location: [facility] INVASIVE CV LAB;  Service: Cardiovascular;  Laterality: Left;   OVARIAN CYST REMOVAL     REDUCTION MAMMAPLASTY     TONSILLECTOMY      Family Psychiatric History: Reviewed family psychiatric history from progress note on 11/18/2022.  Family History:  Family History  Problem Relation Age of Onset   Depression  Mother    Diabetes Mother    Hypertension Father    Breast cancer Neg Hx    Ovarian cancer Neg Hx    Colon cancer Neg Hx     Social History: Reviewed social history from progress note on 11/18/2022. Social History   Socioeconomic History   Marital status: Single    Spouse name: Not on file   Number of children: Not on file   Years of education: Not on file   Highest education level: Some college, no degree  Occupational History   Not on file  Tobacco Use   Smoking status: Never    Smokeless tobacco: Never  Vaping Use   Vaping Use: Never used  Substance and Sexual Activity   Alcohol use: Not Currently    Comment: rare   Drug use: No   Sexual activity: Not Currently    Birth control/protection: None  Other Topics Concern   Not on file  Social History Narrative   Not on file   Social Determinants of Health   Financial Resource Strain: Low Risk  (11/07/2022)   Overall Financial Resource Strain (CARDIA)    Difficulty of Paying Living Expenses: Not hard at all  Food Insecurity: No Food Insecurity (11/07/2022)   Hunger Vital Sign    Worried About Running Out of Food in the Last Year: Never true    Ran Out of Food in the Last Year: Never true  Transportation Needs: No Transportation Needs (11/07/2022)   PRAPARE - Administrator, Civil Service (Medical): No    Lack of Transportation (Non-Medical): No  Physical Activity: Not on file  Stress: Not on file  Social Connections: Not on file    Allergies:  Allergies  Allergen Reactions   Salmon [Fish Oil]    Amoxicillin Rash   Percocet [Oxycodone-Acetaminophen] Nausea And Vomiting and Rash    Metabolic Disorder Labs: Lab Results  Component Value Date   HGBA1C 5.3 03/08/2019   MPG 105.41 03/08/2019   No results found for: "PROLACTIN" Lab Results  Component Value Date   CHOL 148 03/08/2019   TRIG 115 03/08/2019   HDL 52 03/08/2019   CHOLHDL 2.8 03/08/2019   VLDL 23 03/08/2019   LDLCALC 73 03/08/2019   Lab Results  Component Value Date   TSH 0.989 11/18/2022   TSH 1.201 03/08/2019    Therapeutic Level Labs: No results found for: "LITHIUM" No results found for: "VALPROATE" No results found for: "CBMZ"  Current Medications: Current Outpatient Medications  Medication Sig Dispense Refill   cetirizine (ZYRTEC ALLERGY) 10 MG tablet Take 1 tablet (10 mg total) by mouth daily. 90 tablet 1   drospirenone-ethinyl estradiol (YASMIN 28) 3-0.03 MG tablet Take 1 tablet by mouth daily. 84 tablet  3   escitalopram (LEXAPRO) 10 MG tablet TAKE 1 TABLET (10 MG TOTAL) BY MOUTH DAILY WITH BREAKFAST. 90 tablet 0   fluticasone (FLONASE) 50 MCG/ACT nasal spray Place 2 sprays into both nostrils daily. 16 g 6   hydrOXYzine (ATARAX) 10 MG tablet TAKE 1 TABLET BY MOUTH THREE TIMES A DAY AS NEEDED FOR ANXIETY 270 tablet 0   mirtazapine (REMERON) 30 MG tablet TAKE 1 TABLET BY MOUTH AT BEDTIME. 90 tablet 0   No current facility-administered medications for this visit.     Musculoskeletal: Strength & Muscle Tone:  UTA Gait & Station:  Seated Patient leans: N/A  Psychiatric Specialty Exam: Review of Systems  Psychiatric/Behavioral:  The patient is nervous/anxious.     There were  no vitals taken for this visit.There is no height or weight on file to calculate BMI.  General Appearance: Casual  Eye Contact:  Good  Speech:  Clear and Coherent  Volume:  Normal  Mood:  Anxious  Affect:  Appropriate  Thought Process:  Goal Directed and Descriptions of Associations: Intact  Orientation:  Full (Time, Place, and Person)  Thought Content: Logical   Suicidal Thoughts:  No  Homicidal Thoughts:  No  Memory:  Immediate;   Fair Recent;   Fair Remote;   Fair  Judgement:  Fair  Insight:  Fair  Psychomotor Activity:  Normal  Concentration:  Concentration: Good and Attention Span: Good  Recall:  Good  Fund of Knowledge: Fair  Language: Good  Akathisia:  No  Handed:  Right  AIMS (if indicated): not done  Assets:  Communication Skills Desire for Improvement Housing Social Support  ADL's:  Intact  Cognition: WNL  Sleep:  Fair   Screenings: AIMS    Flowsheet Row Admission (Discharged) from 03/07/2019 in [facility] INPATIENT BEHAVIORAL MEDICINE  AIMS Total Score 0      AUDIT    Flowsheet Row Admission (Discharged) from 03/07/2019 in [facility] INPATIENT BEHAVIORAL MEDICINE  Alcohol Use Disorder Identification Test Final Score (AUDIT) 0      GAD-7    Flowsheet Row Video Visit from 06/30/2023 in [facility] Office Visit from 02/11/2023 in [facility] Office Visit from 12/30/2022 in [facility] Office Visit from 11/18/2022 in [facility] Office Visit from 11/07/2022 in [facility] at [facility]  Total GAD-7 Score 8 13 14 17 11       PHQ2-9    Flowsheet Row Video Visit from 06/30/2023 in [facility] Video Visit from 03/25/2023 in [facility] Psychiatric Associates Office Visit from 02/11/2023 in [facility] Office Visit from 12/30/2022 in [facility] Office Visit from 11/18/2022 in [facility]  PHQ-2 Total Score 0 0 2 3 3   PHQ-9 Total Score -- -- 13 10 14       Flowsheet Row Video Visit from 06/30/2023 in [facility] Associates Video Visit from 03/25/2023 in [facility] Office Visit from 02/11/2023 in [facility]  C-SSRS RISK CATEGORY No Risk Low Risk No Risk        Assessment and Plan: KARSYNN PINKHAM is a 32 year old African-American female with MDD, GAD, bereavement, currently with recent job change which has been anxiety provoking, noncompliance with Lexapro, will benefit from the following plan.  Plan MDD in remission Mirtazapine 30 mg p.o. nightly Lexapro 10 mg p.o. daily with supper.  GAD-improving Patient advised to be compliant on Lexapro, could change to Lexapro 10 mg p.o. daily with supper Hydroxyzine 10 mg p.o. 3 times daily as needed for anxiety Continue CBT with Ms. Tanisha Castellanos  Bereavement-improving Continue CBT  Follow-up in clinic in 2 months or sooner if needed.  Collaboration of  Care: Collaboration of Care: Referral or follow-up with counselor/therapist AEB encouraged to continue CBT  Patient/Guardian was advised Release of Information must be obtained prior to any record release in order to collaborate their care with an outside provider. Patient/Guardian was advised if they have not already done so to contact the registration department to sign all necessary  forms in order for [place] to release information regarding their care.   Consent: Patient/Guardian gives verbal consent for treatment and assignment of benefits for services provided during this visit. Patient/Guardian expressed understanding and agreed to proceed.   This note was generated in part or whole with voice recognition software. Voice recognition is usually quite accurate but there are transcription errors that can and very often do occur. I apologize for any typographical errors that were not detected and corrected.      Jomarie Longs, MD 06/30/2023, 12:55 PM

## 2023-07-15 ENCOUNTER — Encounter: Payer: Self-pay | Admitting: Obstetrics and Gynecology

## 2023-09-10 ENCOUNTER — Telehealth (INDEPENDENT_AMBULATORY_CARE_PROVIDER_SITE_OTHER): Payer: BC Managed Care – PPO | Admitting: Psychiatry

## 2023-09-10 ENCOUNTER — Encounter: Payer: Self-pay | Admitting: Psychiatry

## 2023-09-10 DIAGNOSIS — F3342 Major depressive disorder, recurrent, in full remission: Secondary | ICD-10-CM

## 2023-09-10 DIAGNOSIS — Z634 Disappearance and death of family member: Secondary | ICD-10-CM | POA: Diagnosis not present

## 2023-09-10 DIAGNOSIS — F411 Generalized anxiety disorder: Secondary | ICD-10-CM | POA: Diagnosis not present

## 2023-09-10 MED ORDER — ESCITALOPRAM OXALATE 10 MG PO TABS: 10.0000 mg | ORAL_TABLET | Freq: Every day | ORAL | 1 refills | Status: DC

## 2023-09-10 MED ORDER — MIRTAZAPINE 30 MG PO TABS: 30.0000 mg | ORAL_TABLET | Freq: Every day | ORAL | 1 refills | Status: DC

## 2023-09-10 MED ORDER — HYDROXYZINE HCL 10 MG PO TABS: 10.0000 mg | ORAL_TABLET | Freq: Three times a day (TID) | ORAL | 0 refills | Status: DC | PRN

## 2023-09-10 NOTE — Progress Notes (Signed)
Virtual Visit via Video Note  I connected with Mallory Johnston on 09/10/23 at  1:00 PM EDT by a video enabled telemedicine application and verified that I am speaking with the correct person using two identifiers.  Location Provider Location : [facility] Patient Location : Home  Participants: Patient , Provider    I discussed the limitations of evaluation and management by telemedicine and the availability of in person appointments. The patient expressed understanding and agreed to proceed.   I discussed the assessment and treatment plan with the patient. The patient was provided an opportunity to ask questions and all were answered. The patient agreed with the plan and demonstrated an understanding of the instructions.   The patient was advised to call back or seek an in-person evaluation if the symptoms worsen or if the condition fails to improve as anticipated.    BH MD OP Progress Note  09/11/2023 7:58 AM Mallory Johnston  MRN:  [id number]  Chief Complaint:  Chief Complaint  Patient presents with   Follow-up   Anxiety   Depression   Medication Refill   HPI: Mallory Johnston is a 32 year old African-American female, employed, lives in [place], has a history of MDD, bereavement, GAD, von Willebrand disease, PCOS was evaluated by telemedicine today.  Patient today reports she continues to struggle with job related stressors.  She is not very happy with her new job.  She reports she feels as though she is at her job all day and does not have any time to spend with her mother.  She reports because of the way her shift is she does not get any free time during the day at all.  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  Looks forward to that.  She does struggle with anxiety, worrying about things and ups and downs in her mood.  However she reports she is coping overall okay.  Denies any concerns with medications.  Reports she  would like to stay on the current dosage.  She denies side effects.  She reports sleep as restless.  She does not have a good sleep hygiene and watches TV or play on her phone prior to her bedtime.  Agreeable to start working on sleep hygiene like switching off electronic devices, setting a set bedtime and wake-up time.  Patient is not interested in the addition of a sleep medication at this time.  She will let writer know if she is interested.  She does have hydroxyzine available which she agrees to take as needed.  Patient reports she is not in therapy anymore since she is unable to afford her previous therapist.  She is interested in finding a new therapist who is in network.  She denies any suicidality, homicidality or perceptual disturbances.  Patient denies any other concerns today.  Visit Diagnosis:    ICD-10-CM   1. MDD (major depressive disorder), recurrent, in full remission (HCC)  F33.42     2. Bereavement  Z63.4 hydrOXYzine (ATARAX) 10 MG tablet    3. Generalized anxiety disorder  F41.1 mirtazapine (REMERON) 30 MG tablet    escitalopram (LEXAPRO) 10 MG tablet      Past Psychiatric History: I have reviewed past psychiatric history from progress note on 11/18/2022.  Past trials of trazodone, zolpidem.  Past Medical History:  Past Medical History:  Diagnosis Date   Anxiety    Depression    H/O bilateral breast reduction surgery  History of left oophorectomy 2009   due to ovarian cyst   Lymphadenopathy 2009   left leg   Lymphedema of left leg    S/P tonsillectomy    Suicidal ideation 03/06/2019   Von Willebrand disease (HCC) 2009    Past Surgical History:  Procedure Laterality Date   BREAST REDUCTION SURGERY  2004   LOWER EXTREMITY VENOGRAPHY Left 09/24/2020   Procedure: LOWER EXTREMITY VENOGRAPHY;  Surgeon: Annice Needy, MD;  Location: [facility] INVASIVE CV LAB;  Service: Cardiovascular;  Laterality: Left;   OVARIAN CYST REMOVAL     REDUCTION MAMMAPLASTY      TONSILLECTOMY      Family Psychiatric History: I have reviewed family psychiatric history from progress note on 11/18/2022.  Family History:  Family History  Problem Relation Age of Onset   Depression Mother    Diabetes Mother    Hypertension Father    Breast cancer Neg Hx    Ovarian cancer Neg Hx    Colon cancer Neg Hx     Social History: I have reviewed social history from progress note on 11/18/2022. Social History   Socioeconomic History   Marital status: Single    Spouse name: Not on file   Number of children: Not on file   Years of education: Not on file   Highest education level: Some college, no degree  Occupational History   Not on file  Tobacco Use   Smoking status: Never   Smokeless tobacco: Never  Vaping Use   Vaping status: Never Used  Substance and Sexual Activity   Alcohol use: Not Currently    Comment: rare   Drug use: No   Sexual activity: Not Currently    Birth control/protection: None  Other Topics Concern   Not on file  Social History Narrative   Not on file   Social Determinants of Health   Financial Resource Strain: Low Risk  (11/07/2022)   Overall Financial Resource Strain (CARDIA)    Difficulty of Paying Living Expenses: Not hard at all  Food Insecurity: No Food Insecurity (11/07/2022)   Hunger Vital Sign    Worried About Running Out of Food in the Last Year: Never true    Ran Out of Food in the Last Year: Never true  Transportation Needs: No Transportation Needs (11/07/2022)   PRAPARE - Administrator, Civil Service (Medical): No    Lack of Transportation (Non-Medical): No  Physical Activity: Not on file  Stress: Not on file  Social Connections: Not on file    Allergies:  Allergies  Allergen Reactions   Salmon [Fish Oil]    Amoxicillin Rash   Percocet [Oxycodone-Acetaminophen] Nausea And Vomiting and Rash    Metabolic Disorder Labs: Lab Results  Component Value Date   HGBA1C 5.3 03/08/2019   MPG 105.41  03/08/2019   No results found for: "PROLACTIN" Lab Results  Component Value Date   CHOL 148 03/08/2019   TRIG 115 03/08/2019   HDL 52 03/08/2019   CHOLHDL 2.8 03/08/2019   VLDL 23 03/08/2019   LDLCALC 73 03/08/2019   Lab Results  Component Value Date   TSH 0.989 11/18/2022   TSH 1.201 03/08/2019    Therapeutic Level Labs: No results found for: "LITHIUM" No results found for: "VALPROATE" No results found for: "CBMZ"  Current Medications: Current Outpatient Medications  Medication Sig Dispense Refill   cetirizine (ZYRTEC ALLERGY) 10 MG tablet Take 1 tablet (10 mg total) by mouth daily. 90 tablet 1  drospirenone-ethinyl estradiol (YASMIN 28) 3-0.03 MG tablet Take 1 tablet by mouth daily. 84 tablet 3   fluticasone (FLONASE) 50 MCG/ACT nasal spray Place 2 sprays into both nostrils daily. 16 g 6   escitalopram (LEXAPRO) 10 MG tablet Take 1 tablet (10 mg total) by mouth daily with breakfast. 90 tablet 1   hydrOXYzine (ATARAX) 10 MG tablet Take 1 tablet (10 mg total) by mouth 3 (three) times daily as needed for anxiety. 270 tablet 0   mirtazapine (REMERON) 30 MG tablet Take 1 tablet (30 mg total) by mouth at bedtime. 90 tablet 1   No current facility-administered medications for this visit.     Musculoskeletal: Strength & Muscle Tone:  UTA Gait & Station:  Seated Patient leans: N/A  Psychiatric Specialty Exam: Review of Systems  Psychiatric/Behavioral:  Positive for sleep disturbance. The patient is nervous/anxious.     There were no vitals taken for this visit.There is no height or weight on file to calculate BMI.  General Appearance: Fairly Groomed  Eye Contact:  Fair  Speech:  Clear and Coherent  Volume:  Normal  Mood:  Anxious  Affect:  Congruent  Thought Process:  Goal Directed and Descriptions of Associations: Intact  Orientation:  Full (Time, Place, and Person)  Thought Content: Logical   Suicidal Thoughts:  No  Homicidal Thoughts:  No  Memory:  Immediate;    Fair Recent;   Fair Remote;   Fair  Judgement:  Fair  Insight:  Fair  Psychomotor Activity:  Normal  Concentration:  Concentration: Fair and Attention Span: Fair  Recall:  [employer] of Knowledge: Fair  Language: Fair  Akathisia:  No  Handed:  Right  AIMS (if indicated): not done  Assets:  Communication Skills Desire for Improvement Housing Social Support  ADL's:  Intact  Cognition: WNL  Sleep:   restless   Screenings: AIMS    Flowsheet Row Admission (Discharged) from 03/07/2019 in [facility] INPATIENT BEHAVIORAL MEDICINE  AIMS Total Score 0      AUDIT    Flowsheet Row Admission (Discharged) from 03/07/2019 in [facility] INPATIENT BEHAVIORAL MEDICINE  Alcohol Use Disorder Identification Test Final Score (AUDIT) 0      GAD-7    Flowsheet Row Video Visit from 06/30/2023 in [facility] Office Visit from 02/11/2023 in [facility] Associates Office Visit from 12/30/2022 in [facility] Office Visit from 11/18/2022 in [facility] Associates Office Visit from 11/07/2022 in [facility] at [facility]  Total GAD-7 Score 8 13 14 17 11       PHQ2-9    Flowsheet Row Video Visit from 06/30/2023 in [facility] Video Visit from 03/25/2023 in [facility] Associates Office Visit from 02/11/2023 in [facility] Office Visit from 12/30/2022 in [facility] Office Visit from 11/18/2022 in [facility]  PHQ-2 Total Score 0 0 2 3 3   PHQ-9 Total Score -- -- 13 10 14       Flowsheet Row Video Visit from 09/10/2023 in [facility] Video Visit from 06/30/2023 in [facility] Psychiatric Associates Video  Visit from 03/25/2023 in [facility] Associates  C-SSRS RISK CATEGORY No Risk No Risk Low Risk        Assessment and Plan: Mallory Johnston is a  32 year old African-American female with MDD, GAD, bereavement, currently with job related stressors, anxiety due to the same and sleep problems, will benefit from the following plan.  Plan MDD in remission Mirtazapine 30 mg p.o. nightly Lexapro 10 mg p.o. daily with supper  GAD-unstable Discussed to establish care with a new therapist-provided resources in the community-Ms. Felecia Jan, Ms. Norma Fredrickson Horton. Lexapro 10 mg p.o. daily with supper Hydroxyzine 10 mg p.o. 3 times daily as needed for anxiety Patient could also use hydroxyzine for sleep as needed.  Discussed sleep hygiene techniques.  Will consider adding a sleep medication in the future as needed  Bereavement-stable Continue CBT as needed     Collaboration of Care: Collaboration of Care: Referral or follow-up with counselor/therapist AEB patient encouraged to establish care with therapist.  Patient/Guardian was advised Release of Information must be obtained prior to any record release in order to collaborate their care with an outside provider. Patient/Guardian was advised if they have not already done so to contact the registration department to sign all necessary forms in order for [place] to release information regarding their care.   Consent: Patient/Guardian gives verbal consent for treatment and assignment of benefits for services provided during this visit. Patient/Guardian expressed understanding and agreed to proceed.  Follow-up in clinic in 4 to 8 weeks or sooner if needed.  This note was generated in part or whole with voice recognition software. Voice recognition is usually quite accurate but there are transcription errors that can and very often do occur. I apologize for any typographical errors that were not detected and  corrected.     Jomarie Longs, MD 09/11/2023, 7:58 AM

## 2023-09-24 ENCOUNTER — Ambulatory Visit: Payer: BC Managed Care – PPO | Admitting: Podiatry

## 2023-09-24 DIAGNOSIS — S93402A Sprain of unspecified ligament of left ankle, initial encounter: Secondary | ICD-10-CM | POA: Diagnosis not present

## 2023-09-24 DIAGNOSIS — M7752 Other enthesopathy of left foot: Secondary | ICD-10-CM | POA: Diagnosis not present

## 2023-09-24 NOTE — Progress Notes (Signed)
Subjective:  Patient ID: Mallory Johnston, female    DOB: 07-30-1991,  MRN: [id number]  Chief Complaint  Patient presents with   Foot Pain    Pt stated that she has good days and bad ones     32 y.o. female presents with the above complaint.  Patient presents with continuous left lateral ankle pain.  She states that doing good.  Injection helped she still has residual pain.  She is changing jobs which will also help take the pressure away from the foot.  Denies any other acute complaints.   Review of Systems: Negative except as noted in the HPI. Denies N/V/F/Ch.  Past Medical History:  Diagnosis Date   Anxiety    Depression    H/O bilateral breast reduction surgery    History of left oophorectomy 2009   due to ovarian cyst   Lymphadenopathy 2009   left leg   Lymphedema of left leg    S/P tonsillectomy    Suicidal ideation 03/06/2019   Von Willebrand disease (HCC) 2009    Current Outpatient Medications:    cetirizine (ZYRTEC ALLERGY) 10 MG tablet, Take 1 tablet (10 mg total) by mouth daily., Disp: 90 tablet, Rfl: 1   drospirenone-ethinyl estradiol (YASMIN 28) 3-0.03 MG tablet, Take 1 tablet by mouth daily., Disp: 84 tablet, Rfl: 3   escitalopram (LEXAPRO) 10 MG tablet, Take 1 tablet (10 mg total) by mouth daily with breakfast., Disp: 90 tablet, Rfl: 1   fluticasone (FLONASE) 50 MCG/ACT nasal spray, Place 2 sprays into both nostrils daily., Disp: 16 g, Rfl: 6   hydrOXYzine (ATARAX) 10 MG tablet, Take 1 tablet (10 mg total) by mouth 3 (three) times daily as needed for anxiety., Disp: 270 tablet, Rfl: 0   mirtazapine (REMERON) 30 MG tablet, Take 1 tablet (30 mg total) by mouth at bedtime., Disp: 90 tablet, Rfl: 1  Social History   Tobacco Use  Smoking Status Never  Smokeless Tobacco Never    Allergies  Allergen Reactions   Salmon [Fish Oil]    Amoxicillin Rash   Percocet [Oxycodone-Acetaminophen] Nausea And Vomiting and Rash   Objective:  There were no vitals filed for  this visit. There is no height or weight on file to calculate BMI. Constitutional Well developed. Well nourished.  Vascular Dorsalis pedis pulses palpable bilaterally. Posterior tibial pulses palpable bilaterally. Capillary refill normal to all digits.  No cyanosis or clubbing noted. Pedal hair growth normal.  Neurologic Normal speech. Oriented to person, place, and time. Epicritic sensation to light touch grossly present bilaterally.  Dermatologic Nails well groomed and normal in appearance. No open wounds. No skin lesions.  Orthopedic:  Moderate pain is now localized more to the left lateral ankle mild pain with dorsiflexion plantarflexion resisted.  Moderate pain to the midfoot.  There is still edema present nonpitting to the left lower extremity.  Patient has a semiflexible pes cavus foot structure.  No hammertoe contractures noted.  Tight Achilles noted.   Radiographs: No fracture or malalignment. Mild chronic sclerosis base of fifth proximal phalanx. The joint spaces appear within normal limits. There is no evidence of fracture, dislocation, or joint effusion. There is no evidence of arthropathy or other focal bone abnormality. Soft tissues are unremarkable.  1. Apparent lateral midfoot degenerative changes which could be posttraumatic in etiology. No subluxation at the Lisfranc joint identified. The Lisfranc ligament appears intact. 2. No talar dome osteochondral lesion, tibiotalar or hindfoot arthropathy identified. 3. The ankle tendons and ligaments appear intact. Assessment:  No diagnosis found.   Plan:  Patient was evaluated and treated and all questions answered.  Left lateral ankle sprain/ATFL ligament with underlying capsulitis -Given that she still continues to have pain I believe she will benefit from another steroid injection of decrease acute inflammatory component associate with pain.  She is unable to transition out of the boot into regular shoes.  Patient  agrees plan would like to try another steroid injection -A second steroid injection was performed at right ankle joint using 1% plain Lidocaine and 10 mg of Kenalog. This was well tolerated. -Tri-Lock ankle brace was dispensed for stability.  If there is continuous issue patient will need surgical intervention  Pes cavus -I explained to the patient the etiology of pes cavus foot structure and various treatment options were discussed.  Given that she is having generalized pain to the lower extremity likely due to no support in the shoes I believe she will benefit from orthotics.  -Orthotics were dispensed and are functioning well ligament tear  No follow-ups on file.

## 2023-11-05 ENCOUNTER — Ambulatory Visit: Payer: Self-pay | Admitting: Psychiatry

## 2023-11-20 ENCOUNTER — Ambulatory Visit
Admission: EM | Admit: 2023-11-20 | Discharge: 2023-11-20 | Disposition: A | Discharge status: Home / Self Care | Payer: Self-pay | Attending: Emergency Medicine | Admitting: Emergency Medicine

## 2023-11-20 DIAGNOSIS — R21 Rash and other nonspecific skin eruption: Secondary | ICD-10-CM

## 2023-11-20 MED ORDER — PREDNISONE 10 MG (21) PO TBPK: ORAL_TABLET | Freq: Every day | ORAL | 0 refills | Status: DC

## 2023-11-20 NOTE — ED Triage Notes (Signed)
Pt presents with itchy rash on her hands, abdomin, and legs that started Monday. Pt also has discoloration to her finger nails. Pt is putting otc eczema lotion with no relief of symptoms.

## 2023-11-20 NOTE — ED Provider Notes (Signed)
Mallory Johnston    CSN: [id number] Arrival date & time: 11/20/23  1319      History   Chief Complaint Chief Complaint  Patient presents with   Rash    HPI Mallory Johnston is a 32 y.o. female.   Patient presents for evaluation of pruritic rash affecting the bilateral hands, the abdomen, the bilateral legs and the right arm present for 7 days.  Initially noticed rash to the fingers, believed to be related to gel polish which she has been doing at home, has used in the past without complication.  Did remove polish and saw no improvement in symptoms, some discoloration to some of the nailbeds.  Has been scratching to the point of bleeding.  Has attempted use of Eucerin eczema cream which has not resolved symptoms.  Denies presence of drainage or fever.  Has not occurred in the past.  Denies changes in diet, recent travel or medication changes.  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 History of tonsillectomy.  Past Medical History:  Diagnosis Date   Anxiety    Depression    H/O bilateral breast reduction surgery    History of left oophorectomy 2009   due to ovarian cyst   Lymphadenopathy 2009   left leg   Lymphedema of left leg    S/P tonsillectomy    Suicidal ideation 03/06/2019   Von Willebrand disease (HCC) 2009    Patient Active Problem List   Diagnosis Date Noted   Hx of von Willebrand's disease 12/08/2022   MDD (major depressive disorder), recurrent episode, moderate (HCC) 11/18/2022   Bereavement 11/18/2022   Anxiety disorder 11/18/2022   Heavy menstrual period 10/14/2022   Migraine without aura and without status migrainosus, not intractable 01/11/2021   Swelling of limb 11/29/2019   Lymphedema 11/29/2019   Noncompliance 03/06/2019   Class 1 obesity due to excess calories without serious comorbidity with body mass index (BMI) of 30.0 to 30.9 in adult 02/11/2017   History of cervical dysplasia 02/11/2017   Liver lesion, right lobe  10/17/2016   PCOS (polycystic ovarian syndrome) 09/20/2015    Past Surgical History:  Procedure Laterality Date   BREAST REDUCTION SURGERY  2004   LOWER EXTREMITY VENOGRAPHY Left 09/24/2020   Procedure: LOWER EXTREMITY VENOGRAPHY;  Surgeon: Annice Needy, MD;  Location: [facility] INVASIVE CV LAB;  Service: Cardiovascular;  Laterality: Left;   OVARIAN CYST REMOVAL     REDUCTION MAMMAPLASTY     TONSILLECTOMY      OB History     Gravida  2   Para      Term      Preterm      AB  2   Living         SAB  2   IAB      Ectopic      Multiple      Live Births               Home Medications    Prior to Admission medications   Medication Sig Start Date End Date Taking? Authorizing Provider  cetirizine (ZYRTEC ALLERGY) 10 MG tablet Take 1 tablet (10 mg total) by mouth daily. 11/02/22  Yes Hawks, Christy A, FNP  drospirenone-ethinyl estradiol (YASMIN 28) 3-0.03 MG tablet Take 1 tablet by mouth daily. 03/25/23  Yes Hildred Laser, MD  escitalopram (LEXAPRO) 10 MG tablet Take 1 tablet (10 mg total) by mouth daily with breakfast. 09/10/23  Yes Jomarie Longs, MD  hydrOXYzine (ATARAX) 10 MG tablet Take 1 tablet (10 mg total) by mouth 3 (three) times daily as needed for anxiety. 09/10/23  Yes Jomarie Longs, MD  mirtazapine (REMERON) 30 MG tablet Take 1 tablet (30 mg total) by mouth at bedtime. 09/10/23  Yes Eappen, Levin Bacon, MD  predniSONE (STERAPRED UNI-PAK 21 TAB) 10 MG (21) TBPK tablet Take by mouth daily. Take 6 tabs by mouth daily  for 2 days, then 5 tabs for 2 days, then 4 tabs for 2 days, then 3 tabs for 2 days, 2 tabs for 2 days, then 1 tab by mouth daily for 2 days 11/20/23  Yes Ceri Mayer R, NP  fluticasone (FLONASE) 50 MCG/ACT nasal spray Place 2 sprays into both nostrils daily. 11/02/22   Junie Spencer, FNP    Family History Family History  Problem Relation Age of Onset   Depression Mother    Diabetes Mother    Hypertension Father    Breast cancer Neg Hx     Ovarian cancer Neg Hx    Colon cancer Neg Hx     Social History Social History   Tobacco Use   Smoking status: Never   Smokeless tobacco: Never  Vaping Use   Vaping status: Never Used  Substance Use Topics   Alcohol use: Not Currently    Comment: rare   Drug use: No     Allergies   Salmon [fish oil], Amoxicillin, and Percocet [oxycodone-acetaminophen]   Review of Systems Review of Systems  Skin:  Positive for rash.     Physical Exam Triage Vital Signs ED Triage Vitals [11/20/23 1340]  Encounter Vitals Group     BP 118/84     Systolic BP Percentile      Diastolic BP Percentile      Pulse Rate 96     Resp 18     Temp 99.1 F (37.3 C)     Temp Source Oral     SpO2 96 %     Weight      Height      Head Circumference      Peak Flow      Pain Score 0     Pain Loc      Pain Education      Exclude from Growth Chart    No data found.  Updated Vital Signs BP 118/84 (BP Location: Left Arm)   Pulse 96   Temp 99.1 F (37.3 C) (Oral)   Resp 18   LMP 08/18/2023 (Approximate)   SpO2 96%   Visual Acuity Right Eye Distance:   Left Eye Distance:   Bilateral Distance:    Right Eye Near:   Left Eye Near:    Bilateral Near:     Physical Exam Constitutional:      Appearance: Normal appearance.  Eyes:     Extraocular Movements: Extraocular movements intact.  Pulmonary:     Effort: Pulmonary effort is normal.  Skin:    Comments: Flesh tone papular rash generalized to the body  Neurological:     Mental Status: She is alert and oriented to person, place, and time. Mental status is at baseline.      UC Treatments / Results  Labs (all labs ordered are listed, but only abnormal results are displayed) Labs Reviewed - No data to display  EKG   Radiology No results found.  Procedures Procedures (including critical care time)  Medications Ordered in UC Medications - No data to display  Initial Impression / Assessment  and Plan / UC Course  I have  reviewed the triage vital signs and the nursing notes.  Pertinent labs & imaging results that were available during my care of the patient were reviewed by me and considered in my medical decision making (see chart for details).  Rash  Unknown etiology, affecting all areas of the body, no signs of infection, discussed this with patient, most consistent with a contact dermatitis, declined IM steroid injection, prescribed oral prednisone advised use of oral or topical antihistamines and calamine lotion for management of pruritus and advised against long exposure to heat however patient endorses that this did provide her comfort while in the shower therefore she may continue to do so as needed, may follow-up with urgent care if symptoms persist worsen or recur Final Clinical Impressions(s) / UC Diagnoses   Final diagnoses:  Rash     Discharge Instructions      Today you are being treated for rash, on exam appears as if a reaction to something your skin has come in contact with at this time there are no signs of infection  take prednisone every morning with food as directed, to help reduce the inflammatory process that occurs with this rash which will help minimize your itching as well as begin to clear  You may continue use of topical calamine or Benadryl cream to help manage itching, you may also continue oral Benadryl   if heat provides comfort to your skin and is not worsening your symptoms you may continue use as needed  You may follow-up with his urgent care as needed if symptoms persist or worsen    ED Prescriptions     Medication Sig Dispense Auth. Provider   predniSONE (STERAPRED UNI-PAK 21 TAB) 10 MG (21) TBPK tablet Take by mouth daily. Take 6 tabs by mouth daily  for 2 days, then 5 tabs for 2 days, then 4 tabs for 2 days, then 3 tabs for 2 days, 2 tabs for 2 days, then 1 tab by mouth daily for 2 days 42 tablet Peggy Monk, Elita Boone, NP      PDMP not reviewed this encounter.    Valinda Hoar, NP 11/20/23 1425

## 2023-11-20 NOTE — Discharge Instructions (Signed)
Today you are being treated for rash, on exam appears as if a reaction to something your skin has come in contact with at this time there are no signs of infection  take prednisone every morning with food as directed, to help reduce the inflammatory process that occurs with this rash which will help minimize your itching as well as begin to clear  You may continue use of topical calamine or Benadryl cream to help manage itching, you may also continue oral Benadryl   if heat provides comfort to your skin and is not worsening your symptoms you may continue use as needed  You may follow-up with his urgent care as needed if symptoms persist or worsen

## 2023-12-24 ENCOUNTER — Encounter: Payer: Self-pay | Admitting: Internal Medicine

## 2023-12-25 ENCOUNTER — Ambulatory Visit: Payer: BC Managed Care – PPO | Admitting: Internal Medicine

## 2023-12-25 ENCOUNTER — Encounter: Payer: Self-pay | Admitting: Internal Medicine

## 2023-12-25 VITALS — BP 119/68 | HR 101 | Temp 98.1°F | Ht 61.0 in | Wt 230.0 lb

## 2023-12-25 DIAGNOSIS — R059 Cough, unspecified: Secondary | ICD-10-CM

## 2023-12-25 DIAGNOSIS — H6693 Otitis media, unspecified, bilateral: Secondary | ICD-10-CM

## 2023-12-25 DIAGNOSIS — J0100 Acute maxillary sinusitis, unspecified: Secondary | ICD-10-CM | POA: Diagnosis not present

## 2023-12-25 MED ORDER — AZITHROMYCIN 250 MG PO TABS: ORAL_TABLET | ORAL | 0 refills | Status: AC

## 2023-12-25 MED ORDER — PROMETHAZINE-DM 6.25-15 MG/5ML PO SYRP: 5.0000 mL | ORAL_SOLUTION | Freq: Four times a day (QID) | ORAL | 0 refills | Status: AC | PRN

## 2023-12-25 MED ORDER — ALBUTEROL SULFATE HFA 108 (90 BASE) MCG/ACT IN AERS: 2.0000 | INHALATION_SPRAY | Freq: Four times a day (QID) | RESPIRATORY_TRACT | 0 refills | Status: DC | PRN

## 2023-12-25 NOTE — Progress Notes (Signed)
Date:  12/25/2023   Name:  Mallory Johnston   DOB:  Mar 04, 1991   MRN:  [id number]   Chief Complaint: Cold (Pt is here due to having cold symptoms for over a week. Pt states at night she coughs a lot and is not getting any sleep. She says it has gotten worst. Pt tested herself for flu and covid and both came back negative. No fever and no sore throat)  HPI  Review of Systems  Constitutional:  Positive for diaphoresis, fatigue and fever. Negative for chills.  HENT:  Positive for congestion, sinus pressure and sinus pain.   Respiratory:  Positive for cough. Negative for chest tightness, shortness of breath and wheezing.   Gastrointestinal:  Negative for abdominal pain and nausea.  Psychiatric/Behavioral:  Positive for sleep disturbance. Negative for dysphoric mood. The patient is not nervous/anxious.      Lab Results  Component Value Date   NA 139 03/05/2019   K 3.4 (L) 03/05/2019   CO2 25 03/05/2019   GLUCOSE 96 03/05/2019   BUN 9 09/24/2020   CREATININE 0.74 09/24/2020   CALCIUM 9.2 03/05/2019   GFRNONAA >60 09/24/2020   Lab Results  Component Value Date   CHOL 148 03/08/2019   HDL 52 03/08/2019   LDLCALC 73 03/08/2019   TRIG 115 03/08/2019   CHOLHDL 2.8 03/08/2019   Lab Results  Component Value Date   TSH 0.989 11/18/2022   Lab Results  Component Value Date   HGBA1C 5.3 03/08/2019   Lab Results  Component Value Date   WBC 11.0 (H) 10/14/2022   HGB 12.2 10/14/2022   HCT 36.6 10/14/2022   MCV 85.5 10/14/2022   PLT 306 10/14/2022   Lab Results  Component Value Date   ALT 15 03/05/2019   AST 21 03/05/2019   ALKPHOS 65 03/05/2019   BILITOT 0.5 03/05/2019   No results found for: "25OHVITD2", "25OHVITD3", "VD25OH"   Patient Active Problem List   Diagnosis Date Noted   Hx of von Willebrand's disease 12/08/2022   MDD (major depressive disorder), recurrent episode, moderate (HCC) 11/18/2022   Bereavement 11/18/2022   Anxiety disorder 11/18/2022   Heavy  menstrual period 10/14/2022   Migraine without aura and without status migrainosus, not intractable 01/11/2021   Swelling of limb 11/29/2019   Lymphedema 11/29/2019   Class 1 obesity due to excess calories without serious comorbidity with body mass index (BMI) of 30.0 to 30.9 in adult 02/11/2017   History of cervical dysplasia 02/11/2017   Liver lesion, right lobe 10/17/2016   PCOS (polycystic ovarian syndrome) 09/20/2015    Allergies  Allergen Reactions   Salmon [Fish Oil]    Amoxicillin Rash   Percocet [Oxycodone-Acetaminophen] Nausea And Vomiting and Rash    Past Surgical History:  Procedure Laterality Date   BREAST REDUCTION SURGERY  2004   LOWER EXTREMITY VENOGRAPHY Left 09/24/2020   Procedure: LOWER EXTREMITY VENOGRAPHY;  Surgeon: Annice Needy, MD;  Location: [facility] INVASIVE CV LAB;  Service: Cardiovascular;  Laterality: Left;   OVARIAN CYST REMOVAL     REDUCTION MAMMAPLASTY     TONSILLECTOMY      Social History   Tobacco Use   Smoking status: Never   Smokeless tobacco: Never  Vaping Use   Vaping status: Never Used  Substance Use Topics   Alcohol use: Not Currently    Comment: rare   Drug use: No     Medication list has been reviewed and updated.  Current Meds  Medication Sig  albuterol (VENTOLIN HFA) 108 (90 Base) MCG/ACT inhaler Inhale 2 puffs into the lungs every 6 (six) hours as needed for wheezing or shortness of breath.   azithromycin (ZITHROMAX Z-PAK) 250 MG tablet UAD   cetirizine (ZYRTEC ALLERGY) 10 MG tablet Take 1 tablet (10 mg total) by mouth daily.   drospirenone-ethinyl estradiol (YASMIN 28) 3-0.03 MG tablet Take 1 tablet by mouth daily.   escitalopram (LEXAPRO) 10 MG tablet Take 1 tablet (10 mg total) by mouth daily with breakfast.   fluticasone (FLONASE) 50 MCG/ACT nasal spray Place 2 sprays into both nostrils daily.   hydrOXYzine (ATARAX) 10 MG tablet Take 1 tablet (10 mg total) by mouth 3 (three) times daily as needed for anxiety.    mirtazapine (REMERON) 30 MG tablet Take 1 tablet (30 mg total) by mouth at bedtime.   promethazine-dextromethorphan (PROMETHAZINE-DM) 6.25-15 MG/5ML syrup Take 5 mLs by mouth 4 (four) times daily as needed for up to 9 days for cough.   [DISCONTINUED] predniSONE (STERAPRED UNI-PAK 21 TAB) 10 MG (21) TBPK tablet Take by mouth daily. Take 6 tabs by mouth daily  for 2 days, then 5 tabs for 2 days, then 4 tabs for 2 days, then 3 tabs for 2 days, 2 tabs for 2 days, then 1 tab by mouth daily for 2 days       12/25/2023    4:05 PM 06/30/2023   10:07 AM 02/11/2023    8:43 AM 12/30/2022    3:28 PM  GAD 7 : Generalized Anxiety Score  Nervous, Anxious, on Edge 0     Control/stop worrying 0     Worry too much - different things 0     Trouble relaxing 0     Restless 0     Easily annoyed or irritable 0     Afraid - awful might happen 0     Total GAD 7 Score 0     Anxiety Difficulty Not difficult at all        Information is confidential and restricted. Go to Review Flowsheets to unlock data.       12/25/2023    4:05 PM 06/30/2023   10:07 AM 03/25/2023    9:38 AM  Depression screen PHQ 2/9  Decreased Interest 0    Down, Depressed, Hopeless 0    PHQ - 2 Score 0    Altered sleeping 0    Tired, decreased energy 1    Change in appetite 0    Feeling bad or failure about yourself  0    Trouble concentrating 0    Moving slowly or fidgety/restless 0    Suicidal thoughts 0    PHQ-9 Score 1    Difficult doing work/chores Not difficult at all       Information is confidential and restricted. Go to Review Flowsheets to unlock data.    BP Readings from Last 3 Encounters:  12/25/23 119/68  11/20/23 118/84  05/07/23 120/79    Physical Exam Constitutional:      Appearance: Normal appearance. She is well-developed.  HENT:     Right Ear: Ear canal and external ear normal. Tympanic membrane is erythematous and retracted.     Left Ear: Ear canal and external ear normal. Tympanic membrane is retracted.  Tympanic membrane is not erythematous.     Nose:     Right Sinus: Maxillary sinus tenderness and frontal sinus tenderness present.     Left Sinus: Maxillary sinus tenderness and frontal sinus tenderness present.  Mouth/Throat:     Mouth: No oral lesions.     Pharynx: Uvula midline. Posterior oropharyngeal erythema present. No oropharyngeal exudate.  Cardiovascular:     Rate and Rhythm: Normal rate and regular rhythm.     Heart sounds: Normal heart sounds.  Pulmonary:     Breath sounds: Normal breath sounds. Transmitted upper airway sounds present. No wheezing, rhonchi or rales.  Musculoskeletal:     Cervical back: Normal range of motion.  Lymphadenopathy:     Cervical: No cervical adenopathy.  Neurological:     Mental Status: She is alert and oriented to person, place, and time.     Wt Readings from Last 3 Encounters:  12/25/23 230 lb (104.3 kg)  03/25/23 221 lb (100.2 kg)  02/17/23 222 lb (100.7 kg)    BP 119/68   Pulse (!) 101 Comment: goes up eveytime pt coughs  Temp 98.1 F (36.7 C)   Ht 5\' 1"  (1.549 m)   Wt 230 lb (104.3 kg)   SpO2 99%   BMI 43.46 kg/m   Assessment and Plan:  Problem List Items Addressed This Visit   None Visit Diagnoses       Acute non-recurrent maxillary sinusitis    -  Primary   Mucinex DM take Zpak - follow up if needed   Relevant Medications   promethazine-dextromethorphan (PROMETHAZINE-DM) 6.25-15 MG/5ML syrup   azithromycin (ZITHROMAX Z-PAK) 250 MG tablet     Cough in adult       continue fluids, Mucinex DM use Albuterol MDI PRN   Relevant Medications   promethazine-dextromethorphan (PROMETHAZINE-DM) 6.25-15 MG/5ML syrup   albuterol (VENTOLIN HFA) 108 (90 Base) MCG/ACT inhaler     Otitis of both ears       Relevant Medications   azithromycin (ZITHROMAX Z-PAK) 250 MG tablet       No follow-ups on file.    Reubin Milan, MD [facility] [place]

## 2024-01-01 ENCOUNTER — Ambulatory Visit: Payer: Self-pay | Admitting: Allergy

## 2024-01-21 ENCOUNTER — Other Ambulatory Visit: Payer: Self-pay | Admitting: Internal Medicine

## 2024-01-21 DIAGNOSIS — R059 Cough, unspecified: Secondary | ICD-10-CM

## 2024-01-21 NOTE — Telephone Encounter (Signed)
Requested Prescriptions  Pending Prescriptions Disp Refills   albuterol (VENTOLIN HFA) 108 (90 Base) MCG/ACT inhaler [Pharmacy Med Name: ALBUTEROL HFA (VENTOLIN) INH] 18 each 0    Sig: TAKE 2 PUFFS BY MOUTH EVERY 6 HOURS AS NEEDED FOR WHEEZE OR SHORTNESS OF BREATH     Pulmonology:  [facility] 2 Passed - 01/21/2024  9:24 AM      Passed - Last BP in normal range    BP Readings from Last 1 Encounters:  12/25/23 119/68         Passed - Last Heart Rate in normal range    Pulse Readings from Last 1 Encounters:  12/25/23 (!) 101         Passed - Valid encounter within last 12 months    Recent Outpatient Visits           3 weeks ago Acute non-recurrent maxillary sinusitis   [facility] [facility] at [facility], Nyoka Cowden, MD   1 year ago Disorder of right eustachian tube   [facility] at [facility], Nyoka Cowden, MD   1 year ago Severe recurrent major depression without psychotic features [facility])   [place] [facility] at [facility], Nyoka Cowden, MD   1 year ago Gastroenteritis   [facility] at [facility], Nyoka Cowden, MD   2 years ago COVID-19 virus infection   [facility] at [facility], Nyoka Cowden, MD

## 2024-01-29 ENCOUNTER — Ambulatory Visit: Payer: Self-pay | Admitting: Allergy

## 2024-01-31 ENCOUNTER — Encounter: Payer: Self-pay | Admitting: Emergency Medicine

## 2024-01-31 ENCOUNTER — Ambulatory Visit
Admission: EM | Admit: 2024-01-31 | Discharge: 2024-01-31 | Disposition: A | Discharge status: Home / Self Care | Payer: BC Managed Care – PPO | Attending: Emergency Medicine | Admitting: Emergency Medicine

## 2024-01-31 DIAGNOSIS — J101 Influenza due to other identified influenza virus with other respiratory manifestations: Secondary | ICD-10-CM

## 2024-01-31 LAB — POCT INFLUENZA A/B
Influenza A, POC: POSITIVE — AB
Influenza B, POC: NEGATIVE

## 2024-01-31 MED ORDER — OSELTAMIVIR PHOSPHATE 75 MG PO CAPS: 75.0000 mg | ORAL_CAPSULE | Freq: Two times a day (BID) | ORAL | 0 refills | Status: DC

## 2024-01-31 MED ORDER — PROMETHAZINE-DM 6.25-15 MG/5ML PO SYRP: 5.0000 mL | ORAL_SOLUTION | Freq: Four times a day (QID) | ORAL | 0 refills | Status: DC | PRN

## 2024-01-31 NOTE — Discharge Instructions (Addendum)
Influenza A is a virus and should steadily improve in time it can take up to 7 to 10 days before you truly start to see a turnaround however things will get better  Begin Tamiflu every morning and every evening for 5 days to reduce the amount of virus in the body which helps to minimize symptoms    You can take Tylenol and/or Ibuprofen as needed for fever reduction and pain relief.   For cough: honey 1/2 to 1 teaspoon (you can dilute the honey in water or another fluid).  You can also use guaifenesin and dextromethorphan for cough. You can use a humidifier for chest congestion and cough.  If you don't have a humidifier, you can sit in the bathroom with the hot shower running.      For sore throat: try warm salt water gargles, cepacol lozenges, throat spray, warm tea or water with lemon/honey, popsicles or ice, or OTC cold relief medicine for throat discomfort.   For congestion: take a daily anti-histamine like Zyrtec, Claritin, and a oral decongestant, such as pseudoephedrine.  You can also use Flonase 1-2 sprays in each nostril daily.   It is important to stay hydrated: drink plenty of fluids (water, gatorade/powerade/pedialyte, juices, or teas) to keep your throat moisturized and help further relieve irritation/discomfort.

## 2024-01-31 NOTE — ED Triage Notes (Signed)
Pt presents with a fever, cough and congestion since yesterday.

## 2024-01-31 NOTE — ED Provider Notes (Signed)
Renaldo Fiddler    CSN: [id number] Arrival date & time: 01/31/24  1119      History   Chief Complaint Chief Complaint  Patient presents with   Fever   Cough   Nasal Congestion    HPI Mallory Johnston is a 33 y.o. female.   Patient presents for evaluation of fever peaking at 101, nasal congestion, rhinorrhea, nonproductive cough and diarrhea beginning 1 day ago.  Known sick contact at work who had sinus infection.  Poor appetite.  Has attempted use of Tylenol, nasal spray, inhaler, Mucinex and Promethazine DM.  Denies shortness of breath and wheezing.  Past Medical History:  Diagnosis Date   Anxiety    Depression    H/O bilateral breast reduction surgery    History of left oophorectomy 2009   due to ovarian cyst   Lymphadenopathy 2009   left leg   Lymphedema of left leg    S/P tonsillectomy    Suicidal ideation 03/06/2019   Von Willebrand disease (HCC) 2009    Patient Active Problem List   Diagnosis Date Noted   Hx of von Willebrand's disease 12/08/2022   MDD (major depressive disorder), recurrent episode, moderate (HCC) 11/18/2022   Bereavement 11/18/2022   Anxiety disorder 11/18/2022   Heavy menstrual period 10/14/2022   Migraine without aura and without status migrainosus, not intractable 01/11/2021   Swelling of limb 11/29/2019   Lymphedema 11/29/2019   Class 1 obesity due to excess calories without serious comorbidity with body mass index (BMI) of 30.0 to 30.9 in adult 02/11/2017   History of cervical dysplasia 02/11/2017   Liver lesion, right lobe 10/17/2016   PCOS (polycystic ovarian syndrome) 09/20/2015    Past Surgical History:  Procedure Laterality Date   BREAST REDUCTION SURGERY  2004   LOWER EXTREMITY VENOGRAPHY Left 09/24/2020   Procedure: LOWER EXTREMITY VENOGRAPHY;  Surgeon: Annice Needy, MD;  Location: [facility] INVASIVE CV LAB;  Service: Cardiovascular;  Laterality: Left;   OVARIAN CYST REMOVAL     REDUCTION MAMMAPLASTY     TONSILLECTOMY       OB History     Gravida  2   Para      Term      Preterm      AB  2   Living         SAB  2   IAB      Ectopic      Multiple      Live Births               Home Medications    Prior to Admission medications   Medication Sig Start Date End Date Taking? Authorizing Provider  albuterol (VENTOLIN HFA) 108 (90 Base) MCG/ACT inhaler TAKE 2 PUFFS BY MOUTH EVERY 6 HOURS AS NEEDED FOR WHEEZE OR SHORTNESS OF BREATH 01/21/24  Yes Reubin Milan, MD  cetirizine (ZYRTEC ALLERGY) 10 MG tablet Take 1 tablet (10 mg total) by mouth daily. 11/02/22  Yes Hawks, Christy A, FNP  drospirenone-ethinyl estradiol (YASMIN 28) 3-0.03 MG tablet Take 1 tablet by mouth daily. 03/25/23  Yes Hildred Laser, MD  mirtazapine (REMERON) 30 MG tablet Take 1 tablet (30 mg total) by mouth at bedtime. 09/10/23  Yes Jomarie Longs, MD  oseltamivir (TAMIFLU) 75 MG capsule Take 1 capsule (75 mg total) by mouth every 12 (twelve) hours. 01/31/24  Yes Ellianna Ruest, Elita Boone, NP  promethazine-dextromethorphan (PROMETHAZINE-DM) 6.25-15 MG/5ML syrup Take 5 mLs by mouth 4 (four) times daily as needed  for cough. 01/31/24  Yes Granite Godman R, NP  escitalopram (LEXAPRO) 10 MG tablet Take 1 tablet (10 mg total) by mouth daily with breakfast. 09/10/23   Jomarie Longs, MD  fluticasone (FLONASE) 50 MCG/ACT nasal spray Place 2 sprays into both nostrils daily. 11/02/22   Junie Spencer, FNP  hydrOXYzine (ATARAX) 10 MG tablet Take 1 tablet (10 mg total) by mouth 3 (three) times daily as needed for anxiety. 09/10/23   Jomarie Longs, MD    Family History Family History  Problem Relation Age of Onset   Depression Mother    Diabetes Mother    Hypertension Father    Breast cancer Neg Hx    Ovarian cancer Neg Hx    Colon cancer Neg Hx     Social History Social History   Tobacco Use   Smoking status: Never   Smokeless tobacco: Never  Vaping Use   Vaping status: Never Used  Substance Use Topics   Alcohol use: Not  Currently    Comment: rare   Drug use: No     Allergies   Salmon [fish oil], Amoxicillin, and Percocet [oxycodone-acetaminophen]   Review of Systems Review of Systems   Physical Exam Triage Vital Signs ED Triage Vitals  Encounter Vitals Group     BP 01/31/24 1247 128/88     Systolic BP Percentile --      Diastolic BP Percentile --      Pulse Rate 01/31/24 1247 (!) 110     Resp 01/31/24 1247 18     Temp 01/31/24 1247 (!) 100.5 F (38.1 C)     Temp Source 01/31/24 1247 Oral     SpO2 01/31/24 1247 97 %     Weight --      Height --      Head Circumference --      Peak Flow --      Pain Score 01/31/24 1245 0     Pain Loc --      Pain Education --      Exclude from Growth Chart --    No data found.  Updated Vital Signs BP 128/88 (BP Location: Right Arm)   Pulse (!) 110   Temp (!) 100.5 F (38.1 C) (Oral)   Resp 18   LMP 01/30/2024   SpO2 97%   Visual Acuity Right Eye Distance:   Left Eye Distance:   Bilateral Distance:    Right Eye Near:   Left Eye Near:    Bilateral Near:     Physical Exam Constitutional:      Appearance: She is ill-appearing.  HENT:     Head: Normocephalic.     Right Ear: Tympanic membrane, ear canal and external ear normal.     Left Ear: Tympanic membrane, ear canal and external ear normal.     Nose: Congestion present. No rhinorrhea.     Mouth/Throat:     Pharynx: Posterior oropharyngeal erythema present. No oropharyngeal exudate.  Eyes:     Extraocular Movements: Extraocular movements intact.  Cardiovascular:     Rate and Rhythm: Normal rate and regular rhythm.     Pulses: Normal pulses.     Heart sounds: Normal heart sounds.  Pulmonary:     Effort: Pulmonary effort is normal.     Breath sounds: Normal breath sounds.  Musculoskeletal:     Cervical back: Normal range of motion and neck supple.  Neurological:     Mental Status: She is alert and oriented to person, place, and  time. Mental status is at baseline.      UC  Treatments / Results  Labs (all labs ordered are listed, but only abnormal results are displayed) Labs Reviewed  POCT INFLUENZA A/B - Abnormal; Notable for the following components:      Result Value   Influenza A, POC Positive (*)    All other components within normal limits    EKG   Radiology No results found.  Procedures Procedures (including critical care time)  Medications Ordered in UC Medications - No data to display  Initial Impression / Assessment and Plan / UC Course  I have reviewed the triage vital signs and the nursing notes.  Pertinent labs & imaging results that were available during my care of the patient were reviewed by me and considered in my medical decision making (see chart for details).  Influenza A  Patient is in no signs of distress nor toxic appearing.  Vital signs are stable.  Low suspicion for pneumonia, pneumothorax or bronchitis and therefore will defer imaging.  Prescribed Tamiflu and Promethazine DM refill.May use additional over-the-counter medications as needed for supportive care.  May follow-up with urgent care as needed if symptoms persist or worsen.  Note given.   Final Clinical Impressions(s) / UC Diagnoses   Final diagnoses:  Influenza A     Discharge Instructions      Influenza A is a virus and should steadily improve in time it can take up to 7 to 10 days before you truly start to see a turnaround however things will get better  Begin Tamiflu every morning and every evening for 5 days to reduce the amount of virus in the body which helps to minimize symptoms    You can take Tylenol and/or Ibuprofen as needed for fever reduction and pain relief.   For cough: honey 1/2 to 1 teaspoon (you can dilute the honey in water or another fluid).  You can also use guaifenesin and dextromethorphan for cough. You can use a humidifier for chest congestion and cough.  If you don't have a humidifier, you can sit in the bathroom with the hot  shower running.      For sore throat: try warm salt water gargles, cepacol lozenges, throat spray, warm tea or water with lemon/honey, popsicles or ice, or OTC cold relief medicine for throat discomfort.   For congestion: take a daily anti-histamine like Zyrtec, Claritin, and a oral decongestant, such as pseudoephedrine.  You can also use Flonase 1-2 sprays in each nostril daily.   It is important to stay hydrated: drink plenty of fluids (water, gatorade/powerade/pedialyte, juices, or teas) to keep your throat moisturized and help further relieve irritation/discomfort.    ED Prescriptions     Medication Sig Dispense Auth. Provider   promethazine-dextromethorphan (PROMETHAZINE-DM) 6.25-15 MG/5ML syrup Take 5 mLs by mouth 4 (four) times daily as needed for cough. 118 mL Jsean Taussig, Hansel Starling R, NP   oseltamivir (TAMIFLU) 75 MG capsule Take 1 capsule (75 mg total) by mouth every 12 (twelve) hours. 10 capsule Valinda Hoar, NP      PDMP not reviewed this encounter.   Valinda Hoar, NP 01/31/24 1320

## 2024-02-28 ENCOUNTER — Other Ambulatory Visit: Payer: Self-pay | Admitting: Internal Medicine

## 2024-02-28 DIAGNOSIS — R059 Cough, unspecified: Secondary | ICD-10-CM

## 2024-02-29 NOTE — Telephone Encounter (Signed)
 Requested Prescriptions  Pending Prescriptions Disp Refills   albuterol (VENTOLIN HFA) 108 (90 Base) MCG/ACT inhaler [Pharmacy Med Name: ALBUTEROL HFA (VENTOLIN) INH] 3 each 1    Sig: TAKE 2 PUFFS BY MOUTH EVERY 6 HOURS AS NEEDED FOR WHEEZE OR SHORTNESS OF BREATH     Pulmonology:  Beta Agonists 2 Passed - 02/29/2024  4:50 PM      Passed - Last BP in normal range    BP Readings from Last 1 Encounters:  01/31/24 128/88         Passed - Last Heart Rate in normal range    Pulse Readings from Last 1 Encounters:  01/31/24 (!) 110         Passed - Valid encounter within last 12 months    Recent Outpatient Visits           2 months ago Acute non-recurrent maxillary sinusitis   [place] [facility] at [facility], Nyoka Cowden, MD   1 year ago Disorder of right eustachian tube   [facility] at [facility], Nyoka Cowden, MD   1 year ago Severe recurrent major depression without psychotic features [facility])   [place] [facility] at [facility], Nyoka Cowden, MD   1 year ago Gastroenteritis   [facility] at [facility], Nyoka Cowden, MD   2 years ago COVID-19 virus infection   [facility] Medicine at [facility], Nyoka Cowden, MD

## 2024-03-20 ENCOUNTER — Other Ambulatory Visit: Payer: Self-pay | Admitting: Psychiatry

## 2024-03-20 DIAGNOSIS — F411 Generalized anxiety disorder: Secondary | ICD-10-CM

## 2024-03-21 ENCOUNTER — Telehealth: Payer: Self-pay | Admitting: Psychiatry

## 2024-03-21 NOTE — Telephone Encounter (Signed)
 Refill already sent out for 30 days , pls request when due for refill and could send again to last until appointment.

## 2024-03-21 NOTE — Telephone Encounter (Signed)
 I do not see an appointment scheduled for this patient.  Patient needs to be seen in person for the next appointment.  Will have staff contact.

## 2024-03-21 NOTE — Telephone Encounter (Signed)
 Patient called to schedule an in person appointment for 06-01-24 and put on a wait list. She states she will be running out of medication. Please verify and advise patient if can be refilled

## 2024-04-16 ENCOUNTER — Other Ambulatory Visit: Payer: Self-pay | Admitting: Psychiatry

## 2024-04-16 DIAGNOSIS — F411 Generalized anxiety disorder: Secondary | ICD-10-CM

## 2024-05-13 DIAGNOSIS — N939 Abnormal uterine and vaginal bleeding, unspecified: Secondary | ICD-10-CM | POA: Diagnosis not present

## 2024-05-16 DIAGNOSIS — N939 Abnormal uterine and vaginal bleeding, unspecified: Secondary | ICD-10-CM | POA: Diagnosis not present

## 2024-05-19 ENCOUNTER — Ambulatory Visit: Payer: Self-pay | Admitting: Psychiatry

## 2024-05-19 ENCOUNTER — Encounter: Payer: Self-pay | Admitting: Psychiatry

## 2024-05-19 VITALS — BP 118/84 | HR 94 | Temp 98.4°F | Ht 61.0 in | Wt 216.6 lb

## 2024-05-19 DIAGNOSIS — Z634 Disappearance and death of family member: Secondary | ICD-10-CM | POA: Diagnosis not present

## 2024-05-19 DIAGNOSIS — F331 Major depressive disorder, recurrent, moderate: Secondary | ICD-10-CM | POA: Diagnosis not present

## 2024-05-19 DIAGNOSIS — R5383 Other fatigue: Secondary | ICD-10-CM | POA: Insufficient documentation

## 2024-05-19 DIAGNOSIS — F411 Generalized anxiety disorder: Secondary | ICD-10-CM | POA: Diagnosis not present

## 2024-05-19 MED ORDER — ESCITALOPRAM OXALATE 10 MG PO TABS: 10.0000 mg | ORAL_TABLET | Freq: Every day | ORAL | 0 refills | Status: DC

## 2024-05-19 NOTE — Progress Notes (Signed)
 [facility] MD OP Progress Note  05/19/2024 2:48 PM Mallory Johnston  MRN:  [id number]  Chief Complaint:  Chief Complaint  Patient presents with   Follow-up   Depression   Anxiety   Medication Refill   Discussed the use of AI scribe software for clinical note transcription with the patient, who gave verbal consent to proceed.  History of Present Illness Mallory Johnston is a 33 year old African-American female, employed, lives in [place], has a history of MDD, bereavement, GAD, von Willebrand disease, PCOS was evaluated in office today.  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 Emotional stress has increased, particularly since last month, due to her mother's frequent trips to [place]  and absence during Mother's Day, intensifying feelings of loneliness and grief over her grandmother's absence.  She has a history of major depression, generalized anxiety, and grief reaction. Previously on Lexapro , she stopped due to poor eating habits and inconsistent use. She continues to take mirtazapine  30 mg at bedtime. Her mother has noticed mood changes, and she acknowledges increased emotional stress.  She reports poor sleep quality, with inconsistent sleep patterns, waking up multiple times during the night, and averaging about six hours of sleep, leaving her feeling unrested. She snores and experiences daytime fatigue and sometimes feels the need to nap.  Her eating habits are poor, often consuming only one meal a day, and she has unintentionally lost over ten pounds. Despite this, she is concerned about potential weight gain from medications. She is currently on Yasmin  birth control and hydroxyzine  10 mg as needed, three times a day.  Denies current suicidal thoughts or plans.  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 She is more  active on weekends, contrasting with her previous sedentary lifestyle.  She is currently seeing a therapist, Sueann Embs, every other month .    Visit Diagnosis:    ICD-10-CM   1. MDD (major depressive disorder), recurrent episode, moderate (HCC)  F33.1     2. Bereavement  Z63.4     3. Generalized anxiety disorder  F41.1 escitalopram  (LEXAPRO ) 10 MG tablet    4. Fatigue, unspecified type  R53.83 Ambulatory referral to Pulmonology      Past Psychiatric History: I have reviewed past psychiatric history from progress note on 11/18/2022.  Past trials of trazodone , zolpidem.She has a history of a suicide attempt in March 2020 involving an overdose of pills, which led her to seek medical help  Past Medical History:  Past Medical History:  Diagnosis Date   Anxiety    Depression    H/O bilateral breast reduction surgery    History of left oophorectomy 2009   due to ovarian cyst   Lymphadenopathy 2009   left leg   Lymphedema of left leg    S/P tonsillectomy    Suicidal ideation 03/06/2019   Von Willebrand disease (HCC) 2009    Past Surgical History:  Procedure Laterality Date   BREAST REDUCTION SURGERY  2004   LOWER EXTREMITY VENOGRAPHY Left 09/24/2020   Procedure: LOWER EXTREMITY VENOGRAPHY;  Surgeon: Celso College, MD;  Location: [facility] INVASIVE CV LAB;  Service: Cardiovascular;  Laterality: Left;   OVARIAN CYST REMOVAL     REDUCTION MAMMAPLASTY     TONSILLECTOMY      Family Psychiatric History: I have reviewed family psychiatric history from progress note on 11/18/2022.  Family History:  Family History  Problem Relation Age of Onset   Depression Mother    Diabetes Mother    Hypertension Father    Breast cancer Neg Hx    Ovarian cancer Neg Hx    Colon cancer Neg Hx     Social History: I have reviewed social history from progress note on 11/18/2022. Social History   Socioeconomic History   Marital status: Single    Spouse name: Not on file   Number of  children: Not on file   Years of education: Not on file   Highest education level: Some college, no degree  Occupational History   Not on file  Tobacco Use   Smoking status: Never   Smokeless tobacco: Never  Vaping Use   Vaping status: Never Used  Substance and Sexual Activity   Alcohol use: Not Currently    Comment: rare   Drug use: No   Sexual activity: Not Currently    Birth control/protection: Pill  Other Topics Concern   Not on file  Social History Narrative   Not on file   Social Drivers of Health   Financial Resource Strain: Low Risk  (11/07/2022)   Overall Financial Resource Strain (CARDIA)    Difficulty of Paying Living Expenses: Not hard at all  Food Insecurity: No Food Insecurity (11/07/2022)   Hunger Vital Sign    Worried About Running Out of Food in the Last Year: Never true    Ran Out of Food in the Last Year: Never true  Transportation Needs: No Transportation Needs (11/07/2022)   PRAPARE - Administrator, Civil Service (Medical): No    Lack of Transportation (Non-Medical): No  Physical Activity: Not on file  Stress: Not on file  Social Connections: Not on file    Allergies:  Allergies  Allergen Reactions   Salmon [Fish Oil]    Amoxicillin Rash   Percocet [Oxycodone-Acetaminophen ] Nausea And Vomiting and Rash    Metabolic Disorder Labs: Lab Results  Component Value Date   HGBA1C 5.3 03/08/2019   MPG 105.41 03/08/2019   No results found for: "PROLACTIN" Lab Results  Component Value Date   CHOL 148 03/08/2019   TRIG 115 03/08/2019   HDL 52 03/08/2019   CHOLHDL 2.8 03/08/2019   VLDL 23 03/08/2019   LDLCALC 73 03/08/2019   Lab Results  Component Value Date   TSH 0.989 11/18/2022   TSH 1.201 03/08/2019    Therapeutic Level Labs: No results found for: "LITHIUM" No results found for: "VALPROATE" No results found for: "CBMZ"  Current Medications: Current Outpatient Medications  Medication Sig Dispense Refill   albuterol   (VENTOLIN  HFA) 108 (90 Base) MCG/ACT inhaler TAKE 2 PUFFS BY MOUTH EVERY 6 HOURS AS NEEDED FOR WHEEZE OR SHORTNESS OF BREATH 3 each 1   cetirizine  (ZYRTEC  ALLERGY) 10 MG tablet Take 1 tablet (10 mg total) by mouth daily. 90 tablet 1   drospirenone -ethinyl estradiol  (YASMIN  28) 3-0.03 MG tablet Take 1 tablet by mouth daily. 84 tablet 3   fluticasone  (FLONASE ) 50 MCG/ACT nasal spray Place 2 sprays into both nostrils daily. 16 g 6   hydrOXYzine  (ATARAX ) 10 MG tablet Take 1 tablet (10 mg total) by mouth 3 (three) times daily as needed for anxiety. 270 tablet 0   mirtazapine  (REMERON ) 30 MG tablet TAKE 1 TABLET BY MOUTH EVERYDAY AT BEDTIME 90 tablet 1   oseltamivir  (TAMIFLU ) 75 MG capsule Take 1 capsule (75 mg total) by mouth every 12 (twelve) hours. 10  capsule 0   promethazine -dextromethorphan (PROMETHAZINE -DM) 6.25-15 MG/5ML syrup Take 5 mLs by mouth 4 (four) times daily as needed for cough. 118 mL 0   escitalopram  (LEXAPRO ) 10 MG tablet Take 1 tablet (10 mg total) by mouth daily with breakfast. 90 tablet 0   No current facility-administered medications for this visit.     Musculoskeletal: Strength & Muscle Tone: within normal limits Gait & Station: normal Patient leans: N/A  Psychiatric Specialty Exam: Review of Systems  Psychiatric/Behavioral:  Positive for dysphoric mood and sleep disturbance. The patient is nervous/anxious.     Blood pressure 118/84, pulse 94, temperature 98.4 F (36.9 C), temperature source Temporal, height 5\' 1"  (1.549 m), weight 216 lb 9.6 oz (98.2 kg), last menstrual period 01/30/2024, SpO2 100%.Body mass index is 40.93 kg/m.  General Appearance: Casual  Eye Contact:  Fair  Speech:  Clear and Coherent  Volume:  Normal  Mood:  Anxious and Depressed  Affect:  Congruent  Thought Process:  Goal Directed and Descriptions of Associations: Intact  Orientation:  Full (Time, Place, and Person)  Thought Content: Logical   Suicidal Thoughts:  No  Homicidal Thoughts:  No   Memory:  Immediate;   Fair Recent;   Fair Remote;   Fair  Judgement:  Fair  Insight:  Fair  Psychomotor Activity:  Normal  Concentration:  Concentration: Fair and Attention Span: Fair  Recall:  [employer] of Knowledge: Fair  Language: Fair  Akathisia:  No  Handed:  Right  AIMS (if indicated): not done  Assets:  Communication Skills Desire for Improvement Housing Social Support  ADL's:  Intact  Cognition: WNL  Sleep:  Poor   Screenings: AIMS    Flowsheet Row Admission (Discharged) from 03/07/2019 in [facility]ICINE  AIMS Total Score 0      AUDIT    Flowsheet Row Admission (Discharged) from 03/07/2019 in [facility] INPATIENT BEHAVIORAL MEDICINE  Alcohol Use Disorder Identification Test Final Score (AUDIT) 0      GAD-7    Flowsheet Row Office Visit from 05/19/2024 in [facility] Office Visit from 12/25/2023 in [facility] Primary Care & Sports Medicine at [facility] Video Visit from 06/30/2023 in [facility] Office Visit from 02/11/2023 in [facility] Office Visit from 12/30/2022 in [facility]  Total GAD-7 Score 11 0 8 13 14       PHQ2-9    Flowsheet Row Office Visit from 05/19/2024 in [facility] Office Visit from 12/25/2023 in [facility] at [facility] Video Visit from 06/30/2023 in [facility] Video Visit from 03/25/2023 in [facility] Office Visit from 02/11/2023 in [facility]  PHQ-2 Total Score 3 0 0 0 2  PHQ-9 Total Score 14 1 -- -- 13      Flowsheet Row Office Visit from 05/19/2024 in [facility] UC from 01/31/2024 in [facility]  UC  from 11/20/2023 in [facility] Urgent Care at [facility]   C-SSRS RISK CATEGORY Moderate Risk No Risk No Risk        Assessment and Plan: Mallory Johnston is a 33 year old African-American female with history of depression, anxiety, bereavement was evaluated in office today, discussed assessment and plan as noted below.  MDD-unstable Currently struggling with depression symptoms, low energy,  sadness, lack of motivation and sleep issues.  Currently not compliant on Lexapro  however is interested in going back on it. - Restart Lexapro  10 mg daily. - Continue Mirtazapine  30 mg at bedtime - Continue CBT Ms. Tenisha Castellanos  GAD-unstable Continues to have anxiety related symptoms and is interested in going back on the Lexapro .  Lexapro  may have helped. - Restart Lexapro  10 mg daily with plan to increase the dosage gradually. - Continue Hydroxyzine  10 mg 3 times a day as needed - Continue CBT  Bereavement-unstable Recent triggers worsened grief especially around Mother's Day.  Agreeable to continue psychotherapy sessions. - Continue CBT.  Fatigue unspecified-unstable Continues to struggle with fatigue, interrupted sleep.   - Ambulatory referral to pulmonology for sleep study to rule out sleep apnea.  Follow-up Follow-up in clinic in 8 weeks or sooner if needed.   Collaboration of Care: Collaboration of Care: Referral or follow-up with counselor/therapist AEB encouraged to continue CBT  Patient/Guardian was advised Release of Information must be obtained prior to any record release in order to collaborate their care with an outside provider. Patient/Guardian was advised if they have not already done so to contact the registration department to sign all necessary forms in order for us  to release information regarding their care.   Consent: Patient/Guardian gives verbal consent for treatment and assignment of benefits for services provided during this visit. Patient/Guardian expressed  understanding and agreed to proceed.   This note was generated in part or whole with voice recognition software. Voice recognition is usually quite accurate but there are transcription errors that can and very often do occur. I apologize for any typographical errors that were not detected and corrected.    Latacha Texeira, MD 05/19/2024, 2:48 PM

## 2024-05-29 DIAGNOSIS — N8003 Adenomyosis of the uterus: Secondary | ICD-10-CM

## 2024-05-29 HISTORY — DX: Adenomyosis of the uterus: N80.03

## 2024-05-31 DIAGNOSIS — N939 Abnormal uterine and vaginal bleeding, unspecified: Secondary | ICD-10-CM | POA: Diagnosis not present

## 2024-05-31 DIAGNOSIS — N8003 Adenomyosis of the uterus: Secondary | ICD-10-CM | POA: Diagnosis not present

## 2024-05-31 DIAGNOSIS — E282 Polycystic ovarian syndrome: Secondary | ICD-10-CM | POA: Diagnosis not present

## 2024-06-01 ENCOUNTER — Encounter: Payer: Self-pay | Admitting: Sleep Medicine

## 2024-06-01 ENCOUNTER — Ambulatory Visit: Payer: Self-pay | Admitting: Psychiatry

## 2024-06-01 ENCOUNTER — Ambulatory Visit: Admitting: Sleep Medicine

## 2024-06-01 VITALS — BP 126/80 | HR 97 | Temp 98.2°F | Ht 61.0 in | Wt 219.2 lb

## 2024-06-01 DIAGNOSIS — Z6841 Body Mass Index (BMI) 40.0 and over, adult: Secondary | ICD-10-CM | POA: Diagnosis not present

## 2024-06-01 DIAGNOSIS — F411 Generalized anxiety disorder: Secondary | ICD-10-CM

## 2024-06-01 DIAGNOSIS — G4733 Obstructive sleep apnea (adult) (pediatric): Secondary | ICD-10-CM

## 2024-06-01 NOTE — Patient Instructions (Signed)
 Mallory Johnston

## 2024-06-01 NOTE — Progress Notes (Signed)
 Mallory Johnston MRN: [id number] DOB: 05-Nov-1991   CHIEF COMPLAINT:  EXCESSIVE DAYTIME SLEEPINESS   HISTORY OF PRESENT ILLNESS:  Mallory Johnston is Johnston 33 y.o. w/ Johnston h/o anxiety, depression and morbid obesity who presents for c/o loud snoring, witnessed apnea and excessive daytime sleepiness which has been present for several years. Reports nocturnal awakenings due to unclear reasons, and occasionally has difficulty falling back to sleep. Reports Johnston 20-30 lb weight gain over the last few years. Admits to night sweats. Denies morning headaches, RLS symptoms, dream enactment, cataplexy, hypnagogic or hypnapompic hallucinations. Reports Johnston family history of sleep apnea. Admits to drowsy driving. Drinks 1 cup of coffee and 1 soda daily, occasional alcohol use, denies tobacco or illicit drug use.   Bedtime 9:30-10 pm Sleep onset 30-60 mins Rise time 5:30-6:40 am   EPWORTH SLEEP SCORE 14    06/01/2024    3:39 PM  Results of the Epworth flowsheet  Sitting and reading 2  Watching TV 3  Sitting, inactive in Johnston public place (e.g. Johnston theatre or Johnston meeting) 2  As Johnston passenger in Johnston car for an hour without Johnston break 3  Lying down to rest in the afternoon when circumstances permit 2  Sitting and talking to someone 1  Sitting quietly after Johnston lunch without alcohol 1  In Johnston car, while stopped for Johnston few minutes in traffic 0  Total score 14    PAST MEDICAL HISTORY :   has Johnston past medical history of Anxiety, Depression, H/O bilateral breast reduction surgery, History of left oophorectomy (2009), Lymphadenopathy (2009), Lymphedema of left leg, S/P tonsillectomy, Suicidal ideation (03/06/2019), and Von Willebrand disease (HCC) (2009).  has Johnston past surgical history that includes Tonsillectomy; Breast reduction surgery (2004); Ovarian cyst removal; LOWER EXTREMITY VENOGRAPHY (Left, 09/24/2020); and Reduction mammaplasty. Prior to Admission medications   Medication Sig Start Date End Date Taking? Authorizing  Provider  albuterol  (VENTOLIN  HFA) 108 (90 Base) MCG/ACT inhaler TAKE 2 PUFFS BY MOUTH EVERY 6 HOURS AS NEEDED FOR WHEEZE OR SHORTNESS OF BREATH 02/29/24  Yes Mallory Dixons, MD  cetirizine  (ZYRTEC  ALLERGY) 10 MG tablet Take 1 tablet (10 mg total) by mouth daily. 11/02/22  Yes Hawks, Christy A, FNP  drospirenone -ethinyl estradiol  (YASMIN  28) 3-0.03 MG tablet Take 1 tablet by mouth daily. 03/25/23  Yes Mallory Fender, MD  escitalopram  (LEXAPRO ) 10 MG tablet Take 1 tablet (10 mg total) by mouth daily with breakfast. 05/19/24  Yes Eappen, Saramma, MD  fluticasone  (FLONASE ) 50 MCG/ACT nasal spray Place 2 sprays into both nostrils daily. 11/02/22  Yes Hawks, Christy A, FNP  hydrOXYzine  (ATARAX ) 10 MG tablet Take 1 tablet (10 mg total) by mouth 3 (three) times daily as needed for anxiety. 09/10/23  Yes Eappen, Saramma, MD  mirtazapine  (REMERON ) 30 MG tablet TAKE 1 TABLET BY MOUTH EVERYDAY AT BEDTIME 04/18/24  Yes Mallory Bachelor, MD  promethazine -dextromethorphan (PROMETHAZINE -DM) 6.25-15 MG/5ML syrup Take 5 mLs by mouth 4 (four) times daily as needed for cough. 01/31/24  Yes Mallory Canning, NP   Allergies  Allergen Reactions   Salmon [Fish Oil]    Amoxicillin Rash and Hives   Latex Rash   Percocet [Oxycodone-Acetaminophen ] Nausea And Vomiting and Rash    FAMILY HISTORY:  family history includes Depression in her mother; Diabetes in her mother; Hypertension in her father. SOCIAL HISTORY:  reports that she has never smoked. She has never used smokeless tobacco. She reports that she does not currently use  alcohol. She reports that she does not use drugs.   Review of Systems:  Gen:  Denies  fever, sweats, chills weight loss  HEENT: Denies blurred vision, double vision, ear pain, eye pain, hearing loss, nose bleeds, sore throat Cardiac:  No dizziness, chest pain or heaviness, chest tightness,edema, No JVD Resp:   No cough, -sputum production, -shortness of breath,-wheezing, -hemoptysis,  Gi: Denies  swallowing difficulty, stomach pain, nausea or vomiting, diarrhea, constipation, bowel incontinence Gu:  Denies bladder incontinence, burning urine Ext:   Denies Joint pain, stiffness or swelling Skin: Denies  skin rash, easy bruising or bleeding or hives Endoc:  Denies polyuria, polydipsia , polyphagia or weight change Psych:   Denies depression, insomnia or hallucinations  Other:  All other systems negative  VITAL SIGNS: BP 126/80 (BP Location: Left Arm, Patient Position: Sitting, Cuff Size: Normal)   Pulse 97   Temp 98.2 F (36.8 C) (Oral)   Ht 5\' 1"  (1.549 m)   Wt 219 lb 3.2 oz (99.4 kg)   LMP 01/30/2024 (Approximate)   SpO2 97%   BMI 41.42 kg/m    Physical Examination:   General Appearance: No distress  EYES PERRLA, EOM intact.   NECK Supple, No JVD Pulmonary: normal breath sounds, No wheezing.  CardiovascularNormal S1,S2.  No m/r/g.   Abdomen: Benign, Soft, non-tender. Skin:   warm, no rashes, no ecchymosis  Extremities: normal, no cyanosis, clubbing. Neuro:without focal findings,  speech normal  PSYCHIATRIC: Mood, affect within normal limits.   ASSESSMENT AND PLAN  OSA I suspect that OSA is likely present due to clinical presentation. Discussed the consequences of untreated sleep apnea. Advised not to drive drowsy for safety of patient and others. Will complete further evaluation with Johnston home sleep study and follow up to review results.    HTN Stable, on current management. Following with PCP.   Morbid obesity Counseled patient on diet and lifestyle modification.    MEDICATION ADJUSTMENTS/LABS AND TESTS ORDERED: Recommend Sleep Study   Patient  satisfied with Plan of action and management. All questions answered  Follow up to review HST results and treatment plan.   I spent Johnston total of 30 minutes reviewing chart data, face-to-face evaluation with the patient, counseling and coordination of care as detailed above.    Mallory Johnston, M.D.  Sleep  Medicine [facility]

## 2024-06-09 DIAGNOSIS — Z3046 Encounter for surveillance of implantable subdermal contraceptive: Secondary | ICD-10-CM | POA: Diagnosis not present

## 2024-06-30 ENCOUNTER — Encounter: Payer: Self-pay | Admitting: *Deleted

## 2024-06-30 ENCOUNTER — Ambulatory Visit: Admission: EM | Admit: 2024-06-30 | Discharge: 2024-06-30 | Disposition: A | Discharge status: Home / Self Care

## 2024-06-30 DIAGNOSIS — S30860A Insect bite (nonvenomous) of lower back and pelvis, initial encounter: Secondary | ICD-10-CM | POA: Diagnosis not present

## 2024-06-30 DIAGNOSIS — W57XXXA Bitten or stung by nonvenomous insect and other nonvenomous arthropods, initial encounter: Secondary | ICD-10-CM | POA: Diagnosis not present

## 2024-06-30 DIAGNOSIS — L989 Disorder of the skin and subcutaneous tissue, unspecified: Secondary | ICD-10-CM

## 2024-06-30 NOTE — ED Provider Notes (Signed)
 Mallory Johnston    CSN: [id number] Arrival date & time: 06/30/24  1716      History   Chief Complaint Chief Complaint  Patient presents with   Tick Removal    HPI Mallory Johnston is a 33 y.o. female.  Patient presents with a lesion on her right upper back from a tick bite 6 days ago.  The tick was removed intact.  The area is pruritic.  Patient denies fever, chills, wound drainage, rash, headache, vomiting, diarrhea.  No OTC medication taken today.  The history is provided by the patient and medical records.    Past Medical History:  Diagnosis Date   Adenomyosis 05/2024   Anxiety    Depression    H/O bilateral breast reduction surgery    History of left oophorectomy 2009   due to ovarian cyst   Lymphadenopathy 2009   left leg   Lymphedema of left leg    S/P tonsillectomy    Suicidal ideation 03/06/2019   Von Willebrand disease (HCC) 2009    Patient Active Problem List   Diagnosis Date Noted   Fatigue 05/19/2024   Hx of von Willebrand's disease 12/08/2022   MDD (major depressive disorder), recurrent episode, moderate (HCC) 11/18/2022   Bereavement 11/18/2022   Anxiety disorder 11/18/2022   Heavy menstrual period 10/14/2022   Migraine without aura and without status migrainosus, not intractable 01/11/2021   Swelling of limb 11/29/2019   Lymphedema 11/29/2019   Class 1 obesity due to excess calories without serious comorbidity with body mass index (BMI) of 30.0 to 30.9 in adult 02/11/2017   History of cervical dysplasia 02/11/2017   Liver lesion, right lobe 10/17/2016   PCOS (polycystic ovarian syndrome) 09/20/2015    Past Surgical History:  Procedure Laterality Date   BREAST REDUCTION SURGERY  2004   LOWER EXTREMITY VENOGRAPHY Left 09/24/2020   Procedure: LOWER EXTREMITY VENOGRAPHY;  Surgeon: Marea Selinda RAMAN, MD;  Location: [facility];  Service: Cardiovascular;  Laterality: Left;   OVARIAN CYST REMOVAL     REDUCTION MAMMAPLASTY     TONSILLECTOMY       OB History     Gravida  2   Para      Term      Preterm      AB  2   Living         SAB  2   IAB      Ectopic      Multiple      Live Births               Home Medications    Prior to Admission medications   Medication Sig Start Date End Date Taking? Authorizing Provider  albuterol  (VENTOLIN  HFA) 108 (90 Base) MCG/ACT inhaler TAKE 2 PUFFS BY MOUTH EVERY 6 HOURS AS NEEDED FOR WHEEZE OR SHORTNESS OF BREATH 02/29/24   Justus Leita DEL, MD  cetirizine  (ZYRTEC  ALLERGY) 10 MG tablet Take 1 tablet (10 mg total) by mouth daily. 11/02/22   Lavell Bari LABOR, FNP  drospirenone -ethinyl estradiol  (YASMIN  28) 3-0.03 MG tablet Take 1 tablet by mouth daily. 03/25/23   Connell Davies, MD  escitalopram  (LEXAPRO ) 10 MG tablet Take 1 tablet (10 mg total) by mouth daily with breakfast. 05/19/24   Eappen, Saramma, MD  fluticasone  (FLONASE ) 50 MCG/ACT nasal spray Place 2 sprays into both nostrils daily. 11/02/22   Lavell Bari A, FNP  hydrOXYzine  (ATARAX ) 10 MG tablet Take 1 tablet (10 mg total) by mouth  3 (three) times daily as needed for anxiety. 09/10/23   Eappen, Saramma, MD  mirtazapine  (REMERON ) 30 MG tablet TAKE 1 TABLET BY MOUTH EVERYDAY AT BEDTIME 04/18/24   Okey Barnie SAUNDERS, MD  promethazine -dextromethorphan (PROMETHAZINE -DM) 6.25-15 MG/5ML syrup Take 5 mLs by mouth 4 (four) times daily as needed for cough. 01/31/24   Teresa Shelba SAUNDERS, NP    Family History Family History  Problem Relation Age of Onset   Depression Mother    Diabetes Mother    Hypertension Father    Breast cancer Neg Hx    Ovarian cancer Neg Hx    Colon cancer Neg Hx     Social History Social History   Tobacco Use   Smoking status: Never   Smokeless tobacco: Never  Vaping Use   Vaping status: Never Used  Substance Use Topics   Alcohol use: Not Currently    Comment: rare   Drug use: No     Allergies   Salmon [fish oil], Amoxicillin, Latex, and Percocet [oxycodone-acetaminophen ]   Review of  Systems Review of Systems  Constitutional:  Negative for chills and fever.  Gastrointestinal:  Negative for diarrhea, nausea and vomiting.  Musculoskeletal:  Negative for joint swelling and myalgias.  Skin:  Positive for wound. Negative for color change and rash.  Neurological:  Negative for weakness and numbness.     Physical Exam Triage Vital Signs ED Triage Vitals  Encounter Vitals Group     BP 06/30/24 1745 114/79     Girls Systolic BP Percentile --      Girls Diastolic BP Percentile --      Boys Systolic BP Percentile --      Boys Diastolic BP Percentile --      Pulse Rate 06/30/24 1745 96     Resp 06/30/24 1745 18     Temp 06/30/24 1745 97.9 F (36.6 C)     Temp src --      SpO2 06/30/24 1745 97 %     Weight 06/30/24 1750 215 lb (97.5 kg)     Height 06/30/24 1750 5' 1 (1.549 m)     Head Circumference --      Peak Flow --      Pain Score 06/30/24 1750 0     Pain Loc --      Pain Education --      Exclude from Growth Chart --    No data found.  Updated Vital Signs BP 114/79   Pulse 96   Temp 97.9 F (36.6 C)   Resp 18   Ht 5' 1 (1.549 m)   Wt 215 lb (97.5 kg)   SpO2 97%   BMI 40.62 kg/m   Visual Acuity Right Eye Distance:   Left Eye Distance:   Bilateral Distance:    Right Eye Near:   Left Eye Near:    Bilateral Near:     Physical Exam Constitutional:      General: She is not in acute distress. HENT:     Mouth/Throat:     Mouth: Mucous membranes are moist.  Cardiovascular:     Rate and Rhythm: Normal rate and regular rhythm.  Pulmonary:     Effort: Pulmonary effort is normal. No respiratory distress.  Skin:    General: Skin is warm and dry.     Findings: Lesion present. No erythema or rash.     Comments: Right upper back: 1 cm superficial area of induration with central 1 mm lesion. No foreign body noted.  No purulent drainage or erythema.    Neurological:     Mental Status: She is alert.      UC Treatments / Results  Labs (all labs  ordered are listed, but only abnormal results are displayed) Labs Reviewed - No data to display  EKG   Radiology No results found.  Procedures Procedures (including critical care time)  Medications Ordered in UC Medications - No data to display  Initial Impression / Assessment and Plan / UC Course  I have reviewed the triage vital signs and the nursing notes.  Pertinent labs & imaging results that were available during my care of the patient were reviewed by me and considered in my medical decision making (see chart for details).    Skin lesion due to tick bite of right upper back.  Afebrile and vital signs are stable.  No erythema or rash.  No foreign body noted.  Education provided on tick bites.  Patient declines lab work at this time.  Instructed her to follow-up with her PCP.  Instructed her to follow-up right away if she has concerning symptoms such as fever or rash.  She agrees to plan of care.  Final Clinical Impressions(s) / UC Diagnoses   Final diagnoses:  Tick bite of back, initial encounter  Skin lesion of back     Discharge Instructions      See the attached information on tick bites.  Follow up with your primary care provider.  Follow-up right away if you have concerning symptoms such as fever or rash.     ED Prescriptions   None    PDMP not reviewed this encounter.   Corlis Burnard DEL, NP 06/30/24 1901

## 2024-06-30 NOTE — ED Triage Notes (Signed)
 Patient states tick was removed from her upper back on 6 days ago and area continues to be really itchy.  Wants to make sure it is out and site is ok.

## 2024-06-30 NOTE — Discharge Instructions (Addendum)
 See the attached information on tick bites.  Follow up with your primary care provider.  Follow-up right away if you have concerning symptoms such as fever or rash.

## 2024-07-11 ENCOUNTER — Encounter: Payer: Self-pay | Admitting: Psychiatry

## 2024-07-11 ENCOUNTER — Telehealth (INDEPENDENT_AMBULATORY_CARE_PROVIDER_SITE_OTHER): Admitting: Psychiatry

## 2024-07-11 DIAGNOSIS — Z634 Disappearance and death of family member: Secondary | ICD-10-CM

## 2024-07-11 DIAGNOSIS — F331 Major depressive disorder, recurrent, moderate: Secondary | ICD-10-CM

## 2024-07-11 DIAGNOSIS — F411 Generalized anxiety disorder: Secondary | ICD-10-CM

## 2024-07-11 DIAGNOSIS — Z9189 Other specified personal risk factors, not elsewhere classified: Secondary | ICD-10-CM | POA: Diagnosis not present

## 2024-07-11 NOTE — Patient Instructions (Signed)
Please call for EKG - [phone]   Aripiprazole Tablets What is this medication? ARIPIPRAZOLE (ay ri PIP ray zole) treats schizophrenia, bipolar I disorder, autism spectrum disorder, and Tourette disorder. It may also be used with antidepressant medications to treat depression. It works by balancing the levels of dopamine and serotonin in the brain, hormones that help regulate mood, behaviors, and thoughts. It belongs to a group of medications called antipsychotics. Antipsychotics can be used to treat several kinds of mental health conditions. This medicine may be used for other purposes; ask your health care provider or pharmacist if you have questions. COMMON BRAND NAME(S): Abilify What should I tell my care team before I take this medication? They need to know if you have any of these conditions: Dementia Diabetes Difficulty swallowing Have trouble controlling your muscles Heart disease History of irregular heartbeat History of stroke Low blood cell levels (white cells, red cells, and platelets) Low blood pressure Parkinson disease Seizures Suicidal thoughts, plans, or attempt by you or a family member Urges to engage in impulsive behaviors in ways that are unusual for you An unusual or allergic reaction to aripiprazole, other medications, foods, dyes, or preservatives Pregnant or trying to get pregnant Breastfeeding How should I use this medication? Take this medication by mouth with a glass of water. Take it as directed on the prescription label at the same time every day. You can take it with or without food. If it upsets your stomach, take it with food. Do not take your medication more often than directed. Keep taking it unless your care team tells you to stop. A special MedGuide will be given to you by the pharmacist with each prescription and refill. Be sure to read this information carefully each time. Talk to your care team about the use of this medication in children.  While it may be prescribed for children as young as 6 years for selected conditions, precautions do apply. Overdosage: If you think you have taken too much of this medicine contact a poison control center or emergency room at once. NOTE: This medicine is only for you. Do not share this medicine with others. What if I miss a dose? If you miss a dose, take it as soon as you can. If it is almost time for your next dose, take only that dose. Do not take double or extra doses. What may interact with this medication? Do not take this medication with any of the following: Brexpiprazole Cisapride Dextromethorphan; quinidine Dronedarone Metoclopramide Pimozide Quinidine Thioridazine This medication may also interact with the following: Antihistamines for allergy, cough, and cold Carbamazepine Certain medications for anxiety or sleep Certain medications for depression, such as amitriptyline, fluoxetine, paroxetine, or sertraline Certain medications for fungal infections, such as fluconazole, itraconazole, ketoconazole, posaconazole, or voriconazole Clarithromycin General anesthetics, such as halothane, isoflurane, methoxyflurane, or propofol Medications for Parkinson disease, such as levodopa Medications for blood pressure Medications for seizures Medications that relax muscles for surgery Opioid medications for pain Other medications that cause heart rhythm changes Phenothiazines, such as chlorpromazine or prochlorperazine Rifampin This list may not describe all possible interactions. Give your health care provider a list of all the medicines, herbs, non-prescription drugs, or dietary supplements you use. Also tell them if you smoke, drink alcohol, or use illegal drugs. Some items may interact with your medicine. What should I watch for while using this medication? Visit your care team for regular checks on your progress. Tell your care team if your symptoms do not start  to get better or if  they get worse. Do not suddenly stop taking this medication. You may develop a severe reaction. Your care team will tell you how much medication to take. If your care team wants you to stop the medication, the dose may be slowly lowered over time to avoid any side effects. Patients and their families should watch out for new or worsening depression or thoughts of suicide. Also watch out for sudden changes in feelings such as feeling anxious, agitated, panicky, irritable, hostile, aggressive, impulsive, severely restless, overly excited and hyperactive, or not being able to sleep. If this happens, especially at the beginning of antidepressant treatment or after a change in dose, call your care team. This medication may affect your coordination, reaction time, or judgment. Do not drive or operate machinery until you know how this medication affects you. Sit up or stand slowly to reduce the risk of dizzy or fainting spells. Drinking alcohol with this medication can increase the risk of these side effects. This medication can cause problems with controlling your body temperature. It can lower the response of your body to cold temperatures. If possible, stay indoors during cold weather. If you must go outdoors, wear warm clothes. It can also lower the response of your body to heat. Do not overheat. Do not over-exercise. Stay out of the sun when possible. If you must be in the sun, wear cool clothing. Drink plenty of water. If you have trouble controlling your body temperature, call your care team right away. This medication may cause dry eyes and blurred vision. If you wear contact lenses, you may feel some discomfort. Lubricating eye drops may help. See your care team if the problem does not go away or is severe. This medication may increase blood sugar. Ask your care team if changes in diet or medications are needed if you have diabetes. There have been reports of increased sexual urges or other strong urges such  as gambling while taking this medication. If you experience any of these while taking this medication, you should report this to your care team as soon as possible. What side effects may I notice from receiving this medication? Side effects that you should report to your care team as soon as possible: Allergic reactions--skin rash, itching, hives, swelling of the face, lips, tongue, or throat High blood sugar (hyperglycemia)--increased thirst or amount of urine, unusual weakness or fatigue, blurry vision High fever, stiff muscles, increased sweating, fast or irregular heartbeat, and confusion, which may be signs of neuroleptic malignant syndrome Low blood pressure--dizziness, feeling faint or lightheaded, blurry vision Pain or trouble swallowing Prolonged or painful erection Seizures Stroke--sudden numbness or weakness of the face, arm, or leg, trouble speaking, confusion, trouble walking, loss of balance or coordination, dizziness, severe headache, change in vision Uncontrolled and repetitive body movements, muscle stiffness or spasms, tremors or shaking, loss of balance or coordination, restlessness, shuffling walk, which may be signs of extrapyramidal symptoms (EPS) Thoughts of suicide or self-harm, worsening mood, feelings of depression Urges to engage in impulsive behaviors such as gambling, binge eating, sexual activity, or shopping in ways that are unusual for you Side effects that usually do not require medical attention (report these to your care team if they continue or are bothersome): Constipation Drowsiness Weight gain This list may not describe all possible side effects. Call your doctor for medical advice about side effects. You may report side effects to FDA at 1-800-FDA-1088. Where should I keep my medication? Keep out of  the reach of children and pets. Store at room temperature between 15 and 30 degrees C (59 and 86 degrees F). Throw away any unused medication after the  expiration date. NOTE: This sheet is a summary. It may not cover all possible information. If you have questions about this medicine, talk to your doctor, pharmacist, or health care provider.  2024 Elsevier/Gold Standard (2022-07-05 00:00:00)

## 2024-07-11 NOTE — Progress Notes (Unsigned)
 Virtual Visit via Video Note  I connected with Mallory Johnston on 07/11/24 at  1:30 PM EDT by a video enabled telemedicine application and verified that I am speaking with the correct person using two identifiers.  Location Provider Location : [facility] Patient Location : Work  Participants: Patient , Provider   I discussed the limitations of evaluation and management by telemedicine and the availability of in person appointments. The patient expressed understanding and agreed to proceed.   I discussed the assessment and treatment plan with the patient. The patient was provided an opportunity to ask questions and all were answered. The patient agreed with the plan and demonstrated an understanding of the instructions.   The patient was advised to call back or seek an in-person evaluation if the symptoms worsen or if the condition fails to improve as anticipated.  BH MD OP Progress Note  07/13/2024 4:38 AM KYRI SHADER  MRN:  [id number]  Chief Complaint:  Chief Complaint  Patient presents with   Follow-up   Anxiety   Depression   Discussed the use of AI scribe software for clinical note transcription with the patient, who gave verbal consent to proceed.  History of Present Illness Mallory Johnston is a 33 year old African-American female, employed, lives in [place], has a history of MDD, bereavement, GAD, von Willebrand disease, PCOS was evaluated by telemedicine today.  She has experienced a significant decrease in appetite over the past month, coinciding with a recent promotion at work. She eats only one meal a day and sometimes does not feel hungry at all. Friends have noticed her eating less during social outings.  She is currently taking Lexapro  and mirtazapine  for depression, but these medications have not increased her appetite as expected. She has been on these medications prior to the onset of her decreased appetite.  Denies suicidal thoughts, but she feels lonely and  anxious, particularly after a recent breakup. She reports that her anxiety has been affecting her stomach.  Her sleep is variable, and financial constraints have prevented her from undergoing a sleep study. She is unable to continue therapy with Ms.Leslee Salt due to scheduling and financial issues, as her therapist does not accept her insurance.  She denies significant sadness or hopelessness but feels upset and alone, particularly due to her relationship status and concerns about finding a partner. Despite these feelings, she manages her work responsibilities by separating personal issues from her professional life.   Visit Diagnosis:    ICD-10-CM   1. MDD (major depressive disorder), recurrent episode, moderate (HCC)  F33.1 EKG 12-Lead    2. Bereavement  Z63.4     3. Generalized anxiety disorder  F41.1 EKG 12-Lead    4. At risk for prolonged QT interval syndrome  Z91.89 EKG 12-Lead      Past Psychiatric History: I have reviewed past psychiatric history from progress note on 11/18/2022.  Past trials of trazodone , zolpidem.  She has a history of suicide attempt in March 2020 involving an overdose of pills.  Past Medical History:  Past Medical History:  Diagnosis Date   Adenomyosis 05/2024   Anxiety    Depression    H/O bilateral breast reduction surgery    History of left oophorectomy 2009   due to ovarian cyst   Lymphadenopathy 2009   left leg   Lymphedema of left leg    S/P tonsillectomy    Suicidal ideation 03/06/2019   Von Willebrand disease (HCC) 2009    Past Surgical  History:  Procedure Laterality Date   BREAST REDUCTION SURGERY  2004   LOWER EXTREMITY VENOGRAPHY Left 09/24/2020   Procedure: LOWER EXTREMITY VENOGRAPHY;  Surgeon: Marea Selinda RAMAN, MD;  Location: [facility] INVASIVE CV LAB;  Service: Cardiovascular;  Laterality: Left;   OVARIAN CYST REMOVAL     REDUCTION MAMMAPLASTY     TONSILLECTOMY      Family Psychiatric History: I reviewed family psychiatric  history from progress note on 11/18/2022.  Family History:  Family History  Problem Relation Age of Onset   Depression Mother    Diabetes Mother    Hypertension Father    Breast cancer Neg Hx    Ovarian cancer Neg Hx    Colon cancer Neg Hx     Social History: I have reviewed social history from progress note on 11/18/2022. Social History   Socioeconomic History   Marital status: Single    Spouse name: Not on file   Number of children: Not on file   Years of education: Not on file   Highest education level: Some college, no degree  Occupational History   Not on file  Tobacco Use   Smoking status: Never   Smokeless tobacco: Never  Vaping Use   Vaping status: Never Used  Substance and Sexual Activity   Alcohol use: Not Currently    Comment: rare   Drug use: No   Sexual activity: Not Currently    Birth control/protection: Pill  Other Topics Concern   Not on file  Social History Narrative   Not on file   Social Drivers of Health   Financial Resource Strain: Low Risk  (11/07/2022)   Overall Financial Resource Strain (CARDIA)    Difficulty of Paying Living Expenses: Not hard at all  Food Insecurity: No Food Insecurity (11/07/2022)   Hunger Vital Sign    Worried About Running Out of Food in the Last Year: Never true    Ran Out of Food in the Last Year: Never true  Transportation Needs: No Transportation Needs (11/07/2022)   PRAPARE - Administrator, Civil Service (Medical): No    Lack of Transportation (Non-Medical): No  Physical Activity: Not on file  Stress: Not on file  Social Connections: Not on file    Allergies:  Allergies  Allergen Reactions   Salmon [Fish Oil]    Amoxicillin Rash and Hives   Latex Rash   Percocet [Oxycodone-Acetaminophen ] Nausea And Vomiting and Rash    Metabolic Disorder Labs: Lab Results  Component Value Date   HGBA1C 5.3 03/08/2019   MPG 105.41 03/08/2019   No results found for: PROLACTIN Lab Results   Component Value Date   CHOL 148 03/08/2019   TRIG 115 03/08/2019   HDL 52 03/08/2019   CHOLHDL 2.8 03/08/2019   VLDL 23 03/08/2019   LDLCALC 73 03/08/2019   Lab Results  Component Value Date   TSH 0.989 11/18/2022   TSH 1.201 03/08/2019    Therapeutic Level Labs: No results found for: LITHIUM No results found for: VALPROATE No results found for: CBMZ  Current Medications: Current Outpatient Medications  Medication Sig Dispense Refill   albuterol  (VENTOLIN  HFA) 108 (90 Base) MCG/ACT inhaler TAKE 2 PUFFS BY MOUTH EVERY 6 HOURS AS NEEDED FOR WHEEZE OR SHORTNESS OF BREATH 3 each 1   cetirizine  (ZYRTEC  ALLERGY) 10 MG tablet Take 1 tablet (10 mg total) by mouth daily. 90 tablet 1   drospirenone -ethinyl estradiol  (YASMIN  28) 3-0.03 MG tablet Take 1 tablet by mouth daily. 84  tablet 3   escitalopram  (LEXAPRO ) 10 MG tablet Take 1 tablet (10 mg total) by mouth daily with breakfast. 90 tablet 0   fluticasone  (FLONASE ) 50 MCG/ACT nasal spray Place 2 sprays into both nostrils daily. 16 g 6   hydrOXYzine  (ATARAX ) 10 MG tablet Take 1 tablet (10 mg total) by mouth 3 (three) times daily as needed for anxiety. 270 tablet 0   mirtazapine  (REMERON ) 30 MG tablet TAKE 1 TABLET BY MOUTH EVERYDAY AT BEDTIME 90 tablet 1   promethazine -dextromethorphan (PROMETHAZINE -DM) 6.25-15 MG/5ML syrup Take 5 mLs by mouth 4 (four) times daily as needed for cough. 118 mL 0   No current facility-administered medications for this visit.     Musculoskeletal: Strength & Muscle Tone: UTA Gait & Station: Seated Patient leans: N/A  Psychiatric Specialty Exam: Review of Systems  Psychiatric/Behavioral:  Positive for dysphoric mood and sleep disturbance. The patient is nervous/anxious.     There were no vitals taken for this visit.There is no height or weight on file to calculate BMI.  General Appearance: Casual  Eye Contact:  Fair  Speech:  Normal Rate  Volume:  Normal  Mood:  Anxious and Depressed   Affect:  Congruent  Thought Process:  Goal Directed and Descriptions of Associations: Intact  Orientation:  Full (Time, Place, and Person)  Thought Content: Logical   Suicidal Thoughts:  No  Homicidal Thoughts:  No  Memory:  Immediate;   Fair Recent;   Fair Remote;   Fair  Judgement:  Fair  Insight:  Fair  Psychomotor Activity:  Normal  Concentration:  Concentration: Fair and Attention Span: Fair  Recall:  [employer] of Knowledge: Fair  Language: Fair  Akathisia:  No  Handed:  Right  AIMS (if indicated): not done  Assets:  Communication Skills Desire for Improvement Housing Social Support Talents/Skills Transportation  ADL's:  Intact  Cognition: WNL  Sleep:  Poor   Screenings: AIMS    Flowsheet Row Admission (Discharged) from 03/07/2019 in [facility]  AIMS Total Score 0   AUDIT    Flowsheet Row Admission (Discharged) from 03/07/2019 in [facility] INPATIENT BEH[facility] MEDICINE  Alcohol Use Disorder Identification Test Final Score (AUDIT) 0   GAD-7    Flowsheet Row Office Visit from 05/19/2024 in [facility] Office Visit from 12/25/2023 in [facility] at [facility] Video Visit from 06/30/2023 in [facility] Office Visit from 02/11/2023 in [facility] Office Visit from 12/30/2022 in [facility]  Total GAD-7 Score 11 0 8 13 14    PHQ2-9    Flowsheet Row Office Visit from 05/19/2024 in [facility] Office Visit from 12/25/2023 in [facility] & Sports Medicine at [facility] Video Visit from 06/30/2023 in [facility] Video Visit from 03/25/2023 in [facility] Office Visit from 02/11/2023 in [facility]  PHQ-2 Total Score 3 0 0 0 2  PHQ-9 Total Score 14 1 -- -- 13   Flowsheet Row Video Visit from 07/11/2024 in [facility] UC from 06/30/2024 in [facility] at [facility] Visit from 05/19/2024 in [facility]  C-SSRS RISK CATEGORY Moderate Risk No Risk Moderate Risk     Assessment and Plan: TAELYR JANTZ is a 33 year old African-American  female with history of depression, anxiety, bereavement was evaluated by telemedicine today.  Discussed assessment and plan as noted below.  MDD-unstable GAD-unstable Currently going through a break-up which has led to worsening mood symptoms.  We will consider addition of Abilify low dosage.  However will need to review EKG prior to doing so.  Also encouraged to find a new therapist to start CBT. Continue Lexapro  10 mg daily Continue Mirtazapine  30 mg at bedtime Encouraged to establish care with a new therapist, provided resources Continue Hydroxyzine  10 mg 3 times a day as needed Will consider initiation of Abilify low dosage once EKG is reviewed.    Bereavement-improving Currently coping better with grief although does have worsening mood symptoms due to recent break-up Will benefit from psychotherapy sessions. Encouraged to establish care with the therapist  At risk for prolonged QT syndrome-we will order EKG.  Patient to call [phone].  Follow-up Follow-up in clinic in 3 to 4 weeks or sooner if needed.  Communicated with staff to schedule this patient for a sooner appointment, she is on a priority list.   Collaboration of Care: Collaboration of Care: Referral or follow-up with counselor/therapist AEB encouraged to establish care with therapist.  Patient/Guardian was advised Release of Information must be obtained prior to any record release in order to collaborate their care with an outside provider. Patient/Guardian was advised if they  have not already done so to contact the registration department to sign all necessary forms in order for us  to release information regarding their care.   Consent: Patient/Guardian gives verbal consent for treatment and assignment of benefits for services provided during this visit. Patient/Guardian expressed understanding and agreed to proceed.   This note was generated in part or whole with voice recognition software. Voice recognition is usually quite accurate but there are transcription errors that can and very often do occur. I apologize for any typographical errors that were not detected and corrected.    Dawud Mays, MD 07/13/2024, 4:38 AM

## 2024-07-23 ENCOUNTER — Ambulatory Visit
Admission: RE | Admit: 2024-07-23 | Discharge: 2024-07-23 | Disposition: A | Discharge status: Home / Self Care | Payer: Self-pay | Source: Ambulatory Visit | Attending: Family Medicine | Admitting: Family Medicine

## 2024-07-23 VITALS — BP 113/67 | HR 106 | Temp 99.5°F | Resp 19

## 2024-07-23 DIAGNOSIS — U071 COVID-19: Secondary | ICD-10-CM

## 2024-07-23 LAB — SARS CORONAVIRUS 2 BY RT PCR: SARS Coronavirus 2 by RT PCR: POSITIVE — AB

## 2024-07-23 MED ORDER — PROMETHAZINE-DM 6.25-15 MG/5ML PO SYRP: 5.0000 mL | ORAL_SOLUTION | Freq: Four times a day (QID) | ORAL | 0 refills | Status: DC | PRN

## 2024-07-23 NOTE — ED Triage Notes (Signed)
 Sx since Thursday.  Tested positive for covid yesterday  Fever  Cough  Congestion

## 2024-07-23 NOTE — ED Provider Notes (Incomplete)
 [facility]    CSN: [id number] Arrival date & time: 07/23/24  9050      History   Chief Complaint Chief Complaint  Patient presents with   Cough    I tested positive for Covid - Entered by patient   Fever    HPI Mallory Johnston is a 33 y.o. female.   HPI  History obtained from the patient. Mallory Johnston presents for coughing on Thursday.  Tested positive for COVID yesterday afternoon. Has headache,  nasal congestion and fever. Tmax 101.6 F.  Used some cough medicine, nose drops and an inhaler for her symptoms.  No vomiting, diarrhea, sore throat, body aches or belly pain. Her mom's best friend had COVID and a church missionary from [place] was also sick.  She was around both of these people.    No history of asthma or lung disease.  No personal history of cancer.      Past Medical History:  Diagnosis Date   Adenomyosis 05/2024   Anxiety    Depression    H/O bilateral breast reduction surgery    History of left oophorectomy 2009   due to ovarian cyst   Lymphadenopathy 2009   left leg   Lymphedema of left leg    S/P tonsillectomy    Suicidal ideation 03/06/2019   Von Willebrand disease (HCC) 2009    Patient Active Problem List   Diagnosis Date Noted   At risk for prolonged QT interval syndrome 07/11/2024   Fatigue 05/19/2024   Hx of von Willebrand's disease 12/08/2022   MDD (major depressive disorder), recurrent episode, moderate (HCC) 11/18/2022   Bereavement 11/18/2022   Anxiety disorder 11/18/2022   Heavy menstrual period 10/14/2022   Migraine without aura and without status migrainosus, not intractable 01/11/2021   Swelling of limb 11/29/2019   Lymphedema 11/29/2019   Class 1 obesity due to excess calories without serious comorbidity with body mass index (BMI) of 30.0 to 30.9 in adult 02/11/2017   History of cervical dysplasia 02/11/2017   Liver lesion, right lobe 10/17/2016   PCOS (polycystic ovarian syndrome) 09/20/2015    Past Surgical History:   Procedure Laterality Date   BREAST REDUCTION SURGERY  2004   LOWER EXTREMITY VENOGRAPHY Left 09/24/2020   Procedure: LOWER EXTREMITY VENOGRAPHY;  Surgeon: Marea Selinda RAMAN, MD;  Location: [facility] INVASIVE CV LAB;  Service: Cardiovascular;  Laterality: Left;   OVARIAN CYST REMOVAL     REDUCTION MAMMAPLASTY     TONSILLECTOMY      OB History     Gravida  2   Para      Term      Preterm      AB  2   Living         SAB  2   IAB      Ectopic      Multiple      Live Births               Home Medications    Prior to Admission medications   Medication Sig Start Date End Date Taking? Authorizing Provider  escitalopram  (LEXAPRO ) 10 MG tablet Take 1 tablet (10 mg total) by mouth daily with breakfast. 05/19/24  Yes Eappen, Saramma, MD  etonogestrel (NEXPLANON) 68 MG IMPL implant 1 device [facility] 06/09/24  Yes [provider]  mirtazapine  (REMERON ) 30 MG tablet TAKE 1 TABLET BY MOUTH EVERYDAY AT BEDTIME 04/18/24  Yes Okey Barnie SAUNDERS, MD  albuterol  (VENTOLIN  HFA)  108 (90 Base) MCG/ACT inhaler TAKE 2 PUFFS BY MOUTH EVERY 6 HOURS AS NEEDED FOR WHEEZE OR SHORTNESS OF BREATH 02/29/24   Justus Leita DEL, MD  cetirizine  (ZYRTEC  ALLERGY) 10 MG tablet Take 1 tablet (10 mg total) by mouth daily. 11/02/22   Lavell Bari LABOR, FNP  drospirenone -ethinyl estradiol  (YASMIN  28) 3-0.03 MG tablet Take 1 tablet by mouth daily. 03/25/23   Connell Davies, MD  fluticasone  (FLONASE ) 50 MCG/ACT nasal spray Place 2 sprays into both nostrils daily. 11/02/22   Lavell Bari LABOR, FNP  hydrOXYzine  (ATARAX ) 10 MG tablet Take 1 tablet (10 mg total) by mouth 3 (three) times daily as needed for anxiety. 09/10/23   Eappen, Saramma, MD  promethazine -dextromethorphan (PROMETHAZINE -DM) 6.25-15 MG/5ML syrup Take 5 mLs by mouth 4 (four) times daily as needed for cough. 07/23/24   Gleb Mcguire, DO    Family History Family History  Problem Relation Age of Onset   Depression Mother     Diabetes Mother    Hypertension Father    Breast cancer Neg Hx    Ovarian cancer Neg Hx    Colon cancer Neg Hx     Social History Social History   Tobacco Use   Smoking status: Never   Smokeless tobacco: Never  Vaping Use   Vaping status: Never Used  Substance Use Topics   Alcohol use: Not Currently    Comment: rare   Drug use: No     Allergies   Salmon [fish oil], Amoxicillin, Latex, and Percocet [oxycodone-acetaminophen ]   Review of Systems Review of Systems: negative unless otherwise stated in HPI.      Physical Exam Triage Vital Signs ED Triage Vitals  Encounter Vitals Group     BP 07/23/24 1000 113/67     Girls Systolic BP Percentile --      Girls Diastolic BP Percentile --      Boys Systolic BP Percentile --      Boys Diastolic BP Percentile --      Pulse Rate 07/23/24 1000 (!) 106     Resp 07/23/24 1000 19     Temp 07/23/24 1000 99.5 F (37.5 C)     Temp Source 07/23/24 1000 Oral     SpO2 07/23/24 1000 98 %     Weight --      Height --      Head Circumference --      Peak Flow --      Pain Score 07/23/24 0959 0     Pain Loc --      Pain Education --      Exclude from Growth Chart --    No data found.  Updated Vital Signs BP 113/67 (BP Location: Left Arm)   Pulse (!) 106   Temp 99.5 F (37.5 C) (Oral)   Resp 19   SpO2 98%   Visual Acuity Right Eye Distance:   Left Eye Distance:   Bilateral Distance:    Right Eye Near:   Left Eye Near:    Bilateral Near:     Physical Exam GEN:     alert, non-toxic appearing female in no distress    HENT:  mucus membranes moist, oropharyngeal without lesions or erythema, no tonsillar hypertrophy or exudates, clear nasal discharge EYES:   no scleral injection or discharge NECK:  normal ROM,no meningismus   RESP:  no increased work of breathing, clear to auscultation bilaterally CVS:   regular rhythm, tachycardic  Skin:   warm and dry  UC Treatments / Results  Labs (all labs ordered are  listed, but only abnormal results are displayed) Labs Reviewed  SARS CORONAVIRUS 2 BY RT PCR - Abnormal; Notable for the following components:      Result Value   SARS Coronavirus 2 by RT PCR POSITIVE (*)    All other components within normal limits    EKG   Radiology No results found.  Procedures Procedures (including critical care time)  Medications Ordered in UC Medications - No data to display  Initial Impression / Assessment and Plan / UC Course  I have reviewed the triage vital signs and the nursing notes.  Pertinent labs & imaging results that were available during my care of the patient were reviewed by me and considered in my medical decision making (see chart for details).        Patient is a 33 y.o. female with history of von Willebrand's disease, PCOS, MDD, obesity who presents after testing positive for at home for COVID .   Halah has had COVID before.  She is tachycardic with an elevated temperature here of 99.5 F but is normotensive and satting well on room air.  Overall she is non-toxic appearing, well-hydrated and in no respiratory distress. COVID PCR testing obtained and she was positive for COVID.    Discussed symptomatic treatment. Lee's cough bothers her the most. Treat with Promethazine  DM for cough. Pulmonary exam she has equal aeration bilaterally, imaging deferred. Khadija is not interested in treatment for COVID. After shared decision making, Paxlovid is deferred.  Work note provided. Typical duration of symptoms discussed. ED and return precautions and understanding voiced. Discussed MDM, treatment plan and plan for follow-up with patient who agrees with plan.      Final Clinical Impressions(s) / UC Diagnoses   Final diagnoses:  COVID-19     Discharge Instructions      Your test for COVID-19 was positive, meaning that you were infected with the novel coronavirus and could give the germ to others.  The recommendations suggest returning to  normal activities when, for at least 24 hours, symptoms are improving overall, and if a fever was present, it has been gone without use of a fever-reducing medication.  You should wear a mask for the next 5 days to prevent the spread of disease. Please continue good preventive care measures, including:  frequent hand-washing, avoid touching your face, cover coughs/sneezes, stay out of crowds and keep a 6 foot distance from others.  Go to the nearest hospital emergency room if fever/cough/breathlessness are severe or illness seems like a threat to life.  If your were prescribed medication. Stop by the pharmacy to pick it up. You can take Tylenol  and/or Ibuprofen as needed for fever reduction and pain relief.    For cough: honey 1/2 to 1 teaspoon (you can dilute the honey in water or another fluid).  You can also use guaifenesin and dextromethorphan for cough. You can use a humidifier for chest congestion and cough.  If you don't have a humidifier, you can sit in the bathroom with the hot shower running.      For sore throat: try warm salt water gargles, Mucinex sore throat cough drops or cepacol lozenges, throat spray, warm tea or water with lemon/honey, popsicles or ice, or OTC cold relief medicine for throat discomfort. You can also purchase chloraseptic spray at the pharmacy or dollar store.   For congestion: take a daily anti-histamine like Zyrtec , Claritin, and a oral decongestant, such as  pseudoephedrine.  You can also use Flonase  1-2 sprays in each nostril daily. Afrin is also a good option, if you do not have high blood pressure.    It is important to stay hydrated: drink plenty of fluids (water, gatorade/powerade/pedialyte, juices, or teas) to keep your throat moisturized and help further relieve irritation/discomfort.    Return or go to the Emergency Department if symptoms worsen or do not improve in the next few days      ED Prescriptions     Medication Sig Dispense Auth. Provider    promethazine -dextromethorphan (PROMETHAZINE -DM) 6.25-15 MG/5ML syrup Take 5 mLs by mouth 4 (four) times daily as needed for cough. 118 mL Pope Brunty, DO      PDMP not reviewed this encounter.       Corie Allis, DO 07/26/24 [phone]

## 2024-07-23 NOTE — Discharge Instructions (Addendum)

## 2024-08-12 ENCOUNTER — Ambulatory Visit
Admission: RE | Admit: 2024-08-12 | Discharge: 2024-08-12 | Disposition: A | Discharge status: Home / Self Care | Source: Ambulatory Visit | Attending: Psychiatry | Admitting: Psychiatry

## 2024-08-12 DIAGNOSIS — F331 Major depressive disorder, recurrent, moderate: Secondary | ICD-10-CM | POA: Diagnosis not present

## 2024-08-12 DIAGNOSIS — Z87898 Personal history of other specified conditions: Secondary | ICD-10-CM | POA: Diagnosis not present

## 2024-08-12 DIAGNOSIS — F411 Generalized anxiety disorder: Secondary | ICD-10-CM | POA: Diagnosis not present

## 2024-08-16 ENCOUNTER — Encounter: Payer: Self-pay | Admitting: Internal Medicine

## 2024-08-16 ENCOUNTER — Ambulatory Visit: Admitting: Internal Medicine

## 2024-08-16 VITALS — BP 128/78 | HR 103 | Ht 61.0 in | Wt 213.0 lb

## 2024-08-16 DIAGNOSIS — W57XXXD Bitten or stung by nonvenomous insect and other nonvenomous arthropods, subsequent encounter: Secondary | ICD-10-CM

## 2024-08-16 DIAGNOSIS — S30860D Insect bite (nonvenomous) of lower back and pelvis, subsequent encounter: Secondary | ICD-10-CM | POA: Diagnosis not present

## 2024-08-16 DIAGNOSIS — R9431 Abnormal electrocardiogram [ECG] [EKG]: Secondary | ICD-10-CM | POA: Diagnosis not present

## 2024-08-16 NOTE — Progress Notes (Signed)
 Date:  08/16/2024   Name:  Mallory Johnston   DOB:  09/27/1991   MRN:  [id number]   Chief Complaint: Tick Removal (Patient removed a tick off of her back on 06/25/2024. Patient seen Urgent Care in [place]. No prescriptions were given. Patient is having body aches, joint pain, and is concerned this came from the tick bite. No rash. No fevers. )  She was seen 06/27/24 for a tick that had likely been attached for a week or more.  It was removed, the site appeared benign.  They did not advise testing or antibiotics. Several weeks later she tested positive for Covid.  She felt tired but had minimal symptoms.  She did not some muscle aches and mild joint pain without redness or swelling.  These have mostly resolved but she is concerned about Lyme disease.  She never developed a diffuse rash or target lesion. Her psychiatrist wanted to start her on Abilify so had an outpatient EKG done.  This was reported as abnormal. Normal sinus rhythm Possible Lateral infarct , age undetermined Inferior infarct , age undetermined ST & T wave abnormality, consider anterior ischemia Abnormal ECG When compared with ECG of 19-May-2014 01:59, Significant changes have occurred Confirmed by Fernand Alter [phone]) on 08/15/2024 1:11:28 PM  Review of Systems  Constitutional:  Negative for appetite change, fatigue, fever and unexpected weight change.  Respiratory:  Positive for shortness of breath. Negative for chest tightness and wheezing.   Cardiovascular:  Positive for palpitations. Negative for chest pain and leg swelling.  Musculoskeletal:  Negative for arthralgias, gait problem and joint swelling.  Neurological:  Negative for dizziness and headaches.     Lab Results  Component Value Date   NA 139 03/05/2019   K 3.4 (L) 03/05/2019   CO2 25 03/05/2019   GLUCOSE 96 03/05/2019   BUN 9 09/24/2020   CREATININE 0.74 09/24/2020   CALCIUM 9.2 03/05/2019   GFRNONAA >60 09/24/2020   Lab Results  Component Value  Date   CHOL 148 03/08/2019   HDL 52 03/08/2019   LDLCALC 73 03/08/2019   TRIG 115 03/08/2019   CHOLHDL 2.8 03/08/2019   Lab Results  Component Value Date   TSH 0.989 11/18/2022   Lab Results  Component Value Date   HGBA1C 5.3 03/08/2019   Lab Results  Component Value Date   WBC 11.0 (H) 10/14/2022   HGB 12.2 10/14/2022   HCT 36.6 10/14/2022   MCV 85.5 10/14/2022   PLT 306 10/14/2022   Lab Results  Component Value Date   ALT 15 03/05/2019   AST 21 03/05/2019   ALKPHOS 65 03/05/2019   BILITOT 0.5 03/05/2019   No results found for: MARIEN BOLLS, VD25OH   Patient Active Problem List   Diagnosis Date Noted   At risk for prolonged QT interval syndrome 07/11/2024   Fatigue 05/19/2024   Hx of von Willebrand's disease 12/08/2022   MDD (major depressive disorder), recurrent episode, moderate (HCC) 11/18/2022   Bereavement 11/18/2022   Anxiety disorder 11/18/2022   Heavy menstrual period 10/14/2022   Migraine without aura and without status migrainosus, not intractable 01/11/2021   Swelling of limb 11/29/2019   Lymphedema 11/29/2019   Class 1 obesity due to excess calories without serious comorbidity with body mass index (BMI) of 30.0 to 30.9 in adult 02/11/2017   History of cervical dysplasia 02/11/2017   Liver lesion, right lobe 10/17/2016   PCOS (polycystic ovarian syndrome) 09/20/2015    Allergies  Allergen Reactions  Salmon [Fish Oil]    Amoxicillin Rash and Hives   Latex Rash   Percocet [Oxycodone-Acetaminophen ] Nausea And Vomiting and Rash    Past Surgical History:  Procedure Laterality Date   BREAST REDUCTION SURGERY  2004   LOWER EXTREMITY VENOGRAPHY Left 09/24/2020   Procedure: LOWER EXTREMITY VENOGRAPHY;  Surgeon: Marea Selinda RAMAN, MD;  Location: [facility] INVASIVE CV LAB;  Service: Cardiovascular;  Laterality: Left;   OVARIAN CYST REMOVAL     REDUCTION MAMMAPLASTY     TONSILLECTOMY      Social History   Tobacco Use   Smoking status: Never    Smokeless tobacco: Never  Vaping Use   Vaping status: Never Used  Substance Use Topics   Alcohol use: Not Currently    Comment: rare   Drug use: No     Medication list has been reviewed and updated.  Current Meds  Medication Sig   albuterol  (VENTOLIN  HFA) 108 (90 Base) MCG/ACT inhaler TAKE 2 PUFFS BY MOUTH EVERY 6 HOURS AS NEEDED FOR WHEEZE OR SHORTNESS OF BREATH   cetirizine  (ZYRTEC  ALLERGY) 10 MG tablet Take 1 tablet (10 mg total) by mouth daily.   drospirenone -ethinyl estradiol  (YASMIN  28) 3-0.03 MG tablet Take 1 tablet by mouth daily.   escitalopram  (LEXAPRO ) 10 MG tablet Take 1 tablet (10 mg total) by mouth daily with breakfast.   etonogestrel (NEXPLANON) 68 MG IMPL implant 1 device [facility]   fluticasone  (FLONASE ) 50 MCG/ACT nasal spray Place 2 sprays into both nostrils daily.   hydrOXYzine  (ATARAX ) 10 MG tablet Take 1 tablet (10 mg total) by mouth 3 (three) times daily as needed for anxiety.   mirtazapine  (REMERON ) 30 MG tablet TAKE 1 TABLET BY MOUTH EVERYDAY AT BEDTIME   promethazine -dextromethorphan (PROMETHAZINE -DM) 6.25-15 MG/5ML syrup Take 5 mLs by mouth 4 (four) times daily as needed for cough.       08/16/2024    4:12 PM 05/19/2024   10:23 AM 12/25/2023    4:05 PM 06/30/2023   10:07 AM  GAD 7 : Generalized Anxiety Score  Nervous, Anxious, on Edge 1  0   Control/stop worrying 1  0   Worry too much - different things 2  0   Trouble relaxing 2  0   Restless 1  0   Easily annoyed or irritable 3  0   Afraid - awful might happen 2  0   Total GAD 7 Score 12  0   Anxiety Difficulty Somewhat difficult  Not difficult at all      Information is confidential and restricted. Go to Review Flowsheets to unlock data.       08/16/2024    4:12 PM 05/19/2024   10:22 AM 12/25/2023    4:05 PM  Depression screen PHQ 2/9  Decreased Interest 2  0  Down, Depressed, Hopeless 2  0  PHQ - 2 Score 4  0  Altered sleeping 2  0  Tired, decreased energy 3   1  Change in appetite 3  0  Feeling bad or failure about yourself  2  0  Trouble concentrating 1  0  Moving slowly or fidgety/restless 0  0  Suicidal thoughts 0  0  PHQ-9 Score 15  1  Difficult doing work/chores Somewhat difficult  Not difficult at all     Information is confidential and restricted. Go to Review Flowsheets to unlock data.    BP Readings from Last 3 Encounters:  08/16/24 128/78  07/23/24 113/67  06/30/24 114/79    Physical Exam Vitals and nursing note reviewed.  Constitutional:      General: She is not in acute distress.    Appearance: She is well-developed. She is obese.  HENT:     Head: Normocephalic and atraumatic.  Cardiovascular:     Rate and Rhythm: Normal rate and regular rhythm.     Heart sounds: No murmur heard. Pulmonary:     Effort: Pulmonary effort is normal. No respiratory distress.     Breath sounds: No wheezing or rhonchi.  Musculoskeletal:        General: No swelling, tenderness or deformity.     Cervical back: Normal range of motion.     Right lower leg: No edema.     Left lower leg: No edema.  Lymphadenopathy:     Cervical: No cervical adenopathy.  Skin:    General: Skin is warm and dry.     Findings: No rash.      Neurological:     Mental Status: She is alert and oriented to person, place, and time.  Psychiatric:        Mood and Affect: Mood normal.        Behavior: Behavior normal.     Wt Readings from Last 3 Encounters:  08/16/24 213 lb (96.6 kg)  06/30/24 215 lb (97.5 kg)  06/01/24 219 lb 3.2 oz (99.4 kg)    BP 128/78   Pulse (!) 103   Ht 5' 1 (1.549 m)   Wt 213 lb (96.6 kg)   SpO2 98%   BMI 40.25 kg/m   Assessment and Plan:  Problem List Items Addressed This Visit   None Visit Diagnoses       Tick bite of lower back, subsequent encounter    -  Primary   transient myalgia/joint pains likely sequelae of Covid with an abundance of caution will get Lyme serologies hold off on antibiotics until serologies  return   Relevant Orders   Lyme Disease Serology w/Reflex     Abnormal electrocardiogram (ECG) (EKG)       refer to cardiology   Relevant Orders   Ambulatory referral to Cardiology       No follow-ups on file.    Leita HILARIO Adie, MD [facility] [place]

## 2024-08-17 ENCOUNTER — Ambulatory Visit: Payer: Self-pay | Admitting: Internal Medicine

## 2024-08-17 LAB — LYME DISEASE SEROLOGY W/REFLEX: Lyme Total Antibody EIA: NEGATIVE

## 2024-08-18 ENCOUNTER — Telehealth: Payer: Self-pay | Admitting: Psychiatry

## 2024-08-18 ENCOUNTER — Ambulatory Visit: Payer: Self-pay | Admitting: Psychiatry

## 2024-08-18 NOTE — Telephone Encounter (Signed)
 Contacted patient to discuss EKG which was resulted on August 12, 2024-normal sinus rhythm, possible lateral infarct, inferior infarct, abnormal EKG when compared to May 19, 2014 significant changes. As per patient she has been referred to cardiology by primary care provider. Will await cardiology clearance prior to making any further changes with her psychotropics.

## 2024-08-23 NOTE — Progress Notes (Unsigned)
 Cardiology Office Note    Date:  08/24/2024   ID:  Mallory Johnston, DOB [birth year], MRN [id number]  PCP:  Justus Leita DEL, MD  Cardiologist:  None  Electrophysiologist:  None   Chief Complaint: Abnormal EKG  History of Present Illness:   Mallory Johnston is a 33 y.o. female with history of possible von Willebrand disease diagnosed in [place]  in 2009 with normal workup in 2023, PCOS with menometrorrhagia, major depressive disorder, anxiety, obesity, and OSA who presents for evaluation of abnormal EKG.  She underwent EKG as recommended by behavioral health prior to initiating Abilify on 08/12/2024, that showed significant changes when compared to prior.  In this setting she was referred to cardiology for further evaluation.  She comes in today and is accompanied by her parents via FaceTime, who are in [place] .  She is without symptoms of angina or cardiac decompensation.  She reports a longstanding history of some exertional shortness of breath without frank chest pain.  No dizziness, presyncope, or syncope.  No family history of premature CAD or sudden cardiac death.  She does not use tobacco, drink alcohol, participate with illicit substances, or drink energy drinks.  Her mother does question if the patient has some underlying undiagnosed asthma as well based on breathing and family history.  EKG in the office today is significantly different from tracing obtained earlier this month and consistent with a tracing from 2015 demonstrating sinus rhythm with no significant ST-T changes.   Labs independently reviewed: 04/2024 - Hgb 11.6, PLT 359, A1c 5.8, TSH normal   Past Medical History:  Diagnosis Date   Adenomyosis 05/2024   Anxiety    Depression    H/O bilateral breast reduction surgery    History of left oophorectomy 2009   due to ovarian cyst   Lymphadenopathy 2009   left leg   Lymphedema of left leg    S/P tonsillectomy    Suicidal ideation 03/06/2019   Von Willebrand  disease (HCC) 2009    Past Surgical History:  Procedure Laterality Date   BREAST REDUCTION SURGERY  2004   LOWER EXTREMITY VENOGRAPHY Left 09/24/2020   Procedure: LOWER EXTREMITY VENOGRAPHY;  Surgeon: Marea Selinda RAMAN, MD;  Location: [facility] INVASIVE [facility];  Service: Cardiovascular;  Laterality: Left;   OVARIAN CYST REMOVAL     REDUCTION MAMMAPLASTY     TONSILLECTOMY      Current Medications: Current Meds  Medication Sig   albuterol  (VENTOLIN  HFA) 108 (90 Base) MCG/ACT inhaler TAKE 2 PUFFS BY MOUTH EVERY 6 HOURS AS NEEDED FOR WHEEZE OR SHORTNESS OF BREATH   cetirizine  (ZYRTEC  ALLERGY) 10 MG tablet Take 1 tablet (10 mg total) by mouth daily.   escitalopram  (LEXAPRO ) 10 MG tablet Take 1 tablet (10 mg total) by mouth daily with breakfast.   etonogestrel (NEXPLANON) 68 MG IMPL implant 1 device provided by [facility]   fluticasone  (FLONASE ) 50 MCG/ACT nasal spray Place 2 sprays into both nostrils daily.   hydrOXYzine  (ATARAX ) 10 MG tablet Take 1 tablet (10 mg total) by mouth 3 (three) times daily as needed for anxiety.   mirtazapine  (REMERON ) 30 MG tablet TAKE 1 TABLET BY MOUTH EVERYDAY AT BEDTIME   promethazine -dextromethorphan (PROMETHAZINE -DM) 6.25-15 MG/5ML syrup Take 5 mLs by mouth 4 (four) times daily as needed for cough.    Allergies:   Salmon [fish oil], Amoxicillin, Latex, and Percocet [oxycodone-acetaminophen ]   Social History   Socioeconomic History   Marital status: Single  Spouse name: Not on file   Number of children: Not on file   Years of education: Not on file   Highest education level: Some college, no degree  Occupational History   Not on file  Tobacco Use   Smoking status: Never   Smokeless tobacco: Never  Vaping Use   Vaping status: Never Used  Substance and Sexual Activity   Alcohol use: Not Currently    Comment: rare   Drug use: No   Sexual activity: Not Currently    Birth control/protection: Pill  Other Topics Concern   Not on file   Social History Narrative   Not on file   Social Drivers of Health   Financial Resource Strain: Low Risk  (11/07/2022)   Overall Financial Resource Strain (CARDIA)    Difficulty of Paying Living Expenses: Not hard at all  Food Insecurity: No Food Insecurity (11/07/2022)   Hunger Vital Sign    Worried About Running Out of Food in the Last Year: Never true    Ran Out of Food in the Last Year: Never true  Transportation Needs: No Transportation Needs (11/07/2022)   PRAPARE - Administrator, Civil Service (Medical): No    Lack of Transportation (Non-Medical): No  Physical Activity: Not on file  Stress: Not on file  Social Connections: Not on file     Family History:  The patient's family history includes Depression in her mother; Diabetes in her mother; Heart attack (age of onset: 60) in her father; Heart disease (age of onset: 57) in her father; Hyperlipidemia in her father; Hypertension in her father. There is no history of Breast cancer, Ovarian cancer, or Colon cancer.  ROS:   12-point review of systems is negative unless otherwise noted in the HPI.   EKGs/Labs/Other Studies Reviewed:    Studies reviewed were summarized above. The additional studies were reviewed today: None available for review.   EKG:  EKG is ordered today.  The EKG ordered today demonstrates NSR, 91 bpm, no acute ST-T changes, QT 362  Recent Labs: No results found for requested labs within last 365 days.  Recent Lipid Panel    Component Value Date/Time   CHOL 148 03/08/2019 0648   TRIG 115 03/08/2019 0648   HDL 52 03/08/2019 0648   CHOLHDL 2.8 03/08/2019 0648   VLDL 23 03/08/2019 0648   LDLCALC 73 03/08/2019 0648    PHYSICAL EXAM:    VS:  BP 120/76 (BP Location: Right Arm, Patient Position: Sitting, Cuff Size: Large)   Pulse 91   Ht 5' 1 (1.549 m)   Wt 215 lb (97.5 kg)   SpO2 98%   BMI 40.62 kg/m   BMI: Body mass index is 40.62 kg/m.  Physical Exam Vitals reviewed.   Constitutional:      Appearance: She is well-developed.  HENT:     Head: Normocephalic and atraumatic.  Eyes:     General:        Right eye: No discharge.        Left eye: No discharge.  Cardiovascular:     Rate and Rhythm: Normal rate and regular rhythm.     Heart sounds: Normal heart sounds, S1 normal and S2 normal. Heart sounds not distant. No midsystolic click and no opening snap. No murmur heard.    No friction rub.  Pulmonary:     Effort: Pulmonary effort is normal. No respiratory distress.     Breath sounds: Normal breath sounds. No decreased breath sounds, wheezing,  rhonchi or rales.  Musculoskeletal:     Cervical back: Normal range of motion.  Skin:    General: Skin is warm and dry.     Nails: There is no clubbing.  Neurological:     Mental Status: She is alert and oriented to person, place, and time.  Psychiatric:        Speech: Speech normal.        Behavior: Behavior normal.        Thought Content: Thought content normal.        Judgment: Judgment normal.     Wt Readings from Last 3 Encounters:  08/24/24 215 lb (97.5 kg)  08/16/24 213 lb (96.6 kg)  06/30/24 215 lb (97.5 kg)     ASSESSMENT & PLAN:   Abnormal EKG/dyspnea: Patient recently underwent EKG to establish baseline prior to initiating Abilify.  EKG at that time showed significant abnormalities when compared to prior tracing raising the question of possible lead placement versus accurate patient data.  This EKG showed diffuse T wave inversion with retrograde P waves that were not consistent with accurate tracing.  Repeat EKG today shows sinus rhythm with no acute ST-T changes, normal QT and is consistent with tracing from 2015.  We will obtain an echo to evaluate for significant structural abnormality given the question of possible prior abnormal EKG and in the context of underlying dyspnea.  If this is without significant structural abnormality, I do not see contraindication to behavioral health moving  forward with pharmacotherapy.     Disposition: F/u with MD in 2 months.  Medication Adjustments/Labs and Tests Ordered: Current medicines are reviewed at length with the patient today.  Concerns regarding medicines are outlined above. Medication changes, Labs and Tests ordered today are summarized above and listed in the Patient Instructions accessible in Encounters.   Signed, Bernardino Bring, PA-C 08/24/2024 4:52 PM     [facility] [place] [address] [place], [place] [address] [phone]

## 2024-08-24 ENCOUNTER — Ambulatory Visit: Attending: Physician Assistant | Admitting: Physician Assistant

## 2024-08-24 ENCOUNTER — Encounter: Payer: Self-pay | Admitting: Physician Assistant

## 2024-08-24 VITALS — BP 120/76 | HR 91 | Ht 61.0 in | Wt 215.0 lb

## 2024-08-24 DIAGNOSIS — R9431 Abnormal electrocardiogram [ECG] [EKG]: Secondary | ICD-10-CM | POA: Diagnosis not present

## 2024-08-24 DIAGNOSIS — R0602 Shortness of breath: Secondary | ICD-10-CM

## 2024-08-24 NOTE — Patient Instructions (Signed)
 Medication Instructions:  Your physician recommends that you continue on your current medications as directed. Please refer to the Current Medication list given to you today.   *If you need a refill on your cardiac medications before your next appointment, please call your pharmacy*  Lab Work: None ordered at this time   Testing/Procedures: Your physician has requested that you have an echocardiogram. Echocardiography is a painless test that uses sound waves to create images of your heart. It provides your doctor with information about the size and shape of your heart and how well your heart's chambers and valves are working.   You may receive an ultrasound enhancing agent through an IV if needed to better visualize your heart during the echo. This procedure takes approximately one hour.  There are no restrictions for this procedure.  This will take place at [address] [facility]) [address], [place] [address]  Please note: We ask at that you not bring children with you during ultrasound (echo/ vascular) testing. Due to room size and safety concerns, children are not allowed in the ultrasound rooms during exams. Our front office staff cannot provide observation of children in our lobby area while testing is being conducted. An adult accompanying a patient to their appointment will only be allowed in the ultrasound room at the discretion of the ultrasound technician under special circumstances. We apologize for any inconvenience.  Follow-Up: At [facility], you and your health needs are our priority.  As part of our continuing mission to provide you with exceptional heart care, our providers are all part of one team.  This team includes your primary Cardiologist (physician) and Advanced Practice Providers or APPs (Physician Assistants and Nurse Practitioners) who all work together to provide you with the care you need, when you need it.  Your next appointment:   2  month(s)  Provider:   Caron Poser, MD

## 2024-09-12 ENCOUNTER — Telehealth: Admitting: Psychiatry

## 2024-09-12 ENCOUNTER — Encounter: Payer: Self-pay | Admitting: Psychiatry

## 2024-09-12 DIAGNOSIS — Z634 Disappearance and death of family member: Secondary | ICD-10-CM

## 2024-09-12 DIAGNOSIS — F411 Generalized anxiety disorder: Secondary | ICD-10-CM

## 2024-09-12 DIAGNOSIS — F331 Major depressive disorder, recurrent, moderate: Secondary | ICD-10-CM | POA: Diagnosis not present

## 2024-09-12 MED ORDER — ESCITALOPRAM OXALATE 10 MG PO TABS: 10.0000 mg | ORAL_TABLET | Freq: Every day | ORAL | 1 refills | Status: AC

## 2024-09-12 NOTE — Progress Notes (Unsigned)
 Virtual Visit via Video Note  I connected with Mallory Johnston on 09/12/24 at  3:20 PM EDT by a video enabled telemedicine application and verified that I am speaking with the correct person using two identifiers.  Location Provider Location : [facility] Patient Location : Work  Participants: Patient , Provider    I discussed the limitations of evaluation and management by telemedicine and the availability of in person appointments. The patient expressed understanding and agreed to proceed.   I discussed the assessment and treatment plan with the patient. The patient was provided an opportunity to ask questions and all were answered. The patient agreed with the plan and demonstrated an understanding of the instructions.   The patient was advised to call back or seek an in-person evaluation if the symptoms worsen or if the condition fails to improve as anticipated.   BH MD OP Progress Note  09/12/2024 3:37 PM VAYLA WILHELMI  MRN:  [id number]  Chief Complaint:  Chief Complaint  Patient presents with   Follow-up   Anxiety   Depression   Insomnia   Medication Refill    Discussed the use of AI scribe software for clinical note transcription with the patient, who gave verbal consent to proceed.  History of Present Illness Mallory Johnston is a 33 year old African-American female, employed, lives in [place], has a history of MDD, bereavement, GAD, von Willebrand disease, PCOS was evaluated by telemedicine today.  She continues to experience ongoing severe anxiety, which she describes as triggered by work-related stress and interpersonal conflicts. She states that she becomes very angry quickly and works on controlling her anger, noting that sometimes she needs to walk away from situations at work to maintain calm. She describes improvement in her symptoms after distancing herself from a person who contributed to her distress.   She reports that her sleep patterns remain variable, with  some nights of good sleep and other nights where she wakes up every 3 hours or has difficulty staying asleep. She has started reading at night to help with sleep onset, which she finds helpful for falling asleep but not for maintaining sleep. She estimates that she has 2 nights per week of good sleep, typically when she feels very exhausted, and more disrupted sleep on days with higher work-related stress and anxiety.  She continues to feel grief from the death of her grandmother, and she describes having good and bad days. She reports missing her loved one and sometimes copes by talking about her or cuddling with her blanket, though she notes that it no longer smells the same. She feels she is doing better overall in coping with her grief.  She reports currently taking Lexapro  and mirtazapine .  She also describes efforts to establish care with a new therapist, expressing a preference for individual therapy over group therapy and noting difficulty finding a provider who is a good fit and accepts her insurance.  She also describes recent medical evaluations for cardiac symptoms, including a corrected EKG and an upcoming echocardiogram.  Most recent EKG dated 08/24/2024-normal sinus rhythm, QTc 445.  She denies any current suicidality, homicidality or perceptual disturbances.   Visit Diagnosis:    ICD-10-CM   1. MDD (major depressive disorder), recurrent episode, moderate (HCC)  F33.1     2. Bereavement  Z63.4     3. Generalized anxiety disorder  F41.1 escitalopram  (LEXAPRO ) 10 MG tablet      Past Psychiatric History: I have reviewed past psychiatric history from progress note on  11/18/2022.  Past trials of medications like trazodone , zolpidem.  History of suicide attempt in March 2020 involving overdosing on pills.  Past Medical History:  Past Medical History:  Diagnosis Date   Adenomyosis 05/2024   Anxiety    Depression    H/O bilateral breast reduction surgery    History of left  oophorectomy 2009   due to ovarian cyst   Lymphadenopathy 2009   left leg   Lymphedema of left leg    S/P tonsillectomy    Suicidal ideation 03/06/2019   Von Willebrand disease (HCC) 2009    Past Surgical History:  Procedure Laterality Date   BREAST REDUCTION SURGERY  2004   LOWER EXTREMITY VENOGRAPHY Left 09/24/2020   Procedure: LOWER EXTREMITY VENOGRAPHY;  Surgeon: Marea Selinda RAMAN, MD;  Location: [facility] INVASIVE CV LAB;  Service: Cardiovascular;  Laterality: Left;   OVARIAN CYST REMOVAL     REDUCTION MAMMAPLASTY     TONSILLECTOMY      Family Psychiatric History: Reviewed family psychiatric history from progress note on 11/18/2022.  Family History:  Family History  Problem Relation Age of Onset   Depression Mother    Diabetes Mother    Heart attack Father 53       stent placement   Hyperlipidemia Father    Heart disease Father 25   Hypertension Father    Breast cancer Neg Hx    Ovarian cancer Neg Hx    Colon cancer Neg Hx     Social History: I have reviewed social history from progress note on 11/18/2022. Social History   Socioeconomic History   Marital status: Single    Spouse name: Not on file   Number of children: Not on file   Years of education: Not on file   Highest education level: Some college, no degree  Occupational History   Not on file  Tobacco Use   Smoking status: Never   Smokeless tobacco: Never  Vaping Use   Vaping status: Never Used  Substance and Sexual Activity   Alcohol use: Not Currently    Comment: rare   Drug use: No   Sexual activity: Not Currently    Birth control/protection: Pill  Other Topics Concern   Not on file  Social History Narrative   Not on file   Social Drivers of Health   Financial Resource Strain: Low Risk  (11/07/2022)   Overall Financial Resource Strain (CARDIA)    Difficulty of Paying Living Expenses: Not hard at all  Food Insecurity: No Food Insecurity (11/07/2022)   Hunger Vital Sign    Worried About Running  Out of Food in the Last Year: Never true    Ran Out of Food in the Last Year: Never true  Transportation Needs: No Transportation Needs (11/07/2022)   PRAPARE - Administrator, Civil Service (Medical): No    Lack of Transportation (Non-Medical): No  Physical Activity: Not on file  Stress: Not on file  Social Connections: Not on file    Allergies:  Allergies  Allergen Reactions   Salmon [Fish Oil]    Amoxicillin Rash and Hives   Latex Rash   Percocet [Oxycodone-Acetaminophen ] Nausea And Vomiting and Rash    Metabolic Disorder Labs: Lab Results  Component Value Date   HGBA1C 5.3 03/08/2019   MPG 105.41 03/08/2019   No results found for: PROLACTIN Lab Results  Component Value Date   CHOL 148 03/08/2019   TRIG 115 03/08/2019   HDL 52 03/08/2019   CHOLHDL 2.8  03/08/2019   VLDL 23 03/08/2019   LDLCALC 73 03/08/2019   Lab Results  Component Value Date   TSH 0.989 11/18/2022   TSH 1.201 03/08/2019    Therapeutic Level Labs: No results found for: LITHIUM No results found for: VALPROATE No results found for: CBMZ  Current Medications: Current Outpatient Medications  Medication Sig Dispense Refill   albuterol  (VENTOLIN  HFA) 108 (90 Base) MCG/ACT inhaler TAKE 2 PUFFS BY MOUTH EVERY 6 HOURS AS NEEDED FOR WHEEZE OR SHORTNESS OF BREATH 3 each 1   cetirizine  (ZYRTEC  ALLERGY) 10 MG tablet Take 1 tablet (10 mg total) by mouth daily. 90 tablet 1   escitalopram  (LEXAPRO ) 10 MG tablet Take 1 tablet (10 mg total) by mouth daily with breakfast. 90 tablet 1   etonogestrel (NEXPLANON) 68 MG IMPL implant 1 [facility]     fluticasone  (FLONASE ) 50 MCG/ACT nasal spray Place 2 sprays into both nostrils daily. 16 g 6   hydrOXYzine  (ATARAX ) 10 MG tablet Take 1 tablet (10 mg total) by mouth 3 (three) times daily as needed for anxiety. 270 tablet 0   mirtazapine  (REMERON ) 30 MG tablet TAKE 1 TABLET BY MOUTH EVERYDAY AT BEDTIME 90 tablet 1    promethazine -dextromethorphan (PROMETHAZINE -DM) 6.25-15 MG/5ML syrup Take 5 mLs by mouth 4 (four) times daily as needed for cough. 118 mL 0   No current facility-administered medications for this visit.     Musculoskeletal: Strength & Muscle Tone: UTA Gait & Station: Seated Patient leans: N/A  Psychiatric Specialty Exam: Review of Systems  Psychiatric/Behavioral:  Positive for decreased concentration, dysphoric mood and sleep disturbance. The patient is nervous/anxious.     There were no vitals taken for this visit.There is no height or weight on file to calculate BMI.  General Appearance: Casual  Eye Contact:  Good  Speech:  Clear and Coherent  Volume:  Normal  Mood:  Anxious and Depressed  Affect:  Congruent  Thought Process:  Goal Directed and Descriptions of Associations: Intact  Orientation:  Full (Time, Place, and Person)  Thought Content: Logical   Suicidal Thoughts:  No  Homicidal Thoughts:  No  Memory:  Immediate;   Fair Recent;   Fair Remote;   Fair  Judgement:  Fair  Insight:  Fair  Psychomotor Activity:  Normal  Concentration:  Concentration: Fair and Attention Span: Fair  Recall:  [employer] of Knowledge: Fair  Language: Fair  Akathisia:  No  Handed:  Right  AIMS (if indicated): not done  Assets:  Communication Skills Desire for Improvement Housing Social Support Talents/Skills Transportation  ADL's:  Intact  Cognition: WNL  Sleep:  varies   Screenings: AIMS    Flowsheet Row Admission (Discharged) from 03/07/2019 in [facility]IENT BE[facility] MEDICINE  AIMS Total Score 0   AUDIT    Flowsheet Row Admission (Discharged) from 03/07/2019 in [facility]PAT[facility] BE[facility]ICINE  Alcohol Use Disorder Identification Test Final Score (AUDIT) 0   GAD-7    Flowsheet Row Office Visit from 08/16/2024 in [facility] Medicine at [facility] Office Visit from 05/19/2024 in [facility] Office Visit  from 12/25/2023 in [facility] Care & [facility] Medicine at [facility] Video Visit from 06/30/2023 in [facility] Office Visit from 02/11/2023 in [facility]  Total GAD-7 Score 12 11 0 8 13   PHQ2-9    Flowsheet Row Office Visit from 08/16/2024 in  [facility] at [facility] Office Visit from 05/19/2024 in [facility] Office Visit from 12/25/2023 in [facility] at [facility] Video Visit from 06/30/2023 in [facility] Video Visit from 03/25/2023 in [facility]  PHQ-2 Total Score 4 3 0 0 0  PHQ-9 Total Score 15 14 1  -- --   Flowsheet Row Video Visit from 09/12/2024 in [facility] UC from 07/23/2024 in [facility] at [facility]  Video Visit from 07/11/2024 in [facility]  C-SSRS RISK CATEGORY Moderate Risk No Risk Moderate Risk     Assessment and Plan: ANNALUCIA LAINO is a 33 year old African-American female with history of depression, anxiety, bereavement was evaluated by telemedicine today.  Discussed assessment and plan as noted below.  MDD (major depressive disorder), recurrent episode, moderate (HCC)-unstable Generalized anxiety disorder-unstable Although improving she continues to struggle with mood symptoms.  She is currently awaiting an     echocardiogram, recent EKG repeated came back normal. Continue Lexapro  10 mg daily Continue Mirtazapine  30 mg at bedtime Continue Hydroxyzine  10 mg 3 times a day as needed Will consider initiation of Abilify low dosage once echocardiogram completed and reviewed.  Bereavement-improving Currently coping with grief although she does have her moments. Patient encouraged to establish care with  a therapist.  Provided resources in the community.   Most recent EKG reviewed and discussed with patient dated 08/24/2024-QTc 445-normal sinus rhythm.  Follow-up Follow-up in clinic in 6 to 7 weeks or sooner in person.   Collaboration of Care: Collaboration of Care: Referral or follow-up with counselor/therapist AEB encouraged to establish care with a therapist.  Patient/Guardian was advised Release of Information must be obtained prior to any record release in order to collaborate their care with an outside provider. Patient/Guardian was advised if they have not already done so to contact the registration department to sign all necessary forms in order for us  to release information regarding their care.   Consent: Patient/Guardian gives verbal consent for treatment and assignment of benefits for services provided during this visit. Patient/Guardian expressed understanding and agreed to proceed.  This note was generated in part or whole with voice recognition software. Voice recognition is usually quite accurate but there are transcription errors that can and very often do occur. I apologize for any typographical errors that were not detected and corrected.     Leshawn Houseworth, MD 09/13/2024, 6:06 PM

## 2024-09-15 DIAGNOSIS — Z880 Allergy status to penicillin: Secondary | ICD-10-CM | POA: Diagnosis not present

## 2024-09-15 DIAGNOSIS — R1031 Right lower quadrant pain: Secondary | ICD-10-CM | POA: Diagnosis not present

## 2024-09-15 DIAGNOSIS — Z885 Allergy status to narcotic agent status: Secondary | ICD-10-CM | POA: Diagnosis not present

## 2024-09-15 DIAGNOSIS — Z0389 Encounter for observation for other suspected diseases and conditions ruled out: Secondary | ICD-10-CM | POA: Diagnosis not present

## 2024-09-16 ENCOUNTER — Encounter: Payer: Self-pay | Admitting: Internal Medicine

## 2024-09-16 ENCOUNTER — Telehealth: Payer: Self-pay

## 2024-09-16 ENCOUNTER — Ambulatory Visit: Payer: Self-pay

## 2024-09-16 ENCOUNTER — Other Ambulatory Visit (HOSPITAL_COMMUNITY)
Admission: RE | Admit: 2024-09-16 | Discharge: 2024-09-16 | Disposition: A | Discharge status: Home / Self Care | Source: Ambulatory Visit | Attending: Internal Medicine | Admitting: Internal Medicine

## 2024-09-16 ENCOUNTER — Ambulatory Visit: Admitting: Internal Medicine

## 2024-09-16 VITALS — BP 122/84 | HR 103 | Ht 61.0 in | Wt 217.0 lb

## 2024-09-16 DIAGNOSIS — R1030 Lower abdominal pain, unspecified: Secondary | ICD-10-CM | POA: Insufficient documentation

## 2024-09-16 DIAGNOSIS — R82998 Other abnormal findings in urine: Secondary | ICD-10-CM | POA: Diagnosis not present

## 2024-09-16 MED ORDER — SULFAMETHOXAZOLE-TRIMETHOPRIM 800-160 MG PO TABS: 1.0000 | ORAL_TABLET | Freq: Two times a day (BID) | ORAL | 0 refills | Status: DC

## 2024-09-16 NOTE — Telephone Encounter (Signed)
 Spoke with patient to get clarification of on appointment for today. She is still having abdominal pain and does not know if it is a HPV flare up. She is unable to eat or drink due to nausea. She is taking the tramadol the ER prescribed to her but it is not helping. She states her blood work came back normal but there are things that are high and low. She would like to get to the bottom of her abdominal pain.   JM

## 2024-09-16 NOTE — Progress Notes (Addendum)
 Date:  09/16/2024   Name:  Mallory Johnston   DOB:  01/01/1991   MRN:  [id number]   Chief Complaint: Hospitalization Follow-up (Patient went to ER on 0919/25 for right lower abdominal pain. Patient is taking tramadol but pain is persistent and constant. She has had abdominal pain since 09/11/24. Pain is getting worse scans did not show any abnormalities at the hospital. Patient pain level is a 8/10 today. )  Abdominal Pain This is a new problem. The current episode started in the past 7 days. The problem occurs constantly. The problem has been unchanged. Associated symptoms include nausea and vomiting. Pertinent negatives include no constipation, fever or frequency.  At [facility] ED yesterday CT abd/pelvis at [facility] was unremarkable. US  pelvis - unremarkable, no free fluid, no ovarian torsion UA - + for RBC and Leuks - culture pending Tramadol has not provided any relief.  Review of Systems  Constitutional:  Negative for diaphoresis and fever.  Respiratory:  Negative for chest tightness and shortness of breath.   Cardiovascular:  Negative for chest pain and palpitations.  Gastrointestinal:  Positive for abdominal pain, nausea and vomiting. Negative for blood in stool, constipation and rectal pain.  Genitourinary:  Positive for pelvic pain (lower right). Negative for frequency and urgency.  Psychiatric/Behavioral:  Negative for dysphoric mood and sleep disturbance. The patient is not nervous/anxious.      Lab Results  Component Value Date   NA 139 03/05/2019   K 3.4 (L) 03/05/2019   CO2 25 03/05/2019   GLUCOSE 96 03/05/2019   BUN 9 09/24/2020   CREATININE 0.74 09/24/2020   CALCIUM 9.2 03/05/2019   GFRNONAA >60 09/24/2020   Lab Results  Component Value Date   CHOL 148 03/08/2019   HDL 52 03/08/2019   LDLCALC 73 03/08/2019   TRIG 115 03/08/2019   CHOLHDL 2.8 03/08/2019   Lab Results  Component Value Date   TSH 0.989 11/18/2022   Lab Results  Component Value Date   HGBA1C 5.3  03/08/2019   Lab Results  Component Value Date   WBC 11.0 (H) 10/14/2022   HGB 12.2 10/14/2022   HCT 36.6 10/14/2022   MCV 85.5 10/14/2022   PLT 306 10/14/2022   Lab Results  Component Value Date   ALT 15 03/05/2019   AST 21 03/05/2019   ALKPHOS 65 03/05/2019   BILITOT 0.5 03/05/2019   No results found for: MARIEN BOLLS, VD25OH   Patient Active Problem List   Diagnosis Date Noted   At risk for prolonged QT interval syndrome 07/11/2024   Fatigue 05/19/2024   Hx of von Willebrand's disease 12/08/2022   MDD (major depressive disorder), recurrent episode, moderate (HCC) 11/18/2022   Bereavement 11/18/2022   Anxiety disorder 11/18/2022   Heavy menstrual period 10/14/2022   Migraine without aura and without status migrainosus, not intractable 01/11/2021   Swelling of limb 11/29/2019   Lymphedema 11/29/2019   Class 1 obesity due to excess calories without serious comorbidity with body mass index (BMI) of 30.0 to 30.9 in adult 02/11/2017   History of cervical dysplasia 02/11/2017   Liver lesion, right lobe 10/17/2016   PCOS (polycystic ovarian syndrome) 09/20/2015    Allergies  Allergen Reactions   Salmon [Fish Oil]    Amoxicillin Rash and Hives   Latex Rash   Percocet [Oxycodone-Acetaminophen ] Nausea And Vomiting and Rash    Past Surgical History:  Procedure Laterality Date   BREAST REDUCTION SURGERY  2004   LOWER EXTREMITY VENOGRAPHY Left 09/24/2020  Procedure: LOWER EXTREMITY VENOGRAPHY;  Surgeon: Marea Selinda RAMAN, MD;  Location: [facility] INVASIVE CV LAB;  Service: Cardiovascular;  Laterality: Left;   OVARIAN CYST REMOVAL     REDUCTION MAMMAPLASTY     TONSILLECTOMY      Social History   Tobacco Use   Smoking status: Never   Smokeless tobacco: Never  Vaping Use   Vaping status: Never Used  Substance Use Topics   Alcohol use: Not Currently    Comment: rare   Drug use: No     Medication list has been reviewed and updated.  Current Meds   Medication Sig   albuterol  (VENTOLIN  HFA) 108 (90 Base) MCG/ACT inhaler TAKE 2 PUFFS BY MOUTH EVERY 6 HOURS AS NEEDED FOR WHEEZE OR SHORTNESS OF BREATH   cetirizine  (ZYRTEC  ALLERGY) 10 MG tablet Take 1 tablet (10 mg total) by mouth daily.   escitalopram  (LEXAPRO ) 10 MG tablet Take 1 tablet (10 mg total) by mouth daily with breakfast.   etonogestrel (NEXPLANON) 68 MG IMPL implant 1 device [facility]   fluticasone  (FLONASE ) 50 MCG/ACT nasal spray Place 2 sprays into both nostrils daily.   hydrOXYzine  (ATARAX ) 10 MG tablet Take 1 tablet (10 mg total) by mouth 3 (three) times daily as needed for anxiety.   mirtazapine  (REMERON ) 30 MG tablet TAKE 1 TABLET BY MOUTH EVERYDAY AT BEDTIME   promethazine -dextromethorphan (PROMETHAZINE -DM) 6.25-15 MG/5ML syrup Take 5 mLs by mouth 4 (four) times daily as needed for cough.   sulfamethoxazole -trimethoprim  (BACTRIM  DS) 800-160 MG tablet Take 1 tablet by mouth 2 (two) times daily for 10 days.   traMADol (ULTRAM) 50 MG tablet Take 50 mg by mouth every 6 (six) hours as needed.       09/16/2024    3:58 PM 08/16/2024    4:12 PM 05/19/2024   10:23 AM 12/25/2023    4:05 PM  GAD 7 : Generalized Anxiety Score  Nervous, Anxious, on Edge 1 1  0  Control/stop worrying 2 1  0  Worry too much - different things 2 2  0  Trouble relaxing 1 2  0  Restless 1 1  0  Easily annoyed or irritable 2 3  0  Afraid - awful might happen 0 2  0  Total GAD 7 Score 9 12  0  Anxiety Difficulty Not difficult at all Somewhat difficult  Not difficult at all     Information is confidential and restricted. Go to Review Flowsheets to unlock data.       09/16/2024    3:57 PM 08/16/2024    4:12 PM 05/19/2024   10:22 AM  Depression screen PHQ 2/9  Decreased Interest 0 2   Down, Depressed, Hopeless 0 2   PHQ - 2 Score 0 4   Altered sleeping 2 2   Tired, decreased energy 2 3   Change in appetite 2 3   Feeling bad or failure about yourself  1 2   Trouble  concentrating 0 1   Moving slowly or fidgety/restless 0 0   Suicidal thoughts 0 0   PHQ-9 Score 7 15   Difficult doing work/chores Not difficult at all Somewhat difficult      Information is confidential and restricted. Go to Review Flowsheets to unlock data.    BP Readings from Last 3 Encounters:  09/16/24 122/84  08/24/24 120/76  08/16/24 128/78    Physical Exam Constitutional:      General: She is not in acute distress. Cardiovascular:  Rate and Rhythm: Normal rate and regular rhythm.  Pulmonary:     Effort: Pulmonary effort is normal.  Abdominal:     General: Bowel sounds are normal.     Palpations: Abdomen is soft.     Tenderness: There is abdominal tenderness in the right lower quadrant. There is no guarding or rebound.     Hernia: No hernia is present.  Neurological:     Mental Status: She is alert.     Wt Readings from Last 3 Encounters:  09/16/24 217 lb (98.4 kg)  08/24/24 215 lb (97.5 kg)  08/16/24 213 lb (96.6 kg)    BP 122/84   Pulse (!) 103   Ht 5' 1 (1.549 m)   Wt 217 lb (98.4 kg)   SpO2 100%   BMI 41.00 kg/m   Assessment and Plan:  Problem List Items Addressed This Visit   None Visit Diagnoses       Lower abdominal pain    -  Primary   patient concerned about HPV but not sexually active in 4 years will get Aptima swab to rule out Trich/BV/yeast   Relevant Orders   Cervicovaginal ancillary only     Leukocytes in urine       Moderate Leuks and moderate RBC trace bacteria suspicious for UTI - will treat with Bactrim  take Zofran  as needed for n/v   Relevant Medications   sulfamethoxazole -trimethoprim  (BACTRIM  DS) 800-160 MG tablet       No follow-ups on file.    Leita HILARIO Adie, MD [facility] [place]

## 2024-09-16 NOTE — Telephone Encounter (Signed)
 FYI Only or Action Required?: Action required by provider: request for appointment.  Patient was last seen in primary care on 08/16/2024 by Justus Leita DEL, MD.  Called Nurse Triage reporting Abdominal Pain.  Symptoms began several days ago.  Interventions attempted: Rest, hydration, or home remedies.  Symptoms are: gradually worsening. Seen in ED 09/15/24. Still has pain, awake all night.  Triage Disposition: See HCP Within 4 Hours (Or PCP Triage)  Patient/caregiver understands and will follow disposition?: Yes   Copied from CRM [id number]. Topic: Clinical - Red Word Triage >> Sep 16, 2024  8:21 AM Willma SAUNDERS wrote: [facility] that prompted transfer to Nurse Triage: Patient went to the ED yesterday for abdomen pain, states they ruled out anything serious, but patient is still experiencing pain. Answer Assessment - Initial Assessment Questions 1. LOCATION: Where does it hurt?      Lower right 2. RADIATION: Does the pain shoot anywhere else? (e.g., chest, back)     no 3. ONSET: When did the pain begin? (e.g., minutes, hours or days ago)      Sunday 4. SUDDEN: Gradual or sudden onset?     gradual 5. PATTERN Does the pain come and go, or is it constant?     constant 6. SEVERITY: How bad is the pain?  (e.g., Scale 1-10; mild, moderate, or severe)     8 7. RECURRENT SYMPTOM: Have you ever had this type of stomach pain before? If Yes, ask: When was the last time? and What happened that time?      yes 8. CAUSE: What do you think is causing the stomach pain? (e.g., gallstones, recent abdominal surgery)     unsure 9. RELIEVING/AGGRAVATING FACTORS: What makes it better or worse? (e.g., antacids, bending or twisting motion, bowel movement)     no 10. OTHER SYMPTOMS: Do you have any other symptoms? (e.g., back pain, diarrhea, fever, urination pain, vomiting)       nausea 11. PREGNANCY: Is there any chance you are pregnant? When was your last menstrual period?        no  Protocols used: Abdominal Pain - Female-A-AH  Reason for Disposition  [1] MILD-MODERATE pain AND [2] constant AND [3] present > 2 hours  Answer Assessment - Initial Assessment Questions 1. LOCATION: Where does it hurt?      Lower right 2. RADIATION: Does the pain shoot anywhere else? (e.g., chest, back)     no 3. ONSET: When did the pain begin? (e.g., minutes, hours or days ago)      Sunday 4. SUDDEN: Gradual or sudden onset?     gradual 5. PATTERN Does the pain come and go, or is it constant?     constant 6. SEVERITY: How bad is the pain?  (e.g., Scale 1-10; mild, moderate, or severe)     8 7. RECURRENT SYMPTOM: Have you ever had this type of stomach pain before? If Yes, ask: When was the last time? and What happened that time?      yes 8. CAUSE: What do you think is causing the stomach pain? (e.g., gallstones, recent abdominal surgery)     unsure 9. RELIEVING/AGGRAVATING FACTORS: What makes it better or worse? (e.g., antacids, bending or twisting motion, bowel movement)     no 10. OTHER SYMPTOMS: Do you have any other symptoms? (e.g., back pain, diarrhea, fever, urination pain, vomiting)       nausea 11. PREGNANCY: Is there any chance you are pregnant? When was your last menstrual period?  no  Protocols used: Abdominal Pain - Female-A-AH

## 2024-09-16 NOTE — Telephone Encounter (Signed)
 Noted  Pt has appt.  KP

## 2024-09-20 ENCOUNTER — Ambulatory Visit: Payer: Self-pay | Admitting: Internal Medicine

## 2024-09-20 ENCOUNTER — Telehealth: Payer: Self-pay

## 2024-09-20 ENCOUNTER — Other Ambulatory Visit: Payer: Self-pay | Admitting: Internal Medicine

## 2024-09-20 DIAGNOSIS — B3731 Acute candidiasis of vulva and vagina: Secondary | ICD-10-CM

## 2024-09-20 DIAGNOSIS — R1030 Lower abdominal pain, unspecified: Secondary | ICD-10-CM

## 2024-09-20 LAB — CERVICOVAGINAL ANCILLARY ONLY
Bacterial Vaginitis (gardnerella): NEGATIVE
Candida Glabrata: NEGATIVE
Candida Vaginitis: POSITIVE — AB
Comment: NEGATIVE
Comment: NEGATIVE
Comment: NEGATIVE

## 2024-09-20 MED ORDER — FLUCONAZOLE 100 MG PO TABS: 100.0000 mg | ORAL_TABLET | Freq: Every day | ORAL | 0 refills | Status: AC

## 2024-09-20 NOTE — Transitions of Care (Post Inpatient/ED Visit) (Signed)
   09/20/2024  Name: Mallory Johnston MRN: [id number] DOB: 01-07-1991  Today's TOC FU Call Status: Today's TOC FU Call Status:: Successful TOC FU Call Completed Unsuccessful Call (1st Attempt) Date: 09/20/24 [facility] FU Call Complete Date: 09/20/24 Patient's Name and Date of Birth confirmed.  Transition Care Management Follow-up Telephone Call Date of Discharge: 09/15/24 Discharge Facility: Other [facility]) Name of Other (Non[facility]) Discharge Facility: [facility] Type of Discharge: Emergency Department Reason for ED Visit: Other: How have you been since you were released from the hospital?: Same Any questions or concerns?: No  Items Reviewed: Did you receive and understand the discharge instructions provided?: No Medications obtained,verified, and reconciled?: Yes (Medications Reviewed) Any new allergies since your discharge?: No Dietary orders reviewed?: NA Do you have support at home?: Yes People in Home [RPT]: parent(s)  Medications Reviewed Today: Medications Reviewed Today     Reviewed by Wren Gallaga N, CMA (Certified Medical Assistant) on 09/20/24 at 1525  Med List Status: <None>   Medication Order Taking? Sig Documenting Provider Last Dose Status Informant  albuterol  (VENTOLIN  HFA) 108 (90 Base) MCG/ACT inhaler [id number] Yes TAKE 2 PUFFS BY MOUTH EVERY 6 HOURS AS NEEDED FOR WHEEZE OR SHORTNESS OF SHERIDA Justus Leita VEAR, MD  Active   cetirizine  (ZYRTEC  ALLERGY) 10 MG tablet [id number] Yes Take 1 tablet (10 mg total) by mouth daily. Lavell Lye A, FNP  Active   escitalopram  (LEXAPRO ) 10 MG tablet [id number] Yes Take 1 tablet (10 mg total) by mouth daily with breakfast. Eappen, Saramma, MD  Active   etonogestrel (NEXPLANON) 68 MG IMPL implant [id number] Yes 1 device provided by [facility] [provider]  Active   fluconazole  (DIFLUCAN ) 100 MG tablet [id number] Yes Take 1 tablet (100 mg total) by mouth daily for 3 days. Justus Leita VEAR, MD  Active    fluticasone  (FLONASE ) 50 MCG/ACT nasal spray [id number] Yes Place 2 sprays into both nostrils daily. Lavell Lye A, FNP  Active   hydrOXYzine  (ATARAX ) 10 MG tablet [id number] Yes Take 1 tablet (10 mg total) by mouth 3 (three) times daily as needed for anxiety. Eappen, Saramma, MD  Active   mirtazapine  (REMERON ) 30 MG tablet [id number] Yes TAKE 1 TABLET BY MOUTH EVERYDAY AT BEDTIME Okey Barnie SAUNDERS, MD  Active    Discontinued 09/20/24 1524 (Completed Course)    Discontinued 09/20/24 1524 (Completed Course)   traMADol (ULTRAM) 50 MG tablet [id number] Yes Take 50 mg by mouth every 6 (six) hours as needed. [provider]  Active             Home Care and Equipment/Supplies: Were Home Health Services Ordered?: NA Any new equipment or medical supplies ordered?: NA  Functional Questionnaire: Do you need assistance with bathing/showering or dressing?: No Do you need assistance with meal preparation?: No Do you need assistance with eating?: No Do you have difficulty maintaining continence: No Do you need assistance with getting out of bed/getting out of a chair/moving?: No Do you have difficulty managing or taking your medications?: No  Follow up appointments reviewed: PCP Follow-up appointment confirmed?: NA Specialist Hospital Follow-up appointment confirmed?: NA Do you need transportation to your follow-up appointment?: No Do you understand care options if your condition(s) worsen?: Yes-patient verbalized understanding   Raywood Wailes Will  Primary Care & Sports Medicine [facility] CMA, AAMA [address]  [place] [address] Office [phone]  Fax: [phone]

## 2024-09-20 NOTE — Progress Notes (Unsigned)
 Date:  09/20/2024   Name:  Mallory Johnston   DOB:  08/05/1991   MRN:  [id number]   Chief Complaint: No chief complaint on file.  HPI  Review of Systems   Lab Results  Component Value Date   NA 139 03/05/2019   K 3.4 (L) 03/05/2019   CO2 25 03/05/2019   GLUCOSE 96 03/05/2019   BUN 9 09/24/2020   CREATININE 0.74 09/24/2020   CALCIUM 9.2 03/05/2019   GFRNONAA >60 09/24/2020   Lab Results  Component Value Date   CHOL 148 03/08/2019   HDL 52 03/08/2019   LDLCALC 73 03/08/2019   TRIG 115 03/08/2019   CHOLHDL 2.8 03/08/2019   Lab Results  Component Value Date   TSH 0.989 11/18/2022   Lab Results  Component Value Date   HGBA1C 5.3 03/08/2019   Lab Results  Component Value Date   WBC 11.0 (H) 10/14/2022   HGB 12.2 10/14/2022   HCT 36.6 10/14/2022   MCV 85.5 10/14/2022   PLT 306 10/14/2022   Lab Results  Component Value Date   ALT 15 03/05/2019   AST 21 03/05/2019   ALKPHOS 65 03/05/2019   BILITOT 0.5 03/05/2019   No results found for: MARIEN BOLLS, VD25OH   Patient Active Problem List   Diagnosis Date Noted   At risk for prolonged QT interval syndrome 07/11/2024   Fatigue 05/19/2024   Hx of von Willebrand's disease 12/08/2022   MDD (major depressive disorder), recurrent episode, moderate (HCC) 11/18/2022   Bereavement 11/18/2022   Anxiety disorder 11/18/2022   Heavy menstrual period 10/14/2022   Migraine without aura and without status migrainosus, not intractable 01/11/2021   Swelling of limb 11/29/2019   Lymphedema 11/29/2019   Class 1 obesity due to excess calories without serious comorbidity with body mass index (BMI) of 30.0 to 30.9 in adult 02/11/2017   History of cervical dysplasia 02/11/2017   Liver lesion, right lobe 10/17/2016   PCOS (polycystic ovarian syndrome) 09/20/2015    Allergies  Allergen Reactions   Salmon [Fish Oil]    Amoxicillin Rash and Hives   Latex Rash   Percocet [Oxycodone-Acetaminophen ] Nausea And  Vomiting and Rash    Past Surgical History:  Procedure Laterality Date   BREAST REDUCTION SURGERY  2004   LOWER EXTREMITY VENOGRAPHY Left 09/24/2020   Procedure: LOWER EXTREMITY VENOGRAPHY;  Surgeon: Marea Selinda RAMAN, MD;  Location: [facility] INVASIVE CV LAB;  Service: Cardiovascular;  Laterality: Left;   OVARIAN CYST REMOVAL     REDUCTION MAMMAPLASTY     TONSILLECTOMY      Social History   Tobacco Use   Smoking status: Never   Smokeless tobacco: Never  Vaping Use   Vaping status: Never Used  Substance Use Topics   Alcohol use: Not Currently    Comment: rare   Drug use: No     Medication list has been reviewed and updated.  No outpatient medications have been marked as taking for the 09/20/24 encounter (Orders Only) with Justus Leita DEL, MD.       09/16/2024    3:58 PM 08/16/2024    4:12 PM 05/19/2024   10:23 AM 12/25/2023    4:05 PM  GAD 7 : Generalized Anxiety Score  Nervous, Anxious, on Edge 1 1  0  Control/stop worrying 2 1  0  Worry too much - different things 2 2  0  Trouble relaxing 1 2  0  Restless 1 1  0  Easily annoyed or  irritable 2 3  0  Afraid - awful might happen 0 2  0  Total GAD 7 Score 9 12  0  Anxiety Difficulty Not difficult at all Somewhat difficult  Not difficult at all     Information is confidential and restricted. Go to Review Flowsheets to unlock data.       09/16/2024    3:57 PM 08/16/2024    4:12 PM 05/19/2024   10:22 AM  Depression screen PHQ 2/9  Decreased Interest 0 2   Down, Depressed, Hopeless 0 2   PHQ - 2 Score 0 4   Altered sleeping 2 2   Tired, decreased energy 2 3   Change in appetite 2 3   Feeling bad or failure about yourself  1 2   Trouble concentrating 0 1   Moving slowly or fidgety/restless 0 0   Suicidal thoughts 0 0   PHQ-9 Score 7 15   Difficult doing work/chores Not difficult at all Somewhat difficult      Information is confidential and restricted. Go to Review Flowsheets to unlock data.    BP Readings from  Last 3 Encounters:  09/16/24 122/84  08/24/24 120/76  08/16/24 128/78    Physical Exam  Wt Readings from Last 3 Encounters:  09/16/24 217 lb (98.4 kg)  08/24/24 215 lb (97.5 kg)  08/16/24 213 lb (96.6 kg)    There were no vitals taken for this visit.  Assessment and Plan:  Problem List Items Addressed This Visit   None   No follow-ups on file.    Leita HILARIO Adie, MD [facility] Primary Care and Sports Medicine [place]

## 2024-09-20 NOTE — Telephone Encounter (Signed)
 Spoke with patient over the phone for Transition of Care phone visit. Dr Justus has ordered a HIDA Scan and she would like to be called soon to get this scheduled. Patient is having a lot of intermittent abd pain.

## 2024-09-20 NOTE — Transitions of Care (Post Inpatient/ED Visit) (Signed)
   09/20/2024  Name: Mallory Johnston MRN: [id number] DOB: May 31, 1991  Today's TOC FU Call Status: Today's TOC FU Call Status:: Unsuccessful Call (1st Attempt) Unsuccessful Call (1st Attempt) Date: 09/20/24  Attempted to reach the patient regarding the most recent Inpatient/ED visit.  Follow Up Plan: Additional outreach attempts will be made to reach the patient to complete the Transitions of Care (Post Inpatient/ED visit) call.   Jedrick Hutcherson [facility]  Primary Care & Sports Medicine [facility] CMA, AAMA [address]  [place] [address] Office [phone]  Fax: [phone]

## 2024-09-26 ENCOUNTER — Ambulatory Visit: Attending: Physician Assistant

## 2024-09-26 DIAGNOSIS — R0602 Shortness of breath: Secondary | ICD-10-CM

## 2024-09-26 LAB — ECHOCARDIOGRAM COMPLETE
AR max vel: 2.64 cm2
AV Area VTI: 2.38 cm2
AV Area mean vel: 2.5 cm2
AV Mean grad: 3 mmHg
AV Peak grad: 5.2 mmHg
Ao pk vel: 1.14 m/s
Area-P 1/2: 5.13 cm2
S' Lateral: 2.2 cm

## 2024-09-27 ENCOUNTER — Ambulatory Visit: Payer: Self-pay | Admitting: Physician Assistant

## 2024-09-29 ENCOUNTER — Encounter
Admission: RE | Admit: 2024-09-29 | Discharge: 2024-09-29 | Disposition: A | Discharge status: Home / Self Care | Source: Ambulatory Visit | Attending: Internal Medicine | Admitting: Internal Medicine

## 2024-09-29 DIAGNOSIS — R1030 Lower abdominal pain, unspecified: Secondary | ICD-10-CM | POA: Insufficient documentation

## 2024-09-29 DIAGNOSIS — R109 Unspecified abdominal pain: Secondary | ICD-10-CM | POA: Diagnosis not present

## 2024-09-29 MED ORDER — TECHNETIUM TC 99M MEBROFENIN IV KIT
5.0200 | PACK | Freq: Once | INTRAVENOUS | Status: AC | PRN
  Administered 2024-09-29: 5.02 via INTRAVENOUS

## 2024-09-30 ENCOUNTER — Ambulatory Visit: Payer: Self-pay | Admitting: Internal Medicine

## 2024-09-30 ENCOUNTER — Other Ambulatory Visit: Payer: Self-pay

## 2024-09-30 DIAGNOSIS — R1030 Lower abdominal pain, unspecified: Secondary | ICD-10-CM

## 2024-10-06 NOTE — Telephone Encounter (Signed)
 Please check on referral to get pt scheduled.  KP

## 2024-10-26 ENCOUNTER — Ambulatory Visit: Admitting: Psychiatry

## 2024-10-27 DIAGNOSIS — F603 Borderline personality disorder: Secondary | ICD-10-CM | POA: Diagnosis not present

## 2024-10-31 NOTE — Progress Notes (Unsigned)
  Cardiology Office Note   Date:  11/01/2024  ID:  SHABANA ARMENTROUT, DOB November 23, 1991, MRN [id number] PCP: Justus Leita DEL, MD  [facility] Providers Cardiologist:  Caron Poser, MD     History of Present Illness Mallory Johnston is a 34 y.o. female PMH VWD, depression who presents for follow-up.  Previously seen by Bernardino Bring on 08/24/2024 for an abnormal ECG.  Repeat ECG during office visit was normal so the prior was felt to be due to poor lead placement.  Ultimately got an echocardiogram which was essentially normal.  Since last visit, she continues to have intermittent palpitations and chest discomfort.  The chest pains typically are happening at rest.  She denies any family history of premature ASCVD.  She does not know any appreciable pattern to the palpitations.  No syncope.  Relevant CVD History -TTE 09/26/2024 LVEF 55 to 60% without significant valvular disease   ROS: Pt denies any chest discomfort, jaw pain, arm pain, palpitations, syncope, presyncope, orthopnea, PND, or LE edema.  Studies Reviewed I have independently reviewed the patient's ECG, previous medical records, previous cardiac testing.  Physical Exam VS:  BP (!) 114/90   Pulse (!) 113   Ht 5' 1 (1.549 m)   Wt 214 lb 12.8 oz (97.4 kg)   SpO2 98%   BMI 40.59 kg/m        Wt Readings from Last 3 Encounters:  11/01/24 214 lb 12.8 oz (97.4 kg)  09/16/24 217 lb (98.4 kg)  08/24/24 215 lb (97.5 kg)    GEN: No acute distress. NECK: No JVD; No carotid bruits. CARDIAC: RRR, no murmurs, rubs, gallops. RESPIRATORY:  Clear to auscultation. EXTREMITIES:  Warm and well-perfused. No edema.  ASSESSMENT AND PLAN Abnormal ECG Chest discomfort Sinus tachycardia History of VWD Patient presents for follow-up of chest discomfort and palpitations.  She recently had an echocardiogram which was normal, which is very reassuring.  She has intermittent chest discomfort which occurs at rest.  She does not have any high  risk family history of premature ASCVD.  She also has a persistent sinus tachycardia on the last several office visits, so do wonder about IST.  She is overall low pretest probability for obstructive CAD; I think her elevated heart rates would preclude a diagnostic CT coronary angiogram.  Plan: - Zio monitor evaluate for any arrhythmia and to obtain a mean heart rate; if she has IST, then we will discuss ivabradine - ETT given chest discomfort - D-dimer to rule out VTE given history of VWD; if positive, then we will need to obtain lower extremity venous Dopplers and a CTPA - Check TSH     Informed Consent   The risks [chest pain, shortness of breath, cardiac arrhythmias, dizziness, blood pressure fluctuations, myocardial infarction, stroke/transient ischemic attack, and life-threatening complications (estimated to be 1 in 10,000)], benefits (risk stratification, diagnosing coronary artery disease, treatment guidance) and alternatives of an exercise tolerance test were discussed in detail with Ms. Lyerly and she agrees to proceed.     Dispo: RTC as needed based on results of cardiac testing  Signed, Caron Poser, MD

## 2024-11-01 ENCOUNTER — Ambulatory Visit

## 2024-11-01 VITALS — BP 114/90 | HR 113 | Ht 61.0 in | Wt 214.8 lb

## 2024-11-01 DIAGNOSIS — R079 Chest pain, unspecified: Secondary | ICD-10-CM

## 2024-11-01 DIAGNOSIS — R9431 Abnormal electrocardiogram [ECG] [EKG]: Secondary | ICD-10-CM | POA: Diagnosis not present

## 2024-11-01 DIAGNOSIS — R000 Tachycardia, unspecified: Secondary | ICD-10-CM | POA: Diagnosis not present

## 2024-11-01 DIAGNOSIS — Z862 Personal history of diseases of the blood and blood-forming organs and certain disorders involving the immune mechanism: Secondary | ICD-10-CM

## 2024-11-01 DIAGNOSIS — Z79899 Other long term (current) drug therapy: Secondary | ICD-10-CM

## 2024-11-01 NOTE — Patient Instructions (Signed)
 Medication Instructions:   Your physician recommends that you continue on your current medications as directed. Please refer to the Current Medication list given to you today.   *If you need a refill on your cardiac medications before your next appointment, please call your pharmacy*  Lab Work: Your provider would like for you to have following labs drawn today D-DIMER, TSH.   If you have labs (blood work) drawn today and your tests are completely normal, you will receive your results only by: MyChart Message (if you have MyChart) OR A paper copy in the mail If you have any lab test that is abnormal or we need to change your treatment, we will call you to review the results.  Testing/Procedures:  Your provider has ordered a exercise tolerance test. This test will evaluate the blood supply to your heart muscle during periods of exercise and rest. For this test, you will raise your heart rate by walking on a treadmill at different levels.   you may eat a light breakfast/ lunch prior to your procedure no caffeine for 24 hours prior to your test (coffee, tea, soft drinks, or chocolate)  bring any inhalers with you to your test wear comfortable clothing & tennis/ non-skid shoes to walk on the treadmill  This will take place at [address] [facility])  [place] [phone]   GEOFFRY HEWS- Long Term Monitor Instructions  Your physician has requested you wear a ZIO patch monitor for 14 days.  This is a single patch monitor. [employer] supplies one patch monitor per enrollment. Additional stickers are not available. Please do not apply patch if you will be having a Nuclear Stress Test, Echocardiogram, Cardiac CT, MRI, or Chest Xray during the period you would be wearing the monitor. The patch cannot be worn during these tests. You cannot remove and re-apply the ZIO XT patch monitor.  Your ZIO patch monitor will be mailed 3 day USPS to your address on file. It may take 3-5 days to receive  your monitor after you have been enrolled. Once you have received your monitor, please review the enclosed instructions. Your monitor has already been registered assigning a specific monitor serial number to you.  Billing and Patient Assistance Program Information  We have supplied [employer] with any of your insurance information on file for billing purposes.  [employer] offers a sliding scale Patient Assistance Program for patients that do not have insurance, or whose insurance does not completely cover the cost of the ZIO monitor.  You must apply for the Patient Assistance Program to qualify for this discounted rate.  To apply, please call [employer] at [phone], select option 4, select option 2, ask to apply for Patient Assistance Program. Meredeth will ask your household income, and how many people are in your household. They will quote your out-of-pocket cost based on that information. [employer] will also be able to set up a 89-month, interest-free payment plan if needed.  Applying the monitor   Hold abrader disc by orange tab. Rub abrader in 40 strokes over the upper left chest as indicated in your monitor instructions.  Clean area with 4 enclosed alcohol pads. Let dry.  Apply patch as indicated in monitor instructions. Patch will be placed under collarbone on left side of chest with arrow pointing upward.  Rub patch adhesive wings for 2 minutes. Remove white label marked 1. Remove the white label marked 2. Rub patch adhesive wings for 2 additional minutes.  While looking in a mirror, press  and release button in center of patch. A small green light will flash 3-4 times. This will be your only indicator that the monitor has been turned on.   After Applying Monitor: Do not shower for the first 24 hours. You may shower after the first 24 hours. (Keep back toward water) Press the button if you feel a symptom. You will hear a small click. Record Date, Time and Symptom in the Patient Logbook.   After  Completing 14 Days: When you are ready to remove the patch, follow instructions on the last 2 pages of Patient Logbook.  Stick patch monitor into the tabs at the bottom of the return box.  Place Patient Logbook in the blue and white box. Use locking tab on box and tape box closed securely. The blue and white box has prepaid postage on it. Please place it in the mailbox as soon as possible. Your physician should have your test results approximately 7-14 days after the monitor has been mailed back to [facility].   Troubleshooting: Call [facility] at [phone] if you have questions regarding your ZIO XT patch monitor.  Call them immediately if you see an orange light blinking on your monitor.  If your monitor falls off in less than 4 days, contact our Monitor department at [phone].  If your monitor becomes loose or falls off after 4 days call [employer] at [phone] for suggestions on securing your monitor.   Follow-Up: At [facility], you and your health needs are our priority.  As part of our continuing mission to provide you with exceptional heart care, our providers are all part of one team.  This team includes your primary Cardiologist (physician) and Advanced Practice Providers or APPs (Physician Assistants and Nurse Practitioners) who all work together to provide you with the care you need, when you need it.  Your next appointment:   As Needed   Provider:   Caron Poser, MD    We recommend signing up for the patient portal called MyChart.  Sign up information is provided on this After Visit Summary.  MyChart is used to connect with patients for Virtual Visits (Telemedicine).  Patients are able to view lab/test results, encounter notes, upcoming appointments, etc.  Non-urgent messages can be sent to your provider as well.   To learn more about what you can do with MyChart, go to [email or web address].

## 2024-11-02 ENCOUNTER — Ambulatory Visit: Payer: Self-pay

## 2024-11-02 LAB — TSH: TSH: 2.1 u[IU]/mL (ref 0.450–4.500)

## 2024-11-02 LAB — D-DIMER, QUANTITATIVE: D-DIMER: <0.2 mg{FEU}/L (ref 0.00–0.49)

## 2024-11-09 ENCOUNTER — Other Ambulatory Visit: Payer: Self-pay

## 2024-11-09 ENCOUNTER — Emergency Department

## 2024-11-09 ENCOUNTER — Encounter: Payer: Self-pay | Admitting: Emergency Medicine

## 2024-11-09 ENCOUNTER — Emergency Department: Admission: EM | Admit: 2024-11-09 | Discharge: 2024-11-09 | Disposition: A | Discharge status: Home / Self Care

## 2024-11-09 DIAGNOSIS — R079 Chest pain, unspecified: Secondary | ICD-10-CM | POA: Diagnosis not present

## 2024-11-09 DIAGNOSIS — R5383 Other fatigue: Secondary | ICD-10-CM | POA: Diagnosis not present

## 2024-11-09 DIAGNOSIS — R0789 Other chest pain: Secondary | ICD-10-CM | POA: Insufficient documentation

## 2024-11-09 DIAGNOSIS — R42 Dizziness and giddiness: Secondary | ICD-10-CM | POA: Diagnosis not present

## 2024-11-09 LAB — BASIC METABOLIC PANEL WITH GFR
Anion gap: 11 (ref 5–15)
BUN: 8 mg/dL (ref 6–20)
CO2: 24 mmol/L (ref 22–32)
Calcium: 9.4 mg/dL (ref 8.9–10.3)
Chloride: 106 mmol/L (ref 98–111)
Creatinine, Ser: 0.75 mg/dL (ref 0.44–1.00)
GFR, Estimated: >60 mL/min (ref >60)
Glucose, Bld: 92 mg/dL (ref 70–99)
Potassium: 3.9 mmol/L (ref 3.5–5.1)
Sodium: 141 mmol/L (ref 135–145)

## 2024-11-09 LAB — TROPONIN T, HIGH SENSITIVITY: Troponin T High Sensitivity: <15 ng/L (ref 0–19)

## 2024-11-09 LAB — CBC
HCT: 40.6 % (ref 36.0–46.0)
Hemoglobin: 12.7 g/dL (ref 12.0–15.0)
MCH: 27.7 pg (ref 26.0–34.0)
MCHC: 31.3 g/dL (ref 30.0–36.0)
MCV: 88.5 fL (ref 80.0–100.0)
Platelets: 345 K/uL (ref 150–400)
RBC: 4.59 MIL/uL (ref 3.87–5.11)
RDW: 14.7 % (ref 11.5–15.5)
WBC: 9.5 K/uL (ref 4.0–10.5)
nRBC: 0 % (ref 0.0–0.2)

## 2024-11-09 MED ORDER — OMEPRAZOLE MAGNESIUM 20 MG PO TBEC: 20.0000 mg | DELAYED_RELEASE_TABLET | Freq: Every day | ORAL | 0 refills | Status: DC

## 2024-11-09 NOTE — Discharge Instructions (Addendum)
 You were seen today due to concern of chest pain.  At this time fortunately your labs are reassuring.  I have written for a brief course of medication for you to take at home, please take this as instructed.  If you have any worsening of symptoms such as increasing verity of chest pain, lightheadedness, shortness of breath, or any other symptoms you find concerning please return to the emergency department immediately for further medical management.

## 2024-11-09 NOTE — ED Provider Notes (Signed)
 [facility] Provider Note    Event Date/Time   First MD Initiated Contact with Patient 11/09/24 [phone]     (approximate)   History   Chest Pain   HPI  Mallory Johnston is a 33 y.o. female who presents with concern of chest pain.  Apparently has been having off-and-on chest pain every few months for the last year.  She sees a cardiologist, and had a recent echo cardiogram done which was reassuring.  She is currently has a cardiac monitor in place that she has random episodes of tachycardia.  Last night she had a episode of severe chest pain lasting about 15 to 20 minutes and then resolved, she was able to go to bed at the time this morning when she woke up she went to work, but still felt a little bit off, complaining of some increased fatigue and lightheadedness.  Lightheadedness at rest, described as cloudiness or dizziness at times but does not seem to worsen with head motion or positional changes and is not to the severity that she has had the sit down or had any falls from this.  No history of early onset cardiac death in the family.  Denies other known medical problems.  She has tried Toradol  apparently for the chest pain but has not had any relief of symptoms with this.  She does have the Nexplanon in place denies any prior history of PEs and DVTs and apparently had a recent workup for this which was without any acute findings.  Has not noticed any leg swelling no recent surgeries.      Physical Exam   Triage Vital Signs: ED Triage Vitals  Encounter Vitals Group     BP 11/09/24 0848 (!) 129/105     Girls Systolic BP Percentile --      Girls Diastolic BP Percentile --      Boys Systolic BP Percentile --      Boys Diastolic BP Percentile --      Pulse Rate 11/09/24 0848 (!) 106     Resp 11/09/24 0848 17     Temp 11/09/24 0848 98.6 F (37 C)     Temp Source 11/09/24 0848 Oral     SpO2 11/09/24 0848 100 %     Weight 11/09/24 0849 214 lb (97.1 kg)      Height 11/09/24 0849 5' 1 (1.549 m)     Head Circumference --      Peak Flow --      Pain Score 11/09/24 0849 8     Pain Loc --      Pain Education --      Exclude from Growth Chart --     Most recent vital signs: Vitals:   11/09/24 0924 11/09/24 1039  BP: 121/79 113/83  Pulse: 87 83  Resp:  18  Temp:  98 F (36.7 C)  SpO2: 100% 100%     General: Awake, no distress.  CV:  Good peripheral perfusion.  Regular rate and rhythm Resp:  Normal effort.  Abd:  No distention.  Soft nontender Other:     ED Results / Procedures / Treatments   Labs (all labs ordered are listed, but only abnormal results are displayed) Labs Reviewed  BASIC METABOLIC PANEL WITH GFR  CBC  POC URINE PREG, ED  TROPONIN T, HIGH SENSITIVITY     EKG  My depend interpretation of this EKG shows a sinus rhythm with rate of about 95, axis of -20, intervals  appear to be within normal limits and no obvious ischemia.  Comparable to the prior EKG.   RADIOLOGY No acute findings  PROCEDURES:  Critical Care performed: No  Procedures   MEDICATIONS ORDERED IN ED: Medications - No data to display   IMPRESSION / MDM / ASSESSMENT AND PLAN / ED COURSE  I reviewed the triage vital signs and the nursing notes.                               Patient's presentation is most consistent with acute, uncomplicated illness.  33 year old female who presents today with complaint of chest pain.  She appears overall well she is not in any acute distress, reproducible pain on chest wall palpation.  She has seen a cardiologist in the outpatient setting and has had a echocardiogram done as well as a thorough cardiac workup.  She is scheduled to see a gastroenterologist in about 2 weeks.  Her labs here are overall reassuring EKG and chest x-ray without acute findings.  Given the presentation I believe reasonable for discharge home with outpatient cardiology and primary follow-up.  I discussed return precautions with the  patient and she agrees with the plan.       FINAL CLINICAL IMPRESSION(S) / ED DIAGNOSES   Final diagnoses:  Chest pain, unspecified type     Rx / DC Orders   ED Discharge Orders          Ordered    Ambulatory referral to Cardiology       Comments: If you have not heard from the Cardiology office within the next 72 hours please call [phone].   11/09/24 1009    omeprazole (PRILOSEC OTC) 20 MG tablet  Daily        11/09/24 1009             Note:  This document was prepared using Dragon voice recognition software and may include unintentional dictation errors.   Fernand Rossie HERO, MD 11/09/24 [phone]

## 2024-11-09 NOTE — ED Triage Notes (Signed)
 Patient to ED via POV for centralized CP. Started yesterday while at work. Has heart monitor on currently for intermittent CP/tachycardia.

## 2024-11-15 DIAGNOSIS — Z3046 Encounter for surveillance of implantable subdermal contraceptive: Secondary | ICD-10-CM | POA: Diagnosis not present

## 2024-11-15 DIAGNOSIS — Z3009 Encounter for other general counseling and advice on contraception: Secondary | ICD-10-CM | POA: Diagnosis not present

## 2024-11-21 NOTE — Progress Notes (Unsigned)
 11/22/2024 LARRAINE ARGO [id number] 1991-06-30  Gastroenterology Office Note    Referring Provider: Justus Leita DEL, MD Primary Care Physician:  Justus Leita DEL, MD  Primary GI Provider: Jinny Carmine, MD    Chief Complaint   Chief Complaint  Patient presents with   New Patient (Initial Visit)    Abd pain was lower right quadrant- pain mostly after meals- -constipation October-does not take anything-     History of Present Illness   Mallory Johnston is a 33 y.o. female with PMHX of PCOS, anxiety, depression, von Willebrand's disease, presenting today at the request of Justus Leita DEL, MD due to lower abdominal pain  Patient reports she started having abdominal pain around 09/11/2024.  She thought it was a cyst on her ovaries but ultrasound did not show any abnormalities.  She reports pain is in right lower quadrant and towards the center of her abdomen. It gets worse after she eats.  She will feel hungry but when she starts eating the pain will start.  She has some mild cramping every day, some days are worse than others.  She states it does depend on what she eats.  He has noticed that fried greasy foods make it worse and she cannot tolerate dairy.  She does get nauseous if she eats certain foods, denies vomiting.   Patient reports that she also has constipation.  She normally will have a bowel movement every 2 to 3 days.  Sometimes she does have to strain to get the bowel movement started.  Yesterday she reports she was at work and it took her 15 minutes to try to go to the bathroom.  Occasionally she may have some diarrhea or looser stools depending on what she eats.  She drinks about 32 ounces of water daily.  Denies NSAID use, rare alcohol use. Denies hematochezia or melena.  Patient reports that she had her Nexplanon  removed recently because she was having GI symptoms and also was seen for chest pain.  She was not sure if this was causing her symptoms.   09/29/2024  HIDA scan - Patent cystic and common bile ducts. - Normal gallbladder ejection fraction.  Patient seen at [facility] emergency room on 09/15/2024 with complaints of RLQ abdominal pain.  CT abdomen pelvis without acute findings.  Denies family history of colon cancer.   Past Medical History:  Diagnosis Date   Adenomyosis 05/2024   Anxiety    Depression    H/O bilateral breast reduction surgery    History of left oophorectomy 2009   due to ovarian cyst   Lymphadenopathy 2009   left leg   Lymphedema of left leg    S/P tonsillectomy    Suicidal ideation 03/06/2019   Von Willebrand disease (HCC) 2009    Past Surgical History:  Procedure Laterality Date   BREAST REDUCTION SURGERY  2004   LOWER EXTREMITY VENOGRAPHY Left 09/24/2020   Procedure: LOWER EXTREMITY VENOGRAPHY;  Surgeon: Marea Selinda RAMAN, MD;  Location: [facility] INVASIVE CV LAB;  Service: Cardiovascular;  Laterality: Left;   OVARIAN CYST REMOVAL     REDUCTION MAMMAPLASTY     TONSILLECTOMY      Current Outpatient Medications  Medication Sig Dispense Refill   albuterol  (VENTOLIN  HFA) 108 (90 Base) MCG/ACT inhaler TAKE 2 PUFFS BY MOUTH EVERY 6 HOURS AS NEEDED FOR WHEEZE OR SHORTNESS OF BREATH 3 each 1   cetirizine  (ZYRTEC  ALLERGY) 10 MG tablet Take 1 tablet (10 mg total) by mouth daily. 90 tablet  1   escitalopram  (LEXAPRO ) 10 MG tablet Take 1 tablet (10 mg total) by mouth daily with breakfast. 90 tablet 1   fluticasone  (FLONASE ) 50 MCG/ACT nasal spray Place 2 sprays into both nostrils daily. 16 g 6   mirtazapine  (REMERON ) 30 MG tablet TAKE 1 TABLET BY MOUTH EVERYDAY AT BEDTIME 90 tablet 1   hydrOXYzine  (ATARAX ) 10 MG tablet Take 1 tablet (10 mg total) by mouth 3 (three) times daily as needed for anxiety. 270 tablet 0   No current facility-administered medications for this visit.    Allergies as of 11/22/2024 - Review Complete 11/22/2024  Allergen Reaction Noted   Salmon [fish oil]  07/17/2022   Amoxicillin Rash and Hives 09/20/2015    Latex Rash 06/01/2024   Percocet [oxycodone-acetaminophen ] Nausea And Vomiting and Rash 09/20/2015    Family History  Problem Relation Age of Onset   Depression Mother    Diabetes Mother    Heart attack Father 95       stent placement   Hyperlipidemia Father    Heart disease Father 64   Hypertension Father    Breast cancer Neg Hx    Ovarian cancer Neg Hx    Colon cancer Neg Hx     Social History   Socioeconomic History   Marital status: Single    Spouse name: Not on file   Number of children: Not on file   Years of education: Not on file   Highest education level: Some college, no degree  Occupational History   Not on file  Tobacco Use   Smoking status: Never   Smokeless tobacco: Never  Vaping Use   Vaping status: Never Used  Substance and Sexual Activity   Alcohol use: Not Currently    Comment: rare   Drug use: No   Sexual activity: Not Currently    Birth control/protection: Pill  Other Topics Concern   Not on file  Social History Narrative   Not on file   Social Drivers of Health   Financial Resource Strain: Low Risk  (11/07/2022)   Overall Financial Resource Strain (CARDIA)    Difficulty of Paying Living Expenses: Not hard at all  Food Insecurity: No Food Insecurity (11/07/2022)   Hunger Vital Sign    Worried About Running Out of Food in the Last Year: Never true    Ran Out of Food in the Last Year: Never true  Transportation Needs: No Transportation Needs (11/07/2022)   PRAPARE - Administrator, Civil Service (Medical): No    Lack of Transportation (Non-Medical): No  Physical Activity: Not on file  Stress: Not on file  Social Connections: Not on file  Intimate Partner Violence: Not At Risk (11/07/2022)   Humiliation, Afraid, Rape, and Kick questionnaire    Fear of Current or Ex-Partner: No    Emotionally Abused: No    Physically Abused: No    Sexually Abused: No     RELEVANT GI HISTORY, IMAGING AND LABS: CBC    Component Value  Date/Time   WBC 9.5 11/09/2024 0850   RBC 4.59 11/09/2024 0850   HGB 12.7 11/09/2024 0850   HGB 13.3 05/19/2014 0206   HCT 40.6 11/09/2024 0850   HCT 41.7 05/19/2014 0206   PLT 345 11/09/2024 0850   PLT 277 05/19/2014 0206   MCV 88.5 11/09/2024 0850   MCV 92 05/19/2014 0206   MCH 27.7 11/09/2024 0850   MCHC 31.3 11/09/2024 0850   RDW 14.7 11/09/2024 0850   RDW 13.7  05/19/2014 0206   LYMPHSABS 2.5 10/14/2022 1458   LYMPHSABS 2.3 05/07/2014 1323   MONOABS 0.5 10/14/2022 1458   MONOABS 0.8 05/07/2014 1323   EOSABS 0.2 10/14/2022 1458   EOSABS 0.5 05/07/2014 1323   BASOSABS 0.0 10/14/2022 1458   BASOSABS 0.1 05/07/2014 1323   Recent Labs    11/09/24 0850  HGB 12.7    CMP     Component Value Date/Time   NA 141 11/09/2024 0850   NA 137 05/19/2014 0206   K 3.9 11/09/2024 0850   K 4.0 05/19/2014 0206   CL 106 11/09/2024 0850   CL 103 05/19/2014 0206   CO2 24 11/09/2024 0850   CO2 28 05/19/2014 0206   GLUCOSE 92 11/09/2024 0850   GLUCOSE 93 05/19/2014 0206   BUN 8 11/09/2024 0850   BUN 10 05/19/2014 0206   CREATININE 0.75 11/09/2024 0850   CREATININE 0.88 05/19/2014 0206   CALCIUM 9.4 11/09/2024 0850   CALCIUM 9.2 05/19/2014 0206   PROT 9.0 (H) 03/05/2019 1935   PROT 8.8 (H) 03/04/2013 1510   ALBUMIN 4.5 03/05/2019 1935   ALBUMIN 3.9 03/04/2013 1510   AST 21 03/05/2019 1935   AST 76 (H) 03/04/2013 1510   ALT 15 03/05/2019 1935   ALT 97 (H) 03/04/2013 1510   ALKPHOS 65 03/05/2019 1935   ALKPHOS 107 03/04/2013 1510   BILITOT 0.5 03/05/2019 1935   BILITOT 0.3 03/04/2013 1510   GFRNONAA >60 11/09/2024 0850   GFRNONAA >60 05/19/2014 0206   GFRAA >60 09/24/2020 0759   GFRAA >60 05/19/2014 0206      Latest Ref Rng & Units 03/05/2019    7:35 PM 03/04/2013    3:10 PM 02/28/2013    9:44 AM  Hepatic Function  Total Protein 6.5 - 8.1 g/dL 9.0  8.8  8.5   Albumin 3.5 - 5.0 g/dL 4.5  3.9  3.8   AST 15 - 41 U/L 21  76  20   ALT 0 - 44 U/L 15  97  28   Alk Phosphatase 38  - 126 U/L 65  107  90   Total Bilirubin 0.3 - 1.2 mg/dL 0.5  0.3  0.3       Review of Systems   All systems reviewed and negative except where noted in HPI.    Physical Exam  BP 113/78   Pulse (!) 111   Temp 98.4 F (36.9 C)   Ht 5' 1 (1.549 m)   Wt 219 lb 12.8 oz (99.7 kg)   LMP 04/28/2024   SpO2 97%   BMI 41.53 kg/m  Patient's last menstrual period was 04/28/2024. General:   Alert and oriented. Pleasant and cooperative. Well-nourished and well-developed. Obese. NAD. Head:  Normocephalic and atraumatic. Eyes:  Without icterus Ears:  Normal auditory acuity. Neck:  Supple; no masses or thyromegaly. Lungs:  Respirations even and unlabored.  Clear throughout to auscultation.   No wheezes, crackles, or rhonchi. No acute distress. Heart:  Regular rate and rhythm; no murmurs, clicks, rubs, or gallops. Abdomen:  Normal bowel sounds.  No bruits.  Mild TTP RLQ and periumbilical, and non-distended without masses, hepatosplenomegaly or hernias noted.  No guarding or rebound tenderness.  Positive Carnett sign.   Rectal:  Deferred. Msk:  Symmetrical without gross deformities. Normal posture. Extremities:  Without edema. Neurologic:  Alert and  oriented x4;  grossly normal neurologically. Skin:  Intact without significant lesions or rashes. Psych:  Alert and cooperative. Normal mood and affect.   Assessment &  Plan   SUHAYLA CHISOM is a 33 y.o. female presenting today with abdominal pain and constipation.   Abdominal pain. RLQ and periumbilical pain. CT unremarkable 09/15/2024. Has pain and cramping after she eats. Also with Positive Carnett's sign RLQ. - will order celiac panel and fecal calprotectin  - May also be aspect of musculoskeletal skeletal abdominal pain. Can try salonpas, heating pad, avoid straining muscles. May take Advil 400 mg 3 times a day for up to 2 weeks and to stop if she should have any pain worsening or black stools.     Constipation - Recommend High Fiber  diet with fruits, vegetables, and whole grains. - Drink 64 ounces of Fluids Daily. - Start Miralax Mix 1 capful in a drink daily. - Start Benefiber Mix 1 TBSP in a drink daily. - If no improvement, consider Linzess, Amitiza or Trulance.  Follow up in 6 weeks  I discussed the assessment and treatment plan with the patient. The patient was provided an opportunity to ask questions and all were answered. The patient agreed with the plan and demonstrated an understanding of the instructions.   The patient was advised to call back or seek an in-person evaluation if the symptoms worsen or if the condition fails to improve as anticipated.  Grayce Bohr, DNP, AGNP-C [facility]

## 2024-11-22 ENCOUNTER — Encounter: Payer: Self-pay | Admitting: Psychiatry

## 2024-11-22 ENCOUNTER — Telehealth (INDEPENDENT_AMBULATORY_CARE_PROVIDER_SITE_OTHER): Admitting: Psychiatry

## 2024-11-22 ENCOUNTER — Ambulatory Visit: Admitting: Family Medicine

## 2024-11-22 ENCOUNTER — Encounter: Payer: Self-pay | Admitting: Family Medicine

## 2024-11-22 VITALS — BP 113/78 | HR 111 | Temp 98.4°F | Ht 61.0 in | Wt 219.8 lb

## 2024-11-22 DIAGNOSIS — R1031 Right lower quadrant pain: Secondary | ICD-10-CM | POA: Diagnosis not present

## 2024-11-22 DIAGNOSIS — F331 Major depressive disorder, recurrent, moderate: Secondary | ICD-10-CM | POA: Diagnosis not present

## 2024-11-22 DIAGNOSIS — F411 Generalized anxiety disorder: Secondary | ICD-10-CM

## 2024-11-22 DIAGNOSIS — Z634 Disappearance and death of family member: Secondary | ICD-10-CM

## 2024-11-22 DIAGNOSIS — K5909 Other constipation: Secondary | ICD-10-CM

## 2024-11-22 DIAGNOSIS — R1033 Periumbilical pain: Secondary | ICD-10-CM

## 2024-11-22 MED ORDER — HYDROXYZINE HCL 10 MG PO TABS: 10.0000 mg | ORAL_TABLET | Freq: Three times a day (TID) | ORAL | 0 refills | Status: AC | PRN

## 2024-11-22 NOTE — Progress Notes (Signed)
 Virtual Visit via Video Note  I connected with Mallory Johnston on 11/22/24 at  3:30 PM EST by a video enabled telemedicine application and verified that I am speaking with the correct person using two identifiers.  Location Provider Location : [facility] Patient Location : Home  Participants: Patient , Provider    I discussed the limitations of evaluation and management by telemedicine and the availability of in person appointments. The patient expressed understanding and agreed to proceed.   I discussed the assessment and treatment plan with the patient. The patient was provided an opportunity to ask questions and all were answered. The patient agreed with the plan and demonstrated an understanding of the instructions.   The patient was advised to call back or seek an in-person evaluation if the symptoms worsen or if the condition fails to improve as anticipated.  [facility] MD OP Progress Note  11/22/2024 3:46 PM Mallory Johnston  MRN:  [id number]  Chief Complaint:  Chief Complaint  Patient presents with   Follow-up   Anxiety   Depression   Medication Refill   Discussed the use of AI scribe software for clinical note transcription with the patient, who gave verbal consent to proceed.  History of Present Illness Mallory Johnston is a 33 year old African-American female, employed, lives in [place], has a history of MDD, bereavement, GAD, von Willebrand disease, PCOS was evaluated by telemedicine today.  Sleep difficulties continue to affect her, as she describes frequent awakenings and restlessness most nights, with occasional nights of more restful sleep. On a recent night, she reports waking almost every hour, while on other nights she wakes 2 to 3 times. Frustration with these sleep disruptions remains, and she expresses uncertainty about the cause, noting that she sometimes experiences a good night's sleep but often feels restless. Her current medications include mirtazapine  and Lexapro ,  and she uses hydroxyzine  10 mg three times daily as needed.  Work-related frustration persists, particularly in response to new changes at her company, yet she states she has been able to function at work overall. She denies any thoughts of harming herself or others.  She recently completed an intake with a new therapist likely with [facility] and has scheduled her first full appointment for December 11.  She is currently struggling with multiple physical complaints including abdominal pain/cramps, chest pain which led to multiple emergency department visit.  She completed a consultation with gastroenterology today and is currently undergoing workup.  She was advised to wear a Holter monitor and she has upcoming appointment with cardiology for the same as well.  She denies current use of cannabis or alcohol.  Plans for Thanksgiving include spending time with family and friends. She recently took time off work.     Visit Diagnosis:    ICD-10-CM   1. MDD (major depressive disorder), recurrent episode, moderate (HCC)  F33.1     2. Bereavement  Z63.4 hydrOXYzine  (ATARAX ) 10 MG tablet    3. Generalized anxiety disorder  F41.1       Past Psychiatric History: I have reviewed past psychiatric history from progress note on 11/18/2022.  Past trials of medications like trazodone , zolpidem.  History of suicide attempt in March 2020 involving overdosing on pills.  Past Medical History:  Past Medical History:  Diagnosis Date   Adenomyosis 05/2024   Anxiety    Depression    H/O bilateral breast reduction surgery    History of left oophorectomy 2009   due to ovarian cyst   Lymphadenopathy  2009   left leg   Lymphedema of left leg    S/P tonsillectomy    Suicidal ideation 03/06/2019   Von Willebrand disease (HCC) 2009    Past Surgical History:  Procedure Laterality Date   BREAST REDUCTION SURGERY  2004   LOWER EXTREMITY VENOGRAPHY Left 09/24/2020   Procedure: LOWER EXTREMITY  VENOGRAPHY;  Surgeon: Marea Selinda RAMAN, MD;  Location: [facility] INVASIVE CV LAB;  Service: Cardiovascular;  Laterality: Left;   OVARIAN CYST REMOVAL     REDUCTION MAMMAPLASTY     TONSILLECTOMY      Family Psychiatric History: I have reviewed family psychiatric history from progress note on 11/18/2022.  Family History:  Family History  Problem Relation Age of Onset   Depression Mother    Diabetes Mother    Heart attack Father 62       stent placement   Hyperlipidemia Father    Heart disease Father 22   Hypertension Father    Breast cancer Neg Hx    Ovarian cancer Neg Hx    Colon cancer Neg Hx     Social History: I have reviewed social history from progress note on 11/18/2022. Social History   Socioeconomic History   Marital status: Single    Spouse name: Not on file   Number of children: Not on file   Years of education: Not on file   Highest education level: Some college, no degree  Occupational History   Not on file  Tobacco Use   Smoking status: Never   Smokeless tobacco: Never  Vaping Use   Vaping status: Never Used  Substance and Sexual Activity   Alcohol use: Not Currently    Comment: rare   Drug use: No   Sexual activity: Not Currently    Birth control/protection: Pill  Other Topics Concern   Not on file  Social History Narrative   Not on file   Social Drivers of Health   Financial Resource Strain: Low Risk  (11/07/2022)   Overall Financial Resource Strain (CARDIA)    Difficulty of Paying Living Expenses: Not hard at all  Food Insecurity: No Food Insecurity (11/07/2022)   Hunger Vital Sign    Worried About Running Out of Food in the Last Year: Never true    Ran Out of Food in the Last Year: Never true  Transportation Needs: No Transportation Needs (11/07/2022)   PRAPARE - Administrator, Civil Service (Medical): No    Lack of Transportation (Non-Medical): No  Physical Activity: Not on file  Stress: Not on file  Social Connections: Not on file     Allergies:  Allergies  Allergen Reactions   Salmon [Fish Oil]    Amoxicillin Rash and Hives   Latex Rash   Percocet [Oxycodone-Acetaminophen ] Nausea And Vomiting and Rash    Metabolic Disorder Labs: Lab Results  Component Value Date   HGBA1C 5.3 03/08/2019   MPG 105.41 03/08/2019   No results found for: PROLACTIN Lab Results  Component Value Date   CHOL 148 03/08/2019   TRIG 115 03/08/2019   HDL 52 03/08/2019   CHOLHDL 2.8 03/08/2019   VLDL 23 03/08/2019   LDLCALC 73 03/08/2019   Lab Results  Component Value Date   TSH 2.100 11/01/2024   TSH 0.989 11/18/2022    Therapeutic Level Labs: No results found for: LITHIUM No results found for: VALPROATE No results found for: CBMZ  Current Medications: Current Outpatient Medications  Medication Sig Dispense Refill   albuterol  (VENTOLIN  HFA)  108 (90 Base) MCG/ACT inhaler TAKE 2 PUFFS BY MOUTH EVERY 6 HOURS AS NEEDED FOR WHEEZE OR SHORTNESS OF BREATH 3 each 1   cetirizine  (ZYRTEC  ALLERGY) 10 MG tablet Take 1 tablet (10 mg total) by mouth daily. 90 tablet 1   escitalopram  (LEXAPRO ) 10 MG tablet Take 1 tablet (10 mg total) by mouth daily with breakfast. 90 tablet 1   fluticasone  (FLONASE ) 50 MCG/ACT nasal spray Place 2 sprays into both nostrils daily. 16 g 6   hydrOXYzine  (ATARAX ) 10 MG tablet Take 1 tablet (10 mg total) by mouth 3 (three) times daily as needed for anxiety. 270 tablet 0   mirtazapine  (REMERON ) 30 MG tablet TAKE 1 TABLET BY MOUTH EVERYDAY AT BEDTIME 90 tablet 1   No current facility-administered medications for this visit.     Musculoskeletal: Strength & Muscle Tone: UTA Gait & Station: Seated Patient leans: N/A  Psychiatric Specialty Exam: Review of Systems  Psychiatric/Behavioral:  Positive for decreased concentration, dysphoric mood and sleep disturbance. The patient is nervous/anxious.     Last menstrual period 04/28/2024.There is no height or weight on file to calculate BMI.  General  Appearance: Casual  Eye Contact:  Fair  Speech:  Clear and Coherent  Volume:  Normal  Mood:  Anxious and Depressed  Affect:  Congruent  Thought Process:  Goal Directed and Descriptions of Associations: Intact  Orientation:  Full (Time, Place, and Person)  Thought Content: Logical   Suicidal Thoughts:  No  Homicidal Thoughts:  No  Memory:  Immediate;   Fair Recent;   Fair Remote;   Fair  Judgement:  Fair  Insight:  Fair  Psychomotor Activity:  Normal  Concentration:  Concentration: Fair and Attention Span: Fair  Recall:  [employer] of Knowledge: Fair  Language: Fair  Akathisia:  No  Handed:  Right  AIMS (if indicated): not done  Assets:  Communication Skills Desire for Improvement Housing Social Support Transportation  ADL's:  Intact  Cognition: WNL  Sleep:  varies   Screenings: AIMS    Flowsheet Row Admission (Discharged) from 03/07/2019 in [facility] INPATIENT BEHAV[facility]AL MEDICINE  AIMS Total Score 0   AUDIT    Flowsheet Row Admission (Discharged) from 03/07/2019 in [facility] INPATIENT BEHAVIORAL MEDICINE  Alcohol Use Disorder Identification Test Final Score (AUDIT) 0   GAD-7    Flowsheet Row Office Visit from 09/16/2024 in [facility] at [facility] Office Visit from 08/16/2024 in [facility] Primary Care & Sports Medicine at [facility] Office Visit from 05/19/2024 in [facility] Office Visit from 12/25/2023 in [facility] Primary Care & Sports Medicine at [facility] Video Visit from 06/30/2023 in [facility]  Total GAD-7 Score 9 12 11  0 8   PHQ2-9    Flowsheet Row Office Visit from 09/16/2024 in [facility] Primary Care & Sports Medicine at [facility] Office Visit from 08/16/2024 in [facility] Primary Care & Sports Medicine at [facility] Office Visit from 05/19/2024 in [facility] Office Visit from  12/25/2023 in [facility] Primary Care & Sports Medicine at [facility] Video Visit from 06/30/2023 in [facility]  PHQ-2 Total Score 0 4 3 0 0  PHQ-9 Total Score 7 15 14 1  --   Flowsheet Row Video Visit from 11/22/2024 in [facility] ED from 11/09/2024 in [facility] Emergency Department at [facility] Video Visit  from 09/12/2024 in [facility]  C-SSRS RISK CATEGORY Moderate Risk No Risk Moderate Risk     Assessment and Plan: Mallory Johnston is a 33 year old African-American female who presented for a follow-up appointment, discussed assessment and plan as noted below.  1. MDD (major depressive disorder), recurrent episode, moderate (HCC)-unstable Ongoing mood symptoms mostly due to current health issues.  Will not make any medication readjustments today.  Patient currently awaiting cardiology/gastroenterology clearance. Continue Lexapro  10 mg daily Continue Mirtazapine  30 mg at bedtime Consider initiation of Abilify low dosage in the future  2. Bereavement-improving Did not mention any concerns regarding grief. Will reevaluate in future session.  3. Generalized anxiety disorder-unstable Current anxiety symptoms mostly related to situational stresses/physical complaints leading to multiple urgent care/emergency department visits recently. Patient currently awaiting gastroenterology/cardiology clearance Continue current medication regimen which includes Lexapro  and Mirtazapine . Patient does have Hydroxyzine  10 mg 3 times a day as needed advised to use it for anxiety as well as sleep.  She has not been using it.  Follow-up Follow-up in clinic in    Collaboration of Care: Collaboration of Care: Referral or follow-up with counselor/therapist AEB encouraged to follow-up with therapist, currently scheduled to establish care.  Patient to sign an ROI to  coordinate care.  Will also coordinate care with gastroenterology/cardiology.  Patient/Guardian was advised Release of Information must be obtained prior to any record release in order to collaborate their care with an outside provider. Patient/Guardian was advised if they have not already done so to contact the registration department to sign all necessary forms in order for us  to release information regarding their care.   Consent: Patient/Guardian gives verbal consent for treatment and assignment of benefits for services provided during this visit. Patient/Guardian expressed understanding and agreed to proceed.   This note was generated in part or whole with voice recognition software. Voice recognition is usually quite accurate but there are transcription errors that can and very often do occur. I apologize for any typographical errors that were not detected and corrected.    Florean Hoobler, MD 11/22/2024, 3:46 PM

## 2024-11-22 NOTE — Patient Instructions (Signed)
 Recommend High Fiber diet with fruits, vegetables, and whole grains. Drink 64 ounces of Fluids Daily. Start Miralax Mix 1 capful in a drink daily. Start Benefiber Mix 1 TBSP in a drink daily.

## 2024-11-28 DIAGNOSIS — R1033 Periumbilical pain: Secondary | ICD-10-CM | POA: Diagnosis not present

## 2024-11-29 ENCOUNTER — Ambulatory Visit: Payer: Self-pay | Admitting: Family Medicine

## 2024-11-29 DIAGNOSIS — R079 Chest pain, unspecified: Secondary | ICD-10-CM | POA: Diagnosis not present

## 2024-11-29 DIAGNOSIS — R000 Tachycardia, unspecified: Secondary | ICD-10-CM

## 2024-11-29 LAB — CELIAC DISEASE AB SCREEN W/RFX
Deamidated Gliadin Abs, IgA: 3 U (ref 0–19)
Immunoglobulin A, (IgA) QN, Serum: 218 mg/dL (ref 87–352)
t-Transglutaminase (tTG) IgA: <2 U/mL (ref 0–3)

## 2024-12-01 LAB — CALPROTECTIN, FECAL: Calprotectin, Fecal: 17 ug/g (ref 0–120)

## 2024-12-06 DIAGNOSIS — F603 Borderline personality disorder: Secondary | ICD-10-CM | POA: Diagnosis not present

## 2024-12-26 DIAGNOSIS — F603 Borderline personality disorder: Secondary | ICD-10-CM | POA: Diagnosis not present

## 2024-12-28 ENCOUNTER — Other Ambulatory Visit: Payer: Self-pay

## 2024-12-30 NOTE — Progress Notes (Signed)
 "   01/02/2025 Mallory Johnston [id number] 10-27-1991  Gastroenterology Office Note     Primary Care Physician:  Justus Leita DEL, MD  Primary GI Provider: Jinny Carmine, MD    Chief Complaint   Chief Complaint  Patient presents with   Follow-up    Stool is more liquid-gassy-lower abd pain-dairy foods cause cramping     History of Present Illness   Mallory Johnston is a 34 y.o. female with PMHX of PCOS, anxiety, depression, von Willebrand's disease presenting today for follow up.   Discussed the use of AI scribe software for clinical note transcription with the patient, who gave verbal consent to proceed.  Intermittent abdominal pain persists, often related to food intake. Severe pain episodes have occurred after eating foods such as pizza, sometimes requiring her to assume a fetal position and followed by urgent bowel movements. Morning abdominal pain occurs before eating, and pain can also occur without food intake. Nausea is episodic, denies vomiting. Coffee is consumed daily. She has experimented with lactose-free creamers and milk, and her mother suspects lactose intolerance. Dietary modifications include lactose-free products, fiber adjustments, and avoidance of spicy foods except for occasional buffalo chicken wings, as spicy foods worsen symptoms. She only drinks about 30 ounces of water daily.  Bowel habits remain variable, with daily or every-other-day bowel movements and rare intervals of up to two days without a bowel movement. Stools are predominantly soft, with occasional hard stools and no regular diarrhea, though looser stools occur depending on dietary intake. Miralax is used every other day, as daily use resulted in excessively soft stools. No regular rectal pain or itching. A single episode of rectal bleeding occurred one month ago, attributed to straining, with a small amount noted on wiping and no recurrence.  Patient last seen by myself on 11/22/2024 due to  abdominal pain and constipation.  Patient to start MiraLAX and Benefiber. celiac negative and fecal calprotectin within normal range.   09/29/2024 HIDA scan - Patent cystic and common bile ducts. - Normal gallbladder ejection fraction.   Patient seen at [facility] emergency room on 09/15/2024 with complaints of RLQ abdominal pain.  CT abdomen pelvis without acute findings.   Denies family history of colon cancer.   Past Medical History:  Diagnosis Date   Adenomyosis 05/2024   Anxiety    Depression    H/O bilateral breast reduction surgery    History of left oophorectomy 2009   due to ovarian cyst   Lymphadenopathy 2009   left leg   Lymphedema of left leg    S/P tonsillectomy    Suicidal ideation 03/06/2019   Von Willebrand disease (HCC) 2009    Past Surgical History:  Procedure Laterality Date   BREAST REDUCTION SURGERY  2004   LOWER EXTREMITY VENOGRAPHY Left 09/24/2020   Procedure: LOWER EXTREMITY VENOGRAPHY;  Surgeon: Marea Selinda RAMAN, MD;  Location: [facility] INVASIVE CV LAB;  Service: Cardiovascular;  Laterality: Left;   OVARIAN CYST REMOVAL     REDUCTION MAMMAPLASTY     TONSILLECTOMY      Current Outpatient Medications  Medication Sig Dispense Refill   albuterol  (VENTOLIN  HFA) 108 (90 Base) MCG/ACT inhaler TAKE 2 PUFFS BY MOUTH EVERY 6 HOURS AS NEEDED FOR WHEEZE OR SHORTNESS OF BREATH 3 each 1   cetirizine  (ZYRTEC  ALLERGY) 10 MG tablet Take 1 tablet (10 mg total) by mouth daily. 90 tablet 1   dicyclomine  (BENTYL ) 10 MG capsule Take 1 capsule (10 mg total) by mouth 4 (four) times daily -  before meals and at bedtime. 90 capsule 0   escitalopram  (LEXAPRO ) 10 MG tablet Take 1 tablet (10 mg total) by mouth daily with breakfast. 90 tablet 1   fluticasone  (FLONASE ) 50 MCG/ACT nasal spray Place 2 sprays into both nostrils daily. 16 g 6   hydrOXYzine  (ATARAX ) 10 MG tablet Take 1 tablet (10 mg total) by mouth 3 (three) times daily as needed for anxiety. 270 tablet 0   mirtazapine  (REMERON )  30 MG tablet TAKE 1 TABLET BY MOUTH EVERYDAY AT BEDTIME 90 tablet 1   norelgestromin-ethinyl estradiol  (XULANE) 150-35 MCG/24HR transdermal patch Place 1 patch onto the skin once a week.     No current facility-administered medications for this visit.    Allergies as of 01/02/2025 - Review Complete 01/02/2025  Allergen Reaction Noted   Salmon [fish oil]  07/17/2022   Amoxicillin Rash and Hives 09/20/2015   Latex Rash 06/01/2024   Percocet [oxycodone-acetaminophen ] Nausea And Vomiting and Rash 09/20/2015    Family History  Problem Relation Age of Onset   Depression Mother    Diabetes Mother    Heart attack Father 72       stent placement   Hyperlipidemia Father    Heart disease Father 110   Hypertension Father    Breast cancer Neg Hx    Ovarian cancer Neg Hx    Colon cancer Neg Hx     Social History   Socioeconomic History   Marital status: Single    Spouse name: Not on file   Number of children: Not on file   Years of education: Not on file   Highest education level: Some college, no degree  Occupational History   Not on file  Tobacco Use   Smoking status: Never   Smokeless tobacco: Never  Vaping Use   Vaping status: Never Used  Substance and Sexual Activity   Alcohol use: Not Currently    Comment: rare   Drug use: No   Sexual activity: Not Currently    Birth control/protection: Pill  Other Topics Concern   Not on file  Social History Narrative   Not on file   Social Drivers of Health   Tobacco Use: Low Risk (11/22/2024)   Patient History    Smoking Tobacco Use: Never    Smokeless Tobacco Use: Never    Passive Exposure: Not on file  Financial Resource Strain: Low Risk (11/07/2022)   Overall Financial Resource Strain (CARDIA)    Difficulty of Paying Living Expenses: Not hard at all  Food Insecurity: No Food Insecurity (11/07/2022)   Hunger Vital Sign    Worried About Running Out of Food in the Last Year: Never true    Ran Out of Food in the Last Year:  Never true  Transportation Needs: No Transportation Needs (11/07/2022)   PRAPARE - Administrator, Civil Service (Medical): No    Lack of Transportation (Non-Medical): No  Physical Activity: Not on file  Stress: Not on file  Social Connections: Not on file  Intimate Partner Violence: Not At Risk (11/07/2022)   Humiliation, Afraid, Rape, and Kick questionnaire    Fear of Current or Ex-Partner: No    Emotionally Abused: No    Physically Abused: No    Sexually Abused: No  Depression (PHQ2-9): Medium Risk (09/16/2024)   Depression (PHQ2-9)    PHQ-2 Score: 7  Alcohol Screen: Low Risk (03/06/2022)   Alcohol Screen    Last Alcohol Screening Score (AUDIT): 1  Housing: Low Risk (11/07/2022)  Housing    Last Housing Risk Score: 0  Utilities: Not At Risk (11/07/2022)   AHC Utilities    Threatened with loss of utilities: No  Health Literacy: Not on file     RELEVANT GI HISTORY, IMAGING AND LABS: CBC    Component Value Date/Time   WBC 9.5 11/09/2024 0850   RBC 4.59 11/09/2024 0850   HGB 12.7 11/09/2024 0850   HGB 13.3 05/19/2014 0206   HCT 40.6 11/09/2024 0850   HCT 41.7 05/19/2014 0206   PLT 345 11/09/2024 0850   PLT 277 05/19/2014 0206   MCV 88.5 11/09/2024 0850   MCV 92 05/19/2014 0206   MCH 27.7 11/09/2024 0850   MCHC 31.3 11/09/2024 0850   RDW 14.7 11/09/2024 0850   RDW 13.7 05/19/2014 0206   LYMPHSABS 2.5 10/14/2022 1458   LYMPHSABS 2.3 05/07/2014 1323   MONOABS 0.5 10/14/2022 1458   MONOABS 0.8 05/07/2014 1323   EOSABS 0.2 10/14/2022 1458   EOSABS 0.5 05/07/2014 1323   BASOSABS 0.0 10/14/2022 1458   BASOSABS 0.1 05/07/2014 1323   Recent Labs    11/09/24 0850  HGB 12.7    CMP     Component Value Date/Time   NA 141 11/09/2024 0850   NA 137 05/19/2014 0206   K 3.9 11/09/2024 0850   K 4.0 05/19/2014 0206   CL 106 11/09/2024 0850   CL 103 05/19/2014 0206   CO2 24 11/09/2024 0850   CO2 28 05/19/2014 0206   GLUCOSE 92 11/09/2024 0850   GLUCOSE 93  05/19/2014 0206   BUN 8 11/09/2024 0850   BUN 10 05/19/2014 0206   CREATININE 0.75 11/09/2024 0850   CREATININE 0.88 05/19/2014 0206   CALCIUM 9.4 11/09/2024 0850   CALCIUM 9.2 05/19/2014 0206   PROT 9.0 (H) 03/05/2019 1935   PROT 8.8 (H) 03/04/2013 1510   ALBUMIN 4.5 03/05/2019 1935   ALBUMIN 3.9 03/04/2013 1510   AST 21 03/05/2019 1935   AST 76 (H) 03/04/2013 1510   ALT 15 03/05/2019 1935   ALT 97 (H) 03/04/2013 1510   ALKPHOS 65 03/05/2019 1935   ALKPHOS 107 03/04/2013 1510   BILITOT 0.5 03/05/2019 1935   BILITOT 0.3 03/04/2013 1510   GFRNONAA >60 11/09/2024 0850   GFRNONAA >60 05/19/2014 0206   GFRAA >60 09/24/2020 0759   GFRAA >60 05/19/2014 0206      Latest Ref Rng & Units 03/05/2019    7:35 PM 03/04/2013    3:10 PM 02/28/2013    9:44 AM  Hepatic Function  Total Protein 6.5 - 8.1 g/dL 9.0  8.8  8.5   Albumin 3.5 - 5.0 g/dL 4.5  3.9  3.8   AST 15 - 41 U/L 21  76  20   ALT 0 - 44 U/L 15  97  28   Alk Phosphatase 38 - 126 U/L 65  107  90   Total Bilirubin 0.3 - 1.2 mg/dL 0.5  0.3  0.3       Review of Systems   All systems reviewed and negative except where noted in HPI.    Physical Exam  BP 120/80   Pulse 96   Temp 98 F (36.7 C)   Ht 5' 1 (1.549 m)   Wt 220 lb (99.8 kg)   LMP 12/07/2024   SpO2 100%   BMI 41.57 kg/m  Patient's last menstrual period was 12/07/2024. General:   Alert and oriented. Pleasant and cooperative. Well-nourished and well-developed.  Head:  Normocephalic and atraumatic.  Eyes:  Without icterus Ears:  Normal auditory acuity. Abdomen:  Normal bowel sounds.  No bruits.  TP periumbilical, soft, non-distended without masses, hepatosplenomegaly or hernias noted.  No guarding or rebound tenderness.  Rectal:  Deferred. Msk:  Symmetrical without gross deformities. Normal posture. Extremities:  Without edema. Neurologic:  Alert and  oriented x4;  grossly normal neurologically. Skin:  Intact without significant lesions or rashes. Psych:  Alert  and cooperative. Normal mood and affect.   Assessment & Plan   Mallory Johnston is a 34 y.o. female presenting today for follow with IBS- constipation.    IBS- constipation predominant. Symptoms exacerbated by dietary triggers and stress.  - Educated on low FODMAP diet and identified dietary triggers such as spicy, greasy foods, and artificial sweeteners. - Instructed to take Lactaid if consuming dairy products.  - Discussed  titration of MiraLax with gradual fiber reintroduction to optimize stool consistency and minimize straining. Increase water to 64 ounces daily.  - Prescribed dicyclomine  10 mg up to four times daily for abdominal pain control. - Instructed to monitor and report further rectal bleeding; consider colonoscopy if bleeding recurs.  Follow up in 3 months  Grayce Bohr, DNP, AGNP-C [facility]   "

## 2025-01-02 ENCOUNTER — Encounter: Payer: Self-pay | Admitting: Family Medicine

## 2025-01-02 ENCOUNTER — Ambulatory Visit: Admitting: Family Medicine

## 2025-01-02 VITALS — BP 120/80 | HR 96 | Temp 98.0°F | Ht 61.0 in | Wt 220.0 lb

## 2025-01-02 DIAGNOSIS — K581 Irritable bowel syndrome with constipation: Secondary | ICD-10-CM | POA: Diagnosis not present

## 2025-01-02 MED ORDER — DICYCLOMINE HCL 10 MG PO CAPS: 10.0000 mg | ORAL_CAPSULE | Freq: Three times a day (TID) | ORAL | 0 refills | Status: AC

## 2025-01-02 NOTE — Patient Instructions (Addendum)
" ° ° °  Start OTC Benefiber Powder. Mix 1 - 2 Tablespoons in 6 - 8 ounces of a Drink Once Daily. Drink 64 ounces of water / fluids Daily.   Recommend high-fiber diet, 30 g of fiber daily Eat fruits, vegetables, and whole grains    "

## 2025-01-11 ENCOUNTER — Ambulatory Visit

## 2025-01-11 DIAGNOSIS — R079 Chest pain, unspecified: Secondary | ICD-10-CM | POA: Diagnosis not present

## 2025-01-11 DIAGNOSIS — R000 Tachycardia, unspecified: Secondary | ICD-10-CM | POA: Diagnosis not present

## 2025-01-12 ENCOUNTER — Other Ambulatory Visit: Payer: Self-pay

## 2025-01-12 ENCOUNTER — Encounter: Payer: Self-pay | Admitting: Psychiatry

## 2025-01-12 ENCOUNTER — Ambulatory Visit: Admitting: Psychiatry

## 2025-01-12 VITALS — BP 120/86 | HR 91 | Temp 97.4°F | Ht 61.0 in | Wt 226.8 lb

## 2025-01-12 DIAGNOSIS — F411 Generalized anxiety disorder: Secondary | ICD-10-CM | POA: Diagnosis not present

## 2025-01-12 DIAGNOSIS — Z634 Disappearance and death of family member: Secondary | ICD-10-CM | POA: Diagnosis not present

## 2025-01-12 DIAGNOSIS — F3341 Major depressive disorder, recurrent, in partial remission: Secondary | ICD-10-CM

## 2025-01-12 NOTE — Progress Notes (Unsigned)
 [facility] MD OP Progress Note  01/12/2025 4:39 PM TAMURA LASKY  MRN:  [id number]  Chief Complaint:  Chief Complaint  Patient presents with   Follow-up   Anxiety   Depression   Medication Refill   Discussed the use of AI scribe software for clinical note transcription with the patient, who gave verbal consent to proceed.  History of Present Illness Mallory Johnston is a 34 year old African-American female, employed, lives in [place], has a history of MDD, bereavement, GAD, von Willebrand disease, PCOS was evaluated in office today for a follow-up appointment.  Sleep difficulties continue, with frequent awakenings during the night and periods of wakefulness lasting 30 minutes to 1 hour. She notes improvement in sleep while on vacation, especially on the cruise ship, but she reports that sleep remains disrupted at home. She currently takes hydroxyzine  with mirtazapine  nightly and reports that these medications provide minimal benefit. She prefers to avoid additional medications for sleep and considers trying over-the-counter options such as melatonin.  Persistent anxiety, particularly related to work and financial stressors, affects her daily life. She expresses worry about job security and concerns about being behind on bill payments due to illness in September. 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 She continues to engage with her therapist and discusses these issues in therapy.  Motivation and interest fluctuate, with a tendency to stay home rather than socialize with friends and occasional difficulty completing household tasks, especially after long workdays. She finds enjoyment in activities such as watching shows and reports that she enjoyed her recent vacation.  She denies any significant changes in her appetite.  She denies any thoughts of harming herself or others.  She denies any other concerns today.   Visit  Diagnosis:    ICD-10-CM   1. Recurrent major depressive disorder, in partial remission  F33.41     2. Bereavement  Z63.4     3. Generalized anxiety disorder  F41.1       Past Psychiatric History: I have reviewed past psychiatric history from progress note on 11/18/2022.  Past trials of medications like trazodone , zolpidem.  History of suicide attempt in March 2020 involving overdosing on pills.  Past Medical History:  Past Medical History:  Diagnosis Date   Adenomyosis 05/2024   Anxiety    Depression    H/O bilateral breast reduction surgery    History of left oophorectomy 2009   due to ovarian cyst   Lymphadenopathy 2009   left leg   Lymphedema of left leg    S/P tonsillectomy    Suicidal ideation 03/06/2019   Von Willebrand disease (HCC) 2009    Past Surgical History:  Procedure Laterality Date   BREAST REDUCTION SURGERY  2004   LOWER EXTREMITY VENOGRAPHY Left 09/24/2020   Procedure: LOWER EXTREMITY VENOGRAPHY;  Surgeon: Marea Selinda RAMAN, MD;  Location: [facility] INVASIVE CV LAB;  Service: Cardiovascular;  Laterality: Left;   OVARIAN CYST REMOVAL     REDUCTION MAMMAPLASTY     TONSILLECTOMY      Family Psychiatric History: I have reviewed family psychiatric history from progress note on 11/18/2022.  Family History:  Family History  Problem Relation Age of Onset   Depression Mother    Diabetes Mother    Heart attack Father 53       stent placement   Hyperlipidemia Father    Heart disease Father 35   Hypertension Father    Breast cancer Neg Hx  Ovarian cancer Neg Hx    Colon cancer Neg Hx     Social History: I have reviewed social history from progress note on 11/18/2022. Social History   Socioeconomic History   Marital status: Single    Spouse name: Not on file   Number of children: Not on file   Years of education: Not on file   Highest education level: Some college, no degree  Occupational History   Not on file  Tobacco Use   Smoking status: Never    Smokeless tobacco: Never  Vaping Use   Vaping status: Never Used  Substance and Sexual Activity   Alcohol use: Not Currently    Comment: rare   Drug use: No   Sexual activity: Not Currently    Birth control/protection: Pill  Other Topics Concern   Not on file  Social History Narrative   Not on file   Social Drivers of Health   Tobacco Use: Low Risk (01/12/2025)   Patient History    Smoking Tobacco Use: Never    Smokeless Tobacco Use: Never    Passive Exposure: Not on file  Financial Resource Strain: Low Risk (11/07/2022)   Overall Financial Resource Strain (CARDIA)    Difficulty of Paying Living Expenses: Not hard at all  Food Insecurity: No Food Insecurity (11/07/2022)   Hunger Vital Sign    Worried About Running Out of Food in the Last Year: Never true    Ran Out of Food in the Last Year: Never true  Transportation Needs: No Transportation Needs (11/07/2022)   PRAPARE - Administrator, Civil Service (Medical): No    Lack of Transportation (Non-Medical): No  Physical Activity: Not on file  Stress: Not on file  Social Connections: Not on file  Depression (PHQ2-9): Medium Risk (01/12/2025)   Depression (PHQ2-9)    PHQ-2 Score: 8  Alcohol Screen: Low Risk (03/06/2022)   Alcohol Screen    Last Alcohol Screening Score (AUDIT): 1  Housing: Low Risk (11/07/2022)   Housing    Last Housing Risk Score: 0  Utilities: Not At Risk (11/07/2022)   AHC Utilities    Threatened with loss of utilities: No  Health Literacy: Not on file    Allergies: Allergies[1]  Metabolic Disorder Labs: Lab Results  Component Value Date   HGBA1C 5.3 03/08/2019   MPG 105.41 03/08/2019   No results found for: PROLACTIN Lab Results  Component Value Date   CHOL 148 03/08/2019   TRIG 115 03/08/2019   HDL 52 03/08/2019   CHOLHDL 2.8 03/08/2019   VLDL 23 03/08/2019   LDLCALC 73 03/08/2019   Lab Results  Component Value Date   TSH 2.100 11/01/2024   TSH 0.989 11/18/2022     Therapeutic Level Labs: No results found for: LITHIUM No results found for: VALPROATE No results found for: CBMZ  Current Medications: Current Outpatient Medications  Medication Sig Dispense Refill   albuterol  (VENTOLIN  HFA) 108 (90 Base) MCG/ACT inhaler TAKE 2 PUFFS BY MOUTH EVERY 6 HOURS AS NEEDED FOR WHEEZE OR SHORTNESS OF BREATH 3 each 1   cetirizine  (ZYRTEC  ALLERGY) 10 MG tablet Take 1 tablet (10 mg total) by mouth daily. 90 tablet 1   dicyclomine  (BENTYL ) 10 MG capsule Take 1 capsule (10 mg total) by mouth 4 (four) times daily -  before meals and at bedtime. 90 capsule 0   escitalopram  (LEXAPRO ) 10 MG tablet Take 1 tablet (10 mg total) by mouth daily with breakfast. 90 tablet 1   fluticasone  (  FLONASE ) 50 MCG/ACT nasal spray Place 2 sprays into both nostrils daily. 16 g 6   hydrOXYzine  (ATARAX ) 10 MG tablet Take 1 tablet (10 mg total) by mouth 3 (three) times daily as needed for anxiety. 270 tablet 0   mirtazapine  (REMERON ) 30 MG tablet TAKE 1 TABLET BY MOUTH EVERYDAY AT BEDTIME 90 tablet 1   norelgestromin-ethinyl estradiol  (XULANE) 150-35 MCG/24HR transdermal patch Place 1 patch onto the skin once a week.     No current facility-administered medications for this visit.     Musculoskeletal: Strength & Muscle Tone: within normal limits Gait & Station: normal Patient leans: N/A  Psychiatric Specialty Exam: Review of Systems  Psychiatric/Behavioral:  Positive for sleep disturbance. The patient is nervous/anxious.     Blood pressure 120/86, pulse 91, temperature (!) 97.4 F (36.3 C), temperature source Temporal, height 5' 1 (1.549 m), weight 226 lb 12.8 oz (102.9 kg), last menstrual period 12/07/2024.Body mass index is 42.85 kg/m.  General Appearance: Fairly Groomed  Eye Contact:  Fair  Speech:  Clear and Coherent  Volume:  Normal  Mood:  Anxious  Affect:  Appropriate  Thought Process:  Goal Directed and Descriptions of Associations: Intact  Orientation:  Full  (Time, Place, and Person)  Thought Content: Logical   Suicidal Thoughts:  No  Homicidal Thoughts:  No  Memory:  Immediate;   Fair Recent;   Fair Remote;   Fair  Judgement:  Fair  Insight:  Fair  Psychomotor Activity:  Normal  Concentration:  Concentration: Fair and Attention Span: Fair  Recall:  [employer] of Knowledge: Fair  Language: Fair  Akathisia:  No  Handed:  Right  AIMS (if indicated): not done  Assets:  Communication Skills Desire for Improvement Housing Social Support Transportation  ADL's:  Intact  Cognition: WNL  Sleep:  Poor   Screenings: AIMS    Flowsheet Row Admission (Discharged) from 03/07/2019 in [facility]INE  AIMS Total Score 0   AUDIT    Flowsheet Row Admission (Discharged) from 03/07/2019 in [facility] INPATIENT BEH[facility]AL MEDICINE  Alcohol Use Disorder Identification Test Final Score (AUDIT) 0   GAD-7    Flowsheet Row Office Visit from 01/12/2025 in [facility] Office Visit from 09/16/2024 in [facility] at [facility] Office Visit from 08/16/2024 in [facility] Care & [facility] Medicine at [facility] Office Visit from 05/19/2024 in [facility] Office Visit from 12/25/2023 in [facility] Primary Care & [facility] Medicine at [facility]  Total GAD-7 Score 16 9 12 11  0   PHQ2-9    Flowsheet Row Office Visit from 01/12/2025 in [facility] Office Visit from 09/16/2024 in [facility] Primary Care & Sports Medicine at [facility] Office Visit from 08/16/2024 in [facility] Primary Care & Sports Medicine at [facility] Office Visit from 05/19/2024 in [facility] Office Visit from 12/25/2023 in [facility] at [facility]  PHQ-2 Total Score 1 0 4 3 0  PHQ-9 Total Score 8 7 15 14 1    Flowsheet Row  Video Visit from 11/22/2024 in [facility] ED from 11/09/2024 in [facility] Emergency Department at [facility] Video Visit from 09/12/2024 in [facility]  C-SSRS RISK CATEGORY Moderate Risk No Risk Moderate Risk     Assessment and Plan: Mallory Johnston is a  34 year old African-American female who presented for a follow-up appointment, discussed assessment and plan as noted below.  1. Recurrent major depressive disorder, in partial remission Currently reports improvement although with ongoing sleep problems, concentration problems at times. Declines medication changes at this visit. Continue Lexapro  10 mg daily Continue Mirtazapine  30 mg at bedtime Encouraged to work on sleep hygiene techniques. Encouraged to follow-up with psychotherapist.  Continue CBT.  2. Bereavement-improving Currently denies any concerns Will reevaluate in future sessions.  3. Generalized anxiety disorder-improving Ongoing anxiety symptoms mostly situational. Continue hydroxyzine  10 mg 3 times a day as needed Continue Lexapro  and mirtazapine  as prescribed Continue CBT  Follow-up Follow-up in clinic in 2 months or sooner if needed.    Collaboration of Care: Collaboration of Care: Referral or follow-up with counselor/therapist AEB encouraged to continue psychotherapy sessions.  Patient/Guardian was advised Release of Information must be obtained prior to any record release in order to collaborate their care with an outside provider. Patient/Guardian was advised if they have not already done so to contact the registration department to sign all necessary forms in order for us  to release information regarding their care.   Consent: Patient/Guardian gives verbal consent for treatment and assignment of benefits for services provided during this visit. Patient/Guardian expressed understanding and agreed to proceed.   This note  was generated in part or whole with voice recognition software. Voice recognition is usually quite accurate but there are transcription errors that can and very often do occur. I apologize for any typographical errors that were not detected and corrected.    Lachrista Heslin, MD 01/12/2025, 4:39 PM     [1]  Allergies Allergen Reactions   Salmon [Fish Oil]    Amoxicillin Rash and Hives   Latex Rash   Percocet [Oxycodone-Acetaminophen ] Nausea And Vomiting and Rash

## 2025-01-14 LAB — EXERCISE TOLERANCE TEST
Angina Index: 0
Duke Treadmill Score: 6
Estimated workload: 7
Exercise duration (min): 6 min
Exercise duration (sec): 0 s
MPHR: 185 {beats}/min
Peak HR: 164 {beats}/min
Percent HR: 88 %
RPE: 17
Rest HR: 96 {beats}/min
ST Depression (mm): 0 mm

## 2025-03-14 ENCOUNTER — Ambulatory Visit: Admitting: Psychiatry

## 2025-04-04 ENCOUNTER — Ambulatory Visit: Admitting: Family Medicine
